# Patient Record
Sex: Male | Born: 1960 | Race: White | Hispanic: No | Marital: Married | Smoking: Never smoker
Health system: Southern US, Community
[De-identification: ages and names within clinical notes are randomized; demographics above are authoritative.]

## PROBLEM LIST (undated history)

## (undated) DIAGNOSIS — I10 Essential (primary) hypertension: Secondary | ICD-10-CM

## (undated) DIAGNOSIS — F0280 Dementia in other diseases classified elsewhere without behavioral disturbance: Secondary | ICD-10-CM

## (undated) DIAGNOSIS — G309 Alzheimer's disease, unspecified: Secondary | ICD-10-CM

## (undated) DIAGNOSIS — F419 Anxiety disorder, unspecified: Secondary | ICD-10-CM

## (undated) HISTORY — DX: Alzheimer's disease, unspecified: G30.9

## (undated) HISTORY — DX: Dementia in other diseases classified elsewhere, unspecified severity, without behavioral disturbance, psychotic disturbance, mood disturbance, and anxiety: F02.80

## (undated) HISTORY — DX: Anxiety disorder, unspecified: F41.9

## (undated) HISTORY — DX: Essential (primary) hypertension: I10

## (undated) HISTORY — PX: PROSTATE SURGERY: SHX751

---

## 1978-01-27 HISTORY — PX: AMPUTATION: SHX166

## 2004-11-10 ENCOUNTER — Ambulatory Visit: Payer: Self-pay | Admitting: General Practice

## 2009-10-30 ENCOUNTER — Ambulatory Visit: Payer: Self-pay | Admitting: Gastroenterology

## 2011-02-21 ENCOUNTER — Ambulatory Visit: Payer: Self-pay | Admitting: General Practice

## 2011-03-19 ENCOUNTER — Other Ambulatory Visit: Payer: Self-pay | Admitting: Neurosurgery

## 2011-03-19 ENCOUNTER — Ambulatory Visit
Admission: RE | Admit: 2011-03-19 | Discharge: 2011-03-19 | Disposition: A | Discharge status: Home / Self Care | Payer: BC Managed Care – PPO | Source: Ambulatory Visit | Attending: Neurosurgery | Admitting: Neurosurgery

## 2011-03-19 DIAGNOSIS — M5000 Cervical disc disorder with myelopathy, unspecified cervical region: Secondary | ICD-10-CM

## 2012-07-19 DIAGNOSIS — I10 Essential (primary) hypertension: Secondary | ICD-10-CM | POA: Insufficient documentation

## 2012-07-19 DIAGNOSIS — R413 Other amnesia: Secondary | ICD-10-CM | POA: Insufficient documentation

## 2013-02-03 DIAGNOSIS — G3184 Mild cognitive impairment, so stated: Secondary | ICD-10-CM | POA: Insufficient documentation

## 2017-11-17 ENCOUNTER — Ambulatory Visit: Payer: BLUE CROSS/BLUE SHIELD | Admitting: Urology

## 2017-11-17 ENCOUNTER — Encounter: Payer: Self-pay | Admitting: Urology

## 2017-11-17 VITALS — BP 142/81 | HR 59 | Ht 73.0 in | Wt 317.3 lb

## 2017-11-17 DIAGNOSIS — N138 Other obstructive and reflux uropathy: Secondary | ICD-10-CM

## 2017-11-17 DIAGNOSIS — N401 Enlarged prostate with lower urinary tract symptoms: Secondary | ICD-10-CM

## 2017-11-17 DIAGNOSIS — R350 Frequency of micturition: Secondary | ICD-10-CM

## 2017-11-17 DIAGNOSIS — N3941 Urge incontinence: Secondary | ICD-10-CM

## 2017-11-17 LAB — BLADDER SCAN AMB NON-IMAGING: Scan Result: 0

## 2017-11-17 MED ORDER — MIRABEGRON ER 25 MG PO TB24: 25.0000 mg | ORAL_TABLET | Freq: Every day | ORAL | 0 refills | Status: DC

## 2017-11-17 NOTE — Progress Notes (Signed)
11/17/2017 3:36 PM   Nathan Schroeder [birth year] [id number]  Referring provider: Gilles Chiquito, MD [address] [place], [place] [address]  Chief Complaint  Patient presents with  . Urinary Frequency    HPI: Patient is a 57 year old Caucasian male with early onset Alzheimer's dementia who is referred by Nona Dell, PA for frequency and voiding small amounts with his wife, Elease Hashimoto.    [name] wife states that as he has Alzheimer's sometimes it is difficult to figure out what the real complaints may be.  She states that she has noted frequency of urination, urgency of urination and a weak urinary stream.  She states that this is been occurring for years.  She has not noted anything that makes it better or worse.  Patient denies any gross hematuria, dysuria or suprapubic/flank pain.  Patient denies any fevers, chills, nausea or vomiting.   His PVR is 0 mL.         PMH: Past Medical History:  Diagnosis Date  . Alzheimer's disease (HCC)   . Anxiety   . 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 Always use your most recent med list.          buPROPion 150 MG 24 hr tablet Commonly known as:  WELLBUTRIN XL TK 1 T PO QAM   donepezil 10 MG tablet Commonly known as:  ARICEPT TAKE 1 TABLET PO ONCE DAILY   lisinopril 20 MG tablet Commonly known as:  PRINIVIL,ZESTRIL TAKE 1 TABLET BY MOUTH ONCE EVERY MORNING   memantine 28 MG Cp24 24 hr capsule Commonly known as:  NAMENDA XR TK 1 C PO QD   mirabegron ER 25 MG Tb24 tablet Commonly known as:  MYRBETRIQ Take 1 tablet (25 mg total) by mouth daily.   oxybutynin 5 MG 24 hr tablet Commonly known as:  DITROPAN-XL TK 1 T PO QD       Allergies: Not on File  Family History: No family history on file.  Social History:  reports that he has never  smoked. He has quit using smokeless tobacco.  His smokeless tobacco use included snuff. He reports that he drinks about 2.0 standard drinks of alcohol per week. His drug history is not on file.  ROS: UROLOGY Frequent Urination?: Yes Hard to postpone urination?: Yes Burning/pain with urination?: No Get up at night to urinate?: No Leakage of urine?: Yes(sometimes) Urine stream starts and stops?: Yes(sometimes) Trouble starting stream?: Yes(sometimes) Do you have to strain to urinate?: Yes(sometimes) Blood in urine?: No Urinary tract infection?: No Sexually transmitted disease?: No Injury to kidneys or bladder?: No Painful intercourse?: No Weak stream?: No Erection problems?: No Penile pain?: No  Gastrointestinal Nausea?: No Vomiting?: No Indigestion/heartburn?: No Diarrhea?: Yes Constipation?: Yes  Constitutional Fever: No Night sweats?: No Weight loss?: No Fatigue?: No  Skin Skin rash/lesions?: No Itching?: No  Eyes Blurred vision?: Yes Double vision?: No  Ears/Nose/Throat Sore throat?: No Sinus problems?: No  Hematologic/Lymphatic Swollen glands?: No Easy bruising?: No  Cardiovascular Leg swelling?: No Chest pain?: No  Respiratory Cough?: No Shortness of breath?: No  Endocrine Excessive thirst?: No  Musculoskeletal Back pain?: No Joint pain?: No  Neurological Headaches?: No Dizziness?: No  Psychologic Depression?: Yes Anxiety?: Yes  Physical Exam: BP (!) 142/81 (  BP Location: Left Arm, Patient Position: Sitting, Cuff Size: Normal)   Pulse (!) 59   Ht 6\' 1"  (1.854 m)   Wt (!) 317 lb 4.8 oz (143.9 kg)   BMI 41.86 kg/m   Constitutional:  Well nourished. Alert and oriented, No acute distress. HEENT: [place] AT, moist mucus membranes.  Trachea midline, no masses. Cardiovascular: No clubbing, cyanosis, or edema. Respiratory: Normal respiratory effort, no increased work of breathing. GI: Abdomen is soft, non tender, non distended, no abdominal  masses. Liver and spleen not palpable.  No hernias appreciated.  Stool sample for occult testing is not indicated.   GU: No CVA tenderness.  No bladder fullness or masses.  Patient with circumcised phallus.  Urethral meatus is patent.  No penile discharge. No penile lesions or rashes. Scrotum without lesions, cysts, rashes and/or edema.  Testicles are located scrotally bilaterally. No masses are appreciated in the testicles. Left and right epididymis are normal. Rectal: Patient with  normal sphincter tone. Anus and perineum without scarring or rashes. No rectal masses are appreciated. Prostate is approximately 55 grams, could only palpate the apex, no  nodules are appreciated.  Skin: No rashes, bruises or suspicious lesions. Lymph: No cervical or inguinal adenopathy. Neurologic: Grossly intact, no focal deficits, moving all 4 extremities. Psychiatric: Normal mood and affect.  Laboratory Data: No results found for: WBC, HGB, HCT, MCV, PLT  No results found for: CREATININE  No results found for: PSA  No results found for: TESTOSTERONE  No results found for: HGBA1C  No results found for: TSH  No results found for: CHOL, HDL, CHOLHDL, VLDL, LDLCALC  No results found for: AST No results found for: ALT No components found for: ALKALINEPHOPHATASE No components found for: BILIRUBINTOTAL  No results found for: ESTRADIOL  Urinalysis No results found for: COLORURINE, APPEARANCEUR, LABSPEC, PHURINE, GLUCOSEU, HGBUR, BILIRUBINUR, KETONESUR, PROTEINUR, UROBILINOGEN, NITRITE, LEUKOCYTESUR  I have reviewed the labs.   Pertinent Imaging: Results for Nathan Schroeder, Nathan Schroeder (MRN [id number]) as of 11/29/2017 21:05  Ref. Range 11/17/2017 15:23  Scan Result Unknown 0    Assessment & Plan:    1. BPH with LUTS Continue conservative management, avoiding bladder irritants and timed voiding's Most bothersome symptoms is/are frequency and urgency RTC in one month for I PSS and PVR  2. Urinary  frequency - Urinalysis, Complete -patient could not provide specimen will collect at return appointment - Bladder Scan (Post Void Residual) in office  3. Urgency Will discontinue the oxybutynin at this time as 1 of its main side effect is cognitive defects Offered medical therapy with beta-3 adrenergic receptor agonist - would like to try the beta-3 adrenergic receptor agonist (Myrbetriq).  Given Myrbetriq 25 mg samples, #28.  I have reviewed with the patient of the side effects of Myrbetriq, such as: elevation in BP, urinary retention and/or HA.   RTC in 3 weeks for PVR and symptom recheck  Return in about 3 weeks (around 12/08/2017) for IPSS and PVR.  These notes generated with voice recognition software. I apologize for typographical errors.  Michiel Cowboy, PA-C  [facility] [address]  [address] [place], [place] [address] [phone]

## 2017-12-08 ENCOUNTER — Encounter: Payer: Self-pay | Admitting: Urology

## 2017-12-08 ENCOUNTER — Ambulatory Visit: Payer: BLUE CROSS/BLUE SHIELD | Admitting: Urology

## 2017-12-08 VITALS — BP 145/84 | HR 53 | Ht 73.0 in | Wt 319.8 lb

## 2017-12-08 DIAGNOSIS — R350 Frequency of micturition: Secondary | ICD-10-CM

## 2017-12-08 DIAGNOSIS — N401 Enlarged prostate with lower urinary tract symptoms: Secondary | ICD-10-CM | POA: Diagnosis not present

## 2017-12-08 DIAGNOSIS — N3941 Urge incontinence: Secondary | ICD-10-CM | POA: Diagnosis not present

## 2017-12-08 DIAGNOSIS — N138 Other obstructive and reflux uropathy: Secondary | ICD-10-CM

## 2017-12-08 LAB — BLADDER SCAN AMB NON-IMAGING: Scan Result: 11

## 2017-12-08 NOTE — Progress Notes (Signed)
12/08/2017 3:08 PM   Nathan Schroeder 03/03/1960 [id number]  Referring provider: No referring provider defined for this encounter.  Chief Complaint  Patient presents with  . Follow-up    HPI: Patient is a 57 year old Caucasian male with BPH with LU TS, urgency and frequency who presents today for a three week follow up after a trial of Myrbetriq.    Background history Patient is a 57 year old Caucasian male with early onset Alzheimer's dementia who is referred by Nathan Dellonald Smith, PA for frequency and voiding small amounts with his wife, Nathan Hashimotoatricia.  [name] wife states that as he has Alzheimer's sometimes it is difficult to figure out what the real complaints may be.  She states that she has noted frequency of urination, urgency of urination and a weak urinary stream.  She states that this is been occurring for years.  She has not noted anything that makes it better or worse.  Patient denies any gross hematuria, dysuria or suprapubic/flank pain.  Patient denies any fevers, chills, nausea or vomiting.  His PVR is 0 mL.    Today, his UA is negative.    PVR: 11 mL   Previous PVR: 0 mL  Major complaint(s):  Frequency.  His wife states that they have not noticed any change in his urinary symptoms since the start of the Myrbetriq samples.  Denies any dysuria, hematuria or suprapubic pain.   Denies any recent fevers, chills, nausea or vomiting.  Wife feels that the frequency with urination is due to anxiety as he does not exhibit the symptoms as much as what he is not familiar surroundings.  She states that whenever they visit a new place, he has to visit the restroom constantly.     IPSS    Row Name 12/08/17 1400         International Prostate Symptom Score   How often have you had to urinate less than every two hours?  Almost always     How often have you found it difficult to postpone urination?  Not at All     How many times did you typically get up at night to urinate?  1 Time     Total IPSS Score  6        Score:  1-7 Mild 8-19 Moderate 20-35 Severe        PMH: Past Medical History:  Diagnosis Date  . Alzheimer's disease (HCC)   . Anxiety   . 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 Always use your most recent med list.          buPROPion 150 MG 24 hr tablet Commonly known as:  WELLBUTRIN XL TK 1 T PO QAM   donepezil 10 MG tablet Commonly known as:  ARICEPT TAKE 1 TABLET PO ONCE DAILY   lisinopril 20 MG tablet Commonly known as:  PRINIVIL,ZESTRIL TAKE 1 TABLET BY MOUTH ONCE EVERY MORNING   memantine 28 MG Cp24 24 hr capsule Commonly known as:  NAMENDA XR TK 1 C PO QD   mirabegron ER 25 MG Tb24 tablet Commonly known as:  MYRBETRIQ Take 1 tablet (25 mg total) by mouth daily.       Allergies: Not on File  Family History: No family history on  file.  Social History:  reports that he has never smoked. He has quit using smokeless tobacco.  His smokeless tobacco use included snuff. He reports that he drinks about 2.0 standard drinks of alcohol per week. His drug history is not on file.  ROS: UROLOGY Frequent Urination?: Yes Hard to postpone urination?: No Burning/pain with urination?: No Get up at night to urinate?: No Leakage of urine?: No Urine stream starts and stops?: No Trouble starting stream?: No Do you have to strain to urinate?: No Blood in urine?: No Urinary tract infection?: No Sexually transmitted disease?: No Injury to kidneys or bladder?: No Painful intercourse?: No Weak stream?: No Erection problems?: No Penile pain?: No  Gastrointestinal Nausea?: No Vomiting?: No Indigestion/heartburn?: No Diarrhea?: Yes Constipation?: No  Constitutional Fever: No Night sweats?: No Weight loss?: No Fatigue?: No  Skin Skin rash/lesions?:  No Itching?: No  Eyes Blurred vision?: No Double vision?: No  Ears/Nose/Throat Sore throat?: No Sinus problems?: No  Hematologic/Lymphatic Swollen glands?: No Easy bruising?: No  Cardiovascular Leg swelling?: No Chest pain?: No  Respiratory Cough?: No Shortness of breath?: No  Endocrine Excessive thirst?: No  Musculoskeletal Back pain?: No Joint pain?: No  Neurological Headaches?: No Dizziness?: No  Psychologic Depression?: No Anxiety?: No  Physical Exam: BP (!) 145/84 (BP Location: Left Arm, Patient Position: Sitting, Cuff Size: Large)   Pulse (!) 53   Ht 6\' 1"  (1.854 m)   Wt (!) 319 lb 12.8 oz (145.1 kg)   BMI 42.19 kg/m   Constitutional: Well nourished. Alert and oriented, No acute distress. HEENT: [place] AT, moist mucus membranes. Trachea midline, no masses. Cardiovascular: No clubbing, cyanosis, or edema. Respiratory: Normal respiratory effort, no increased work of breathing. Skin: No rashes, bruises or suspicious lesions. Lymph: No cervical or inguinal adenopathy. Neurologic: Grossly intact, no focal deficits, moving all 4 extremities. Psychiatric: Normal mood and affect.  Laboratory Data: No results found for: WBC, HGB, HCT, MCV, PLT  No results found for: CREATININE  No results found for: PSA  No results found for: TESTOSTERONE  No results found for: HGBA1C  No results found for: TSH  No results found for: CHOL, HDL, CHOLHDL, VLDL, LDLCALC  No results found for: AST No results found for: ALT No components found for: ALKALINEPHOPHATASE No components found for: BILIRUBINTOTAL  No results found for: ESTRADIOL  Urinalysis No results found for: COLORURINE, APPEARANCEUR, LABSPEC, PHURINE, GLUCOSEU, HGBUR, BILIRUBINUR, KETONESUR, PROTEINUR, UROBILINOGEN, NITRITE, LEUKOCYTESUR  Schroeder have reviewed the labs.   Pertinent Imaging: Results for Nathan Schroeder (MRN [id number]) as of 12/08/2017 15:13  Ref. Range 12/08/2017 14:16  Scan Result  Unknown 11    Assessment & Plan:    1. BPH with LU TS Continue conservative management, avoiding bladder irritants and timed voiding's Most bothersome symptoms is/are frequency and urgency - no improvement with Myrbetriq Will schedule cystoscopically to rule out any strictures or anatomical variant that may contribute to his urinary urgency/frequency Schroeder have explained to the patient that they will  be scheduled for a cystoscopy in our office to evaluate their bladder.  The cystoscopy consists of passing a tube with a lens up through their urethra and into their urinary bladder.   We will inject the urethra with a lidocaine gel prior to introducing the cystoscope to help with any discomfort during the procedure.   After the procedure, they might experience blood in the urine and discomfort with urination.  This will abate after the first few voids.  Schroeder have  encouraged  the patient to increase water intake  during this time.  Patient denies any allergies to lidocaine.    2. Urinary frequency - Urinalysis - negative we will send for culture to rule out indolent infection - Bladder Scan (Post Void Residual) in office  3. Urgency See above  Return for cystoscopy for refractory urgency .  These notes generated with voice recognition software. Schroeder apologize for typographical errors.  Michiel Cowboy, PA-C  [facility] [address]  [address] [place], [place] [address] [phone]

## 2017-12-09 LAB — URINALYSIS, COMPLETE
Bilirubin, UA: NEGATIVE
Glucose, UA: NEGATIVE
Ketones, UA: NEGATIVE
LEUKOCYTES UA: NEGATIVE
Nitrite, UA: NEGATIVE
Protein, UA: NEGATIVE
RBC, UA: NEGATIVE
Specific Gravity, UA: 1.025 (ref 1.005–1.030)
Urobilinogen, Ur: 0.2 mg/dL (ref 0.2–1.0)
pH, UA: 5.5 (ref 5.0–7.5)

## 2017-12-09 LAB — MICROSCOPIC EXAMINATION
BACTERIA UA: NONE SEEN
EPITHELIAL CELLS (NON RENAL): NONE SEEN /HPF (ref 0–10)
WBC UA: NONE SEEN /HPF (ref 0–5)

## 2017-12-22 ENCOUNTER — Ambulatory Visit (INDEPENDENT_AMBULATORY_CARE_PROVIDER_SITE_OTHER): Payer: BLUE CROSS/BLUE SHIELD | Admitting: Urology

## 2017-12-22 ENCOUNTER — Encounter: Payer: Self-pay | Admitting: Urology

## 2017-12-22 VITALS — BP 138/84 | HR 61 | Ht 73.0 in | Wt 320.4 lb

## 2017-12-22 DIAGNOSIS — R350 Frequency of micturition: Secondary | ICD-10-CM

## 2017-12-22 LAB — URINALYSIS, COMPLETE
BILIRUBIN UA: NEGATIVE
GLUCOSE, UA: NEGATIVE
Ketones, UA: NEGATIVE
Leukocytes, UA: NEGATIVE
Nitrite, UA: NEGATIVE
PROTEIN UA: NEGATIVE
RBC UA: NEGATIVE
UUROB: 0.2 mg/dL (ref 0.2–1.0)
pH, UA: 5.5 (ref 5.0–7.5)

## 2017-12-22 LAB — MICROSCOPIC EXAMINATION
EPITHELIAL CELLS (NON RENAL): NONE SEEN /HPF (ref 0–10)
WBC UA: NONE SEEN /HPF (ref 0–5)

## 2017-12-22 MED ORDER — LIDOCAINE HCL URETHRAL/MUCOSAL 2 % EX GEL: 1.0000 "application " | Freq: Once | CUTANEOUS | Status: DC

## 2017-12-22 MED ORDER — MIRABEGRON ER 50 MG PO TB24: 50.0000 mg | ORAL_TABLET | Freq: Every day | ORAL | 0 refills | Status: DC

## 2017-12-22 NOTE — Progress Notes (Signed)
   12/22/17  CC: No chief complaint on file.   HPI: 57 year old male with early onset Alzheimer's in urinary frequency and urgency.  Refer to [facility]annon McGowan's previous notes.  He remains on Myrbetriq 25 mg with slight improvement in his symptoms.  There were no vitals taken for this visit. NED. A&Ox3.   No respiratory distress   Abd soft, NT, ND Normal phallus with bilateral descended testicles  Cystoscopy Procedure Note  Patient identification was confirmed, informed consent was obtained, and patient was prepped using Betadine solution.  Lidocaine jelly was administered per urethral meatus.     Pre-Procedure: - Inspection reveals a normal caliber ureteral meatus.  Procedure: The flexible cystoscope was introduced without difficulty - No urethral strictures/lesions are present. - Nonocclusive prostate with a short prostatic urethra - Normal bladder neck - Bilateral ureteral orifices identified - Bladder mucosa  reveals no ulcers, tumors, or lesions - No bladder stones - No trabeculation  Retroflexion shows no abnormalities.   Post-Procedure: - Patient tolerated the procedure well  Assessment/ Plan: No significant abnormalities identified on cystoscopy.  His wife thinks majority of his symptoms are related to anxiety.  Would avoid anticholinergic medication in this patient with early onset Alzheimer's.  Will increase the Myrbetriq to 50 mg.  He was given samples and an Rx was sent to his pharmacy.   Riki AltesScott C Samarah Hogle, MD

## 2018-06-22 ENCOUNTER — Ambulatory Visit: Payer: BLUE CROSS/BLUE SHIELD | Admitting: Urology

## 2018-07-01 NOTE — Progress Notes (Signed)
07/02/2018 9:45 AM   Nathan Schroeder 10/27/1960 [id number]  Referring provider: No referring provider defined for this encounter.  Chief Complaint  Patient presents with  . Urinary Frequency    Follow up    HPI: Patient is a 58 year old Caucasian male with BPH with LU TS, urgency and frequency who presents today for a follow up.      Background history Patient is a 58 year old Caucasian male with early onset Alzheimer's dementia who is referred by Nathan Dellonald Smith, PA for frequency and voiding small amounts with his wife, Nathan Schroeder.  [name] wife states that as he has Alzheimer's sometimes it is difficult to figure out what the real complaints may be.  She states that she has noted frequency of urination, urgency of urination and a weak urinary stream.  She states that this is been occurring for years.  She has not noted anything that makes it better or worse.    PVR: 10 mL   Previous PVR: 11 mL  Major complaint(s):  Frequency.  His wife states that they have not noticed any change in his urinary symptoms since the start of the Myrbetriq samples.  Denies any dysuria, hematuria or suprapubic pain.   Denies any recent fevers, chills, nausea or vomiting.  Cystoscopy was NED.    Wife feels that the frequency with urination is due to anxiety as he does not exhibit the symptoms as much as what he is not familiar surroundings.  She states that whenever they visit a new place, he has to visit the restroom constantly.    He was given Myrbetriq 50 mg daily after his cystoscopy appointment in November 2019.  His wife states he is no longer on the Myrbetriq and his urinary symptoms have greatly improved on their own.   His PVR today is 10 mL  Patient denies any gross hematuria, dysuria or suprapubic/flank pain.  Patient denies any fevers, chills, nausea or vomiting.   PMH: Past Medical History:  Diagnosis Date  . Alzheimer's disease (HCC)   . Anxiety   . 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 If you have any questions, ask your nurse or doctor.        STOP taking these medications   mirabegron ER 50 MG Tb24 tablet Commonly known as:  MYRBETRIQ Stopped by:  Nathan Schroeder   Urea 20 Intensive Hydrating 20 % cream Generic drug:  urea Stopped by:  Nathan Schroeder     TAKE these medications   buPROPion 300 MG 24 hr tablet Commonly known as:  WELLBUTRIN XL What changed:  Another medication with the same name was removed. Continue taking this medication, and follow the directions you see here. Changed by:  Nathan Schroeder   donepezil 10 MG tablet Commonly known as:  ARICEPT TAKE 1 TABLET PO ONCE DAILY   lisinopril 20 MG tablet Commonly known as:  ZESTRIL TAKE 1 TABLET BY MOUTH ONCE EVERY MORNING   memantine 28 MG Cp24 24 hr capsule Commonly known as:  NAMENDA XR TK 1 C PO QD       Allergies: No Known Allergies  Family History: No family history on file.  Social History:  reports that he has never smoked. He has quit  using smokeless tobacco.  His smokeless tobacco use included snuff. He reports current alcohol use of about 2.0 standard drinks of alcohol per week. No history on file for drug.  ROS: UROLOGY Frequent Urination?: No Hard to postpone urination?: No Burning/pain with urination?: No Get up at night to urinate?: No Leakage of urine?: No Urine stream starts and stops?: No Trouble starting stream?: No Do you have to strain to urinate?: No Blood in urine?: No Urinary tract infection?: No Sexually transmitted disease?: No Injury to kidneys or bladder?: No Painful intercourse?: No Weak stream?: No Erection problems?: No Penile pain?: No  Gastrointestinal Nausea?: No Vomiting?: No Indigestion/heartburn?: No Diarrhea?: No Constipation?: No   Constitutional Fever: No Night sweats?: No Weight loss?: No Fatigue?: No  Skin Skin rash/lesions?: No Itching?: No  Eyes Blurred vision?: No Double vision?: No  Ears/Nose/Throat Sore throat?: No Sinus problems?: No  Hematologic/Lymphatic Swollen glands?: No Easy bruising?: No  Cardiovascular Leg swelling?: No Chest pain?: No  Respiratory Cough?: No Shortness of breath?: No  Endocrine Excessive thirst?: No  Musculoskeletal Back pain?: No Joint pain?: No  Neurological Headaches?: No Dizziness?: No  Psychologic Depression?: No Anxiety?: No  Physical Exam: BP 136/74   Ht 6\' 1"  (1.854 m)   Wt (!) 321 lb (145.6 kg)   BMI 42.35 kg/m   Constitutional:  Well nourished. Alert and oriented, No acute distress. HEENT: [place] AT, moist mucus membranes.  Trachea midline, no masses. Cardiovascular: No clubbing, cyanosis, or edema. Respiratory: Normal respiratory effort, no increased work of breathing. Neurologic: Grossly intact, no focal deficits, moving all 4 extremities. Psychiatric: Normal mood and affect.  Laboratory Data: No results found for: WBC, HGB, HCT, MCV, PLT  No results found for: CREATININE  No results found for: PSA  No results found for: TESTOSTERONE  No results found for: HGBA1C  No results found for: TSH  No results found for: CHOL, HDL, CHOLHDL, VLDL, LDLCALC  No results found for: AST No results found for: ALT No components found for: ALKALINEPHOPHATASE No components found for: BILIRUBINTOTAL  No results found for: ESTRADIOL  Urinalysis    Component Value Date/Time   APPEARANCEUR Clear 12/22/2017 1511   GLUCOSEU Negative 12/22/2017 1511   BILIRUBINUR Negative 12/22/2017 1511   PROTEINUR Negative 12/22/2017 1511   NITRITE Negative 12/22/2017 1511   LEUKOCYTESUR Negative 12/22/2017 1511    I have reviewed the labs.   Pertinent Imaging: Results for Nathan Schroeder, Nathan Schroeder (Nathan Schroeder) as of 07/05/2018 09:50  Ref. Range 07/02/2018  10:24  Scan Result Unknown 56ml   Assessment & Plan:    1. BPH with LU TS Nathan Schroeder does not have any urinary complaints at this visit PVR is low  2. Urinary frequency Bladder Scan (Post Void Residual) in office Resolved  3. Urgency See above  Return if symptoms worsen or fail to improve.  These notes generated with voice recognition software. I apologize for typographical errors.  Michiel Cowboy, Schroeder  [facility] [address]  [address] [place], [place] [address] [phone]

## 2018-07-02 ENCOUNTER — Other Ambulatory Visit: Payer: Self-pay

## 2018-07-02 ENCOUNTER — Ambulatory Visit: Payer: BC Managed Care – PPO | Admitting: Urology

## 2018-07-02 ENCOUNTER — Encounter: Payer: Self-pay | Admitting: Urology

## 2018-07-02 VITALS — BP 136/74 | Ht 73.0 in | Wt 321.0 lb

## 2018-07-02 DIAGNOSIS — R350 Frequency of micturition: Secondary | ICD-10-CM

## 2018-07-02 DIAGNOSIS — N401 Enlarged prostate with lower urinary tract symptoms: Secondary | ICD-10-CM | POA: Diagnosis not present

## 2018-07-02 DIAGNOSIS — N3941 Urge incontinence: Secondary | ICD-10-CM

## 2018-07-02 DIAGNOSIS — N138 Other obstructive and reflux uropathy: Secondary | ICD-10-CM

## 2018-07-02 LAB — BLADDER SCAN AMB NON-IMAGING

## 2018-09-22 DIAGNOSIS — Z79899 Other long term (current) drug therapy: Secondary | ICD-10-CM | POA: Diagnosis not present

## 2018-09-22 DIAGNOSIS — R69 Illness, unspecified: Secondary | ICD-10-CM | POA: Diagnosis not present

## 2018-09-22 DIAGNOSIS — F0390 Unspecified dementia without behavioral disturbance: Secondary | ICD-10-CM | POA: Diagnosis not present

## 2018-09-22 DIAGNOSIS — E569 Vitamin deficiency, unspecified: Secondary | ICD-10-CM | POA: Diagnosis not present

## 2018-09-22 DIAGNOSIS — R413 Other amnesia: Secondary | ICD-10-CM | POA: Diagnosis not present

## 2018-10-07 ENCOUNTER — Other Ambulatory Visit: Payer: Self-pay

## 2018-10-07 MED ORDER — UREA 20 % EX CREA: TOPICAL_CREAM | CUTANEOUS | 0 refills | Status: DC

## 2018-10-28 ENCOUNTER — Ambulatory Visit: Payer: Self-pay

## 2018-10-28 DIAGNOSIS — Z23 Encounter for immunization: Secondary | ICD-10-CM

## 2018-11-25 ENCOUNTER — Other Ambulatory Visit: Payer: Self-pay

## 2018-11-25 DIAGNOSIS — R234 Changes in skin texture: Secondary | ICD-10-CM

## 2018-11-26 MED ORDER — UREA 20 % EX CREA: TOPICAL_CREAM | CUTANEOUS | 1 refills | Status: DC

## 2019-01-07 ENCOUNTER — Other Ambulatory Visit: Payer: Self-pay

## 2019-01-07 ENCOUNTER — Other Ambulatory Visit: Payer: Self-pay | Admitting: Physician Assistant

## 2019-01-07 DIAGNOSIS — I10 Essential (primary) hypertension: Secondary | ICD-10-CM

## 2019-01-07 MED ORDER — LISINOPRIL 20 MG PO TABS: 20.0000 mg | ORAL_TABLET | Freq: Every day | ORAL | 0 refills | Status: DC

## 2019-01-25 ENCOUNTER — Ambulatory Visit: Payer: Managed Care, Other (non HMO) | Attending: Internal Medicine

## 2019-01-25 DIAGNOSIS — Z20828 Contact with and (suspected) exposure to other viral communicable diseases: Secondary | ICD-10-CM | POA: Diagnosis not present

## 2019-01-25 DIAGNOSIS — Z20822 Contact with and (suspected) exposure to covid-19: Secondary | ICD-10-CM

## 2019-01-26 LAB — NOVEL CORONAVIRUS, NAA: SARS-CoV-2, NAA: NOT DETECTED

## 2019-01-31 ENCOUNTER — Other Ambulatory Visit: Payer: Managed Care, Other (non HMO)

## 2019-02-28 ENCOUNTER — Other Ambulatory Visit: Payer: Self-pay

## 2019-02-28 ENCOUNTER — Other Ambulatory Visit: Payer: Self-pay | Admitting: Physician Assistant

## 2019-02-28 ENCOUNTER — Ambulatory Visit: Payer: Managed Care, Other (non HMO) | Attending: Internal Medicine

## 2019-02-28 DIAGNOSIS — U071 COVID-19: Secondary | ICD-10-CM

## 2019-02-28 DIAGNOSIS — R234 Changes in skin texture: Secondary | ICD-10-CM

## 2019-02-28 DIAGNOSIS — Z20822 Contact with and (suspected) exposure to covid-19: Secondary | ICD-10-CM

## 2019-02-28 MED ORDER — UREA 20 % EX CREA: TOPICAL_CREAM | CUTANEOUS | 1 refills | Status: DC

## 2019-03-01 LAB — NOVEL CORONAVIRUS, NAA: SARS-CoV-2, NAA: NOT DETECTED

## 2019-03-28 ENCOUNTER — Other Ambulatory Visit: Payer: Self-pay

## 2019-03-28 DIAGNOSIS — I10 Essential (primary) hypertension: Secondary | ICD-10-CM

## 2019-03-29 MED ORDER — LISINOPRIL 20 MG PO TABS: 20.0000 mg | ORAL_TABLET | Freq: Every day | ORAL | 0 refills | Status: DC

## 2019-04-05 NOTE — Progress Notes (Signed)
Patient comes in today for pre physical labs and EKG. Patient is scheduled with Albina Billet, PA-C on 04/13/19.

## 2019-04-06 ENCOUNTER — Other Ambulatory Visit: Payer: Self-pay

## 2019-04-06 ENCOUNTER — Ambulatory Visit: Payer: Self-pay

## 2019-04-06 DIAGNOSIS — Z0000 Encounter for general adult medical examination without abnormal findings: Secondary | ICD-10-CM

## 2019-04-06 NOTE — Progress Notes (Signed)
Presents for labs & EKG accompanied by wife Alexia Freestone Tillar).  EKG reviewed by Durward Parcel, PA-C (Interim Provider).  AMD

## 2019-04-07 LAB — CMP12+LP+TP+TSH+6AC+PSA+CBC…
ALT: 34 IU/L (ref 0–44)
AST: 32 IU/L (ref 0–40)
Albumin/Globulin Ratio: 1.4 (ref 1.2–2.2)
Albumin: 4.4 g/dL (ref 3.8–4.9)
Alkaline Phosphatase: 69 IU/L (ref 39–117)
BUN/Creatinine Ratio: 16 (ref 10–24)
BUN: 14 mg/dL (ref 8–27)
Basophils Absolute: 0 10*3/uL (ref 0.0–0.2)
Basos: 0 %
Bilirubin Total: 0.9 mg/dL (ref 0.0–1.2)
Calcium: 9.1 mg/dL (ref 8.6–10.2)
Chloride: 102 mmol/L (ref 96–106)
Chol/HDL Ratio: 4.7 ratio (ref 0.0–5.0)
Cholesterol, Total: 199 mg/dL (ref 100–199)
Creatinine, Ser: 0.89 mg/dL (ref 0.76–1.27)
EOS (ABSOLUTE): 0.1 10*3/uL (ref 0.0–0.4)
Eos: 1 %
Estimated CHD Risk: 1 times avg. (ref 0.0–1.0)
Free Thyroxine Index: 1.7 (ref 1.2–4.9)
GFR calc Af Amer: 107 mL/min/{1.73_m2} (ref >59)
GFR calc non Af Amer: 93 mL/min/{1.73_m2} (ref >59)
GGT: 22 IU/L (ref 0–65)
Globulin, Total: 3.1 g/dL (ref 1.5–4.5)
Glucose: 99 mg/dL (ref 65–99)
HDL: 42 mg/dL (ref >39)
Hematocrit: 45.4 % (ref 37.5–51.0)
Hemoglobin: 16.2 g/dL (ref 13.0–17.7)
Immature Grans (Abs): 0 10*3/uL (ref 0.0–0.1)
Immature Granulocytes: 0 %
Iron: 219 ug/dL — ABNORMAL HIGH (ref 38–169)
LDH: 185 IU/L (ref 121–224)
LDL Chol Calc (NIH): 125 mg/dL — ABNORMAL HIGH (ref 0–99)
Lymphocytes Absolute: 1.9 10*3/uL (ref 0.7–3.1)
Lymphs: 39 %
MCH: 35.4 pg — ABNORMAL HIGH (ref 26.6–33.0)
MCHC: 35.7 g/dL (ref 31.5–35.7)
MCV: 99 fL — ABNORMAL HIGH (ref 79–97)
Monocytes Absolute: 0.5 10*3/uL (ref 0.1–0.9)
Monocytes: 10 %
Neutrophils Absolute: 2.4 10*3/uL (ref 1.4–7.0)
Neutrophils: 50 %
Phosphorus: 3.5 mg/dL (ref 2.8–4.1)
Platelets: 173 10*3/uL (ref 150–450)
Potassium: 4.2 mmol/L (ref 3.5–5.2)
Prostate Specific Ag, Serum: 0.2 ng/mL (ref 0.0–4.0)
RBC: 4.57 x10E6/uL (ref 4.14–5.80)
RDW: 12.1 % (ref 11.6–15.4)
Sodium: 138 mmol/L (ref 134–144)
T3 Uptake Ratio: 25 % (ref 24–39)
T4, Total: 6.8 ug/dL (ref 4.5–12.0)
TSH: 3.06 u[IU]/mL (ref 0.450–4.500)
Total Protein: 7.5 g/dL (ref 6.0–8.5)
Triglycerides: 182 mg/dL — ABNORMAL HIGH (ref 0–149)
Uric Acid: 7.2 mg/dL (ref 3.8–8.4)
VLDL Cholesterol Cal: 32 mg/dL (ref 5–40)
WBC: 4.9 10*3/uL (ref 3.4–10.8)

## 2019-04-13 ENCOUNTER — Ambulatory Visit: Payer: Self-pay | Admitting: Registered Nurse

## 2019-04-13 ENCOUNTER — Encounter: Payer: Self-pay | Admitting: Registered Nurse

## 2019-04-13 ENCOUNTER — Other Ambulatory Visit: Payer: Self-pay

## 2019-04-13 VITALS — BP 151/91 | HR 72 | Temp 98.2°F | Resp 16 | Ht 74.0 in | Wt 321.0 lb

## 2019-04-13 DIAGNOSIS — E785 Hyperlipidemia, unspecified: Secondary | ICD-10-CM

## 2019-04-13 DIAGNOSIS — K439 Ventral hernia without obstruction or gangrene: Secondary | ICD-10-CM | POA: Insufficient documentation

## 2019-04-13 DIAGNOSIS — R350 Frequency of micturition: Secondary | ICD-10-CM | POA: Insufficient documentation

## 2019-04-13 DIAGNOSIS — Z0000 Encounter for general adult medical examination without abnormal findings: Secondary | ICD-10-CM

## 2019-04-13 DIAGNOSIS — Z6841 Body Mass Index (BMI) 40.0 and over, adult: Secondary | ICD-10-CM | POA: Insufficient documentation

## 2019-04-13 DIAGNOSIS — G3184 Mild cognitive impairment, so stated: Secondary | ICD-10-CM

## 2019-04-13 DIAGNOSIS — R2243 Localized swelling, mass and lump, lower limb, bilateral: Secondary | ICD-10-CM

## 2019-04-13 DIAGNOSIS — B353 Tinea pedis: Secondary | ICD-10-CM

## 2019-04-13 DIAGNOSIS — I10 Essential (primary) hypertension: Secondary | ICD-10-CM

## 2019-04-13 LAB — POCT URINALYSIS DIPSTICK
Bilirubin, UA: NEGATIVE
Blood, UA: NEGATIVE
Glucose, UA: NEGATIVE
Ketones, UA: NEGATIVE
Leukocytes, UA: NEGATIVE
Nitrite, UA: NEGATIVE
Protein, UA: NEGATIVE
Spec Grav, UA: >=1.03 — AB (ref 1.010–1.025)
Urobilinogen, UA: 0.2 E.U./dL
pH, UA: 5.5 (ref 5.0–8.0)

## 2019-04-13 NOTE — Progress Notes (Signed)
Present for physical accompanied by spouse, [employer].  Had 1st Covid vaccine last week. Second injection scheduled for 04/25/29/  Very anxious today.    Appt Monday at [facility].  Hasn't been since 08/2018.  See Dr. Lauretta Chester.  AMD

## 2019-04-13 NOTE — Progress Notes (Signed)
Subjective:    Patient ID: Nathan Schroeder, male    DOB: 1960-09-15, 59 y.o.   MRN: [id number]  58y/o married male established patient here with spouse patty for annual physical.  She had reviewed my chart notes for labs prior to appt.  She stated he drinks bottled aquafina and another bottled spring water and their home on city water.  Centrum silver mens vitamin did not show iron on label when she checked.  He typically eats out meals once to twice a day.  [place] egg/sausage/cheese biscuit, diet pepsi his preferred drink.  Varies for other meals chinese/salads.  Typically apple, banana and peanut butter sandwich every day also.  Spouse has noticed increased urination since stopping his bladder medication but all tests have been normal (scoped and labs).  Ditropan stopped as can worsen dementia per neurology.  Decreased activity the past year due to covid and weight gain noted as he has good appetite.  Trying to get him into dementia day program but currently closed down due to covid hoping will reopen soon.  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  Last physical with Dr Despina Arias weight 305lbs BP 140/90  Has had first covid vaccination and second one scheduled later this month.  Keeping him to consistently wear mask difficult per spouse along with social distancing and hand washing.  Patient with cognitive impairment/worsening over the past year.  She thinks his medications need adjustment.  Neurology appt pending follow up next week.  Foot rash better with cream PA Smith prescribed using every day.  Skin was very dry and flaky.  He wears same pair of shoes every day.    Per [facility] [place] paper chart obesity, hypertension, dementia dx 2015 and family history dementia, erectile dysfunction, plantar fasciitis, left shoulder bursitis, left knee pain, sleep apnea, last colonoscopy 2011 next due 10 years  Previous  Rx lisiniopril  2007-2020, ditropan xl  2016 to 2019 neurology discontinued, aricept  daily, namenda, aspirin , mobic  2018, urea cream 20% 2019 to present; myrbetriq 2019 urology, vitamin B12. osteobioflex daily, cialis  2015  Urology referral BPH with LUTS 2019 had bladder scan and PVR zero; brain MRI 2014 for memory loss age 78 diffuse sulcal prominence greater than expected advanced atrophy for age/global cerebral atrophy greater than expected for patient' age without lobar predilection 2013 had anterior cervical diskectomy     Review of Systems  Constitutional: Negative for activity change, appetite change, diaphoresis and fever.  HENT: Negative for mouth sores, nosebleeds, trouble swallowing and voice change.   Eyes: Negative for discharge, redness and itching.  Respiratory: Negative for cough, choking, shortness of breath, wheezing and stridor.   Cardiovascular: Negative for leg swelling.  Gastrointestinal: Negative for blood in stool, diarrhea and vomiting.  Endocrine: Positive for polyuria. Negative for polydipsia and polyphagia.  Genitourinary: Positive for frequency. Negative for difficulty urinating.  Musculoskeletal: Negative for gait problem, neck pain and neck stiffness.  Skin: Positive for rash.  Neurological: Negative for tremors, seizures, syncope, facial asymmetry, speech difficulty and weakness.  Hematological: Negative for adenopathy. Does not bruise/bleed easily.  Psychiatric/Behavioral: Positive for confusion. Negative for agitation, self-injury and sleep disturbance. The patient is nervous/anxious.        Objective:   Physical Exam Vitals and nursing note reviewed.  Constitutional:      General: He is awake. He is not in acute distress.  Appearance: Normal appearance. He is well-developed and well-groomed. He is obese. He is not ill-appearing, toxic-appearing or diaphoretic.  HENT:     Head: Normocephalic and atraumatic.     Jaw: There is  normal jaw occlusion.     Salivary Glands: Right salivary gland is not diffusely enlarged or tender. Left salivary gland is not diffusely enlarged or tender.     Right Ear: Hearing and external ear normal.     Left Ear: Hearing and external ear normal.     Nose: Nose normal. No nasal deformity, septal deviation, signs of injury, laceration, nasal tenderness, mucosal edema, congestion or rhinorrhea.     Right Nostril: No epistaxis.     Right Sinus: No maxillary sinus tenderness or frontal sinus tenderness.     Left Sinus: No maxillary sinus tenderness or frontal sinus tenderness.     Mouth/Throat:     Lips: Pink. No lesions.     Mouth: Mucous membranes are moist. No injury, lacerations, oral lesions or angioedema.     Dentition: No gingival swelling, dental abscesses or gum lesions.     Tongue: No lesions. Tongue does not deviate from midline.     Palate: No mass and lesions.     Pharynx: Oropharynx is clear. Uvula midline. No pharyngeal swelling, oropharyngeal exudate, posterior oropharyngeal erythema or uvula swelling.     Tonsils: No tonsillar exudate. 0 on the right. 0 on the left.  Eyes:     General: Lids are normal. Gaze aligned appropriately. No allergic shiner, visual field deficit or scleral icterus.       Right eye: No discharge.        Left eye: No discharge.     Extraocular Movements: Extraocular movements intact.     Conjunctiva/sclera: Conjunctivae normal.     Pupils: Pupils are equal, round, and reactive to light.  Neck:     Thyroid: No thyromegaly.     Vascular: No carotid bruit.     Trachea: Trachea and phonation normal.  Cardiovascular:     Rate and Rhythm: Normal rate and regular rhythm.     Pulses: Normal pulses.          Radial pulses are 2+ on the right side and 2+ on the left side.       Dorsalis pedis pulses are 2+ on the right side and 2+ on the left side.       Posterior tibial pulses are 2+ on the right side and 2+ on the left side.     Heart sounds: Normal  heart sounds, S1 normal and S2 normal. Heart sounds not distant. No murmur. No friction rub. No gallop.   Pulmonary:     Effort: Pulmonary effort is normal. No respiratory distress.     Breath sounds: Normal breath sounds and air entry. No stridor, decreased air movement or transmitted upper airway sounds. No decreased breath sounds, wheezing, rhonchi or rales.     Comments: Initially patient wearing cloth mask due to covid 19 pandemic but then removed in exam room; no cough observed in exam room; speech without difficulty Abdominal:     General: Abdomen is flat. Bowel sounds are decreased. There is no distension or abdominal bruit.     Palpations: Abdomen is soft. There is no shifting dullness, fluid wave, hepatomegaly, splenomegaly, mass or pulsatile mass.     Tenderness: There is no abdominal tenderness. There is no guarding.     Hernia: A hernia is present. Hernia is present in the ventral  area. There is no hernia in the umbilical area.     Comments: Midline proximal bulge epigastric with abdominal crunch resolves with sitting/standing; patient not guarding or saying it hurts when palpated 4x7cm; hypoactive bowel sounds x 4quads; dull to percussion x 4 quads  Musculoskeletal:        General: Swelling and signs of injury present. No tenderness or deformity. Normal range of motion.     Right shoulder: Normal.     Left shoulder: Normal.     Right elbow: Normal.     Left elbow: Normal.     Right hand: Normal.     Left hand: Normal.     Cervical back: Normal, normal range of motion and neck supple. No swelling, edema, deformity, erythema, signs of trauma, lacerations, rigidity, spasms, torticollis or crepitus. Normal range of motion.     Thoracic back: Normal.     Lumbar back: Normal.     Right hip: Normal.     Left hip: Normal.     Right knee: Normal.     Left knee: Normal.     Right lower leg: Swelling present. No deformity, lacerations or tenderness. 1+ Pitting Edema present.     Left  lower leg: Swelling present. No deformity, lacerations or tenderness. 1+ Pitting Edema present.     Right ankle: Swelling present. No ecchymosis or lacerations. No tenderness. Normal range of motion. Normal pulse.     Right Achilles Tendon: Normal.     Left ankle: Swelling present. No ecchymosis or lacerations. No tenderness. Normal range of motion. Normal pulse.     Left Achilles Tendon: Normal.     Right foot: Normal range of motion. No deformity, bunion, Charcot foot, foot drop or prominent metatarsal heads.     Left foot: Normal range of motion. No deformity, bunion, Charcot foot, foot drop or prominent metatarsal heads.     Comments: Hemosiderin deposits bilateral lower legs and pitting 1+/4 edema bilateral lower legs/sock line indent with no show ankle socks balegas cotton spandex  Feet:     Right foot:     Skin integrity: Erythema, callus, dry skin and fissure present. No ulcer or blister.     Toenail Condition: Right toenails are normal.     Left foot:     Skin integrity: Erythema, callus, dry skin and fissure present. No ulcer or blister.     Toenail Condition: Left toenails are normal.     Comments: Maceration between toes moist red and dry flaky skin; callous below MTP joints and heels Lymphadenopathy:     Cervical: No cervical adenopathy.     Right cervical: No superficial cervical adenopathy.    Left cervical: No superficial cervical adenopathy.  Skin:    General: Skin is warm and dry.     Capillary Refill: Capillary refill takes less than 2 seconds.     Coloration: Skin is not ashen, cyanotic, jaundiced, mottled, pale or sallow.     Findings: Abrasion, signs of injury and rash present. No abscess, acne, bruising, burn, ecchymosis, erythema, laceration, lesion, petechiae or wound. Rash is macular. Rash is not crusting, nodular, papular, purpuric, pustular, scaling, urticarial or vesicular.     Nails: There is no clubbing.          Comments: Macular tan hyperpigmented  bilateral lower legs mid shin to ankles; maceration/dry flaky skin between toes and callous bilateral feet MTPs joints and heels  Neurological:     General: No focal deficit present.     Mental  Status: He is alert. Mental status is at baseline. He is disoriented.     GCS: GCS eye subscore is 4. GCS motor subscore is 6.     Cranial Nerves: Cranial nerves are intact. No cranial nerve deficit, dysarthria or facial asymmetry.     Sensory: Sensation is intact. No sensory deficit.     Motor: Motor function is intact. No weakness, tremor, atrophy, abnormal muscle tone or seizure activity.     Coordination: Coordination is intact. Coordination normal.     Gait: Gait is intact. Gait normal.     Comments: Bilateral hand grasp equal 5/5; on/off exam table and in/out of chair without difficulty; gait sure and steady in hallway of clinic; patient answers questions but answers do not answer question asked  Psychiatric:        Attention and Perception: Attention normal.        Mood and Affect: Affect normal. Mood is anxious.        Speech: Speech is tangential.        Behavior: Behavior is cooperative.        Cognition and Memory: Cognition is impaired. Memory is impaired.        Judgment: Judgment is not impulsive.     Comments: Patient maintains good eye contact during exam/physical; speech a few words at a time not always understandable; anxious very fidgety; sometimes starts new line of conversation instead of answering question asked.      Spouse given copy of lab results and discussed in detail with comparison to results on file in paper chart with patient and spouse due to patient dementia.  EKG was going to get repeat today as new widening QRS otherwise normal but patient very fidgety today unable to sit still wanted to leave clinic as soon as arrived for appt.  Urine concentrated but otherwise normal today.  Encouraged increased water intake. Reviewed paper chart and iron level normal previous 3  years and one elevated level 166 in 2006. 107-166 MCV 98-99 MCH 33.8-34.9 lipids 2006-2019 TC 164-207 trigs 94-158 HDL 38-49 LDL 93-134   Delphina Schum, Jarold Songina A, NP  04/11/2019 6:11 PM EDT    Please print copy of lab results and pull paper chart and place on my shelf for review of previous results as elevated lipids/iron. Keep 04/13/2019 follow up appt for elevated iron, cholesterol (triglycerides/LDL), MCV and MCH I plan to give cholesterol and DASH or mediterranean diet and iron handouts at appt. Please verify with patient if drinking well or city water and if taking multivitamin or supplement with iron. I recommend exercise 150 minutes per week; dietary fiber 30 grams per day men; eat whole grains/fruits/vegetables; keep added sugars to less than 150 calories/9 teaspoons for men per American Heart Association; prostate, electrolytes, kidney/liver function, and thyroid normal No anemia or infection noted on complete blood count.  My chart message sent to patient  Jeannett SeniorStephen, I will be reviewing your paper chart on 04/13/2019 to compare these results to your previous on file and not in computer as I am working offsite today.  Keep 04/13/2019 follow up appt for elevated iron, cholesterol (triglycerides/LDL), MCV and MCH I plan to give cholesterol and DASH or mediterranean diet and iron handouts at appt. Are you drinking well or city water and if taking multivitamin or supplement with iron? I recommend exercise 150 minutes per week; dietary fiber 30 grams per day men; eat whole grains/fruits/vegetables; keep added sugars to less than 150 calories/9 teaspoons for men per American Heart  Association; prostate, electrolytes, kidney/liver function, and thyroid normal No anemia or infection noted on complete blood count.  Sincerely, Gerarda Fraction NP-C  Results for MYLEZ, VENABLE (MRN [id number]) as of 04/13/2019 13:51  Ref. Range 04/06/2019 08:24  Sodium Latest Ref Range: 134 - 144 mmol/L 138  Potassium  Latest Ref Range: 3.5 - 5.2 mmol/L 4.2  Chloride Latest Ref Range: 96 - 106 mmol/L 102  Glucose Latest Ref Range: 65 - 99 mg/dL 99  BUN Latest Ref Range: 8 - 27 mg/dL 14  Creatinine Latest Ref Range: 0.76 - 1.27 mg/dL 0.89  Calcium Latest Ref Range: 8.6 - 10.2 mg/dL 9.1  BUN/Creatinine Ratio Latest Ref Range: 10 - 24  16  Phosphorus Latest Ref Range: 2.8 - 4.1 mg/dL 3.5  Alkaline Phosphatase Latest Ref Range: 39 - 117 IU/L 69  Albumin Latest Ref Range: 3.8 - 4.9 g/dL 4.4  Albumin/Globulin Ratio Latest Ref Range: 1.2 - 2.2  1.4  Uric Acid Latest Ref Range: 3.8 - 8.4 mg/dL 7.2  AST Latest Ref Range: 0 - 40 IU/L 32  ALT Latest Ref Range: 0 - 44 IU/L 34  Total Protein Latest Ref Range: 6.0 - 8.5 g/dL 7.5  Total Bilirubin Latest Ref Range: 0.0 - 1.2 mg/dL 0.9  GGT Latest Ref Range: 0 - 65 IU/L 22  GFR, Est Non African American Latest Ref Range: >59 mL/min/1.73 93  GFR, Est African American Latest Ref Range: >59 mL/min/1.73 107  Estimated CHD Risk Latest Ref Range: 0.0 - 1.0 times avg. 1.0  LDH Latest Ref Range: 121 - 224 IU/L 185  Total CHOL/HDL Ratio Latest Ref Range: 0.0 - 5.0 ratio 4.7  Cholesterol, Total Latest Ref Range: 100 - 199 mg/dL 199  HDL Cholesterol Latest Ref Range: >39 mg/dL 42  Triglycerides Latest Ref Range: 0 - 149 mg/dL 182 (H)  VLDL Cholesterol Cal Latest Ref Range: 5 - 40 mg/dL 32  LDL Chol Calc (NIH) Latest Ref Range: 0 - 99 mg/dL 125 (H)  Iron Latest Ref Range: 38 - 169 ug/dL 219 (H)  Globulin, Total Latest Ref Range: 1.5 - 4.5 g/dL 3.1  WBC Latest Ref Range: 3.4 - 10.8 x10E3/uL 4.9  RBC Latest Ref Range: 4.14 - 5.80 x10E6/uL 4.57  Hemoglobin Latest Ref Range: 13.0 - 17.7 g/dL 16.2  HCT Latest Ref Range: 37.5 - 51.0 % 45.4  MCV Latest Ref Range: 79 - 97 fL 99 (H)  MCH Latest Ref Range: 26.6 - 33.0 pg 35.4 (H)  MCHC Latest Ref Range: 31.5 - 35.7 g/dL 35.7  RDW Latest Ref Range: 11.6 - 15.4 % 12.1  Platelets Latest Ref Range: 150 - 450 x10E3/uL 173  Neutrophils  Latest Ref Range: Not Estab. % 50  Immature Granulocytes Latest Ref Range: Not Estab. % 0  NEUT# Latest Ref Range: 1.4 - 7.0 x10E3/uL 2.4  Lymphocyte # Latest Ref Range: 0.7 - 3.1 x10E3/uL 1.9  Monocytes Absolute Latest Ref Range: 0.1 - 0.9 x10E3/uL 0.5  Basophils Absolute Latest Ref Range: 0.0 - 0.2 x10E3/uL 0.0  Immature Grans (Abs) Latest Ref Range: 0.0 - 0.1 x10E3/uL 0.0  Lymphs Latest Ref Range: Not Estab. % 39  Monocytes Latest Ref Range: Not Estab. % 10  Basos Latest Ref Range: Not Estab. % 0  Eos Latest Ref Range: Not Estab. % 1  EOS (ABSOLUTE) Latest Ref Range: 0.0 - 0.4 x10E3/uL 0.1  TSH Latest Ref Range: 0.450 - 4.500 uIU/mL 3.060  Thyroxine (T4) Latest Ref Range: 4.5 - 12.0 ug/dL 6.8  Free  Thyroxine Index Latest Ref Range: 1.2 - 4.9  1.7  T3 Uptake Ratio Latest Ref Range: 24 - 39 % 25  Prostate Specific Ag, Serum Latest Ref Range: 0.0 - 4.0 ng/mL 0.2  Results for NYMIR, RINGLER (MRN [id number]) as of 04/13/2019 13:51  Ref. Range 04/13/2019 10:27  Bilirubin, UA Unknown negative  Clarity, UA Unknown clear  Color, UA Unknown yellow  Glucose Latest Ref Range: Negative  Negative  Ketones, UA Unknown negative  Leukocytes,UA Latest Ref Range: Negative  Negative  Nitrite, UA Unknown negative  pH, UA Latest Ref Range: 5.0 - 8.0  5.5  Protein,UA Latest Ref Range: Negative  Negative  Specific Gravity, UA Latest Ref Range: 1.010 - 1.025  >=1.030 (A)  Urobilinogen, UA Latest Ref Range: 0.2 or 1.0 E.U./dL 0.2  RBC, UA Unknown negative      Assessment & Plan:  A-general physical without gynecological exam, tinea pedis bilateral, hypertension, obesity BMI 41, ventral hernia, frequent urination, hyperlipidemia, iron excess, cognitive impairment with memory loss, leg swelling  P-general physical in 1 year for annual labs.  Encouraged annual dental and eye exam.  Patient hasn't been using reading glasses/doing crossword puzzles since dementia worsening.  Spouse verbalized understanding  and had no further questions at this time.    Restart tinactin and alternating shoes (two pairs to allow them to dry out thoroughly between wearing)  Medication as directed restart tinactin spray bilateral feet daily. Call or return to clinic as needed if these symptoms worsen or fail to improve as anticipated. Exitcare handout on tinea pedis printed and given to spouse. Discussed hygiene with patient e.g. do not stay in sweat soaked clothes/socks.  May need to change socks midday during summer.  Reoccurrence of condition common and may require retreatment. Sometimes using blowdryer to dry skin thoroughly also helpful.    Patient verbalized agreement and understanding of treatment plan and had no further questions at this time. P2: Avoidance and hand washing.  Continue current medications as directed. Lisinopril 20mg  po daily.  I recommended dASH diet and exercise program 150 minutes exercise/activity per week.  Recommended weight loss/weight maintenance to BMI 20-25. Return to the clinic if any new symptoms/notify clinic staff if visual changes, frequent headache, chest pain or dyspnea on mild or  minimal exertion. Exitcare handout on managing hypertension and leg swelling.  Consider compression socks knee high and elevating feet when sitting.  Spouse reported patient prefers not to elevate feet or wear tall stockings.  Will consider increasing lisinopril if blood pressure remains elevated at follow up visits.  Patient verbalized agreement and understanding of treatment plan and had no further questions at this time. P2: Diet and Exercise specific for HTN  Keep follow up with neurology as spouse reports worsening of his confusion and mood swings.  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    Scheduled already. Keep patient active as tends to improve restlessness and improve sleep also.   Spouse verbalized understanding information/instructions and had no further questions at this time.  Repeat iron level  in 3 months as new elevation and consider stopping spring water.  Aquafina does extra filtration/reverse osmosis not likely to be source of elevated iron.  Check vitamin at home again to ensure no iron in his formula.  Patient eating varied diet.  Discussed elevated iron levels can build up in the body and cause other health problems.  Exitcare handout on iron test.  Spouse verbalized understanding information/instructions, agreed with plan of care and had no further questions at  this time.  Increase fiber fruits and vegetables. Spouse given printed handouts hyperlipidemia, dash, high fiber diet  Consider statin if no improvement with dietary and activity changes.  Spouse trying to get patient enrolled in dementia daycare after covid vaccinations completed.  Discussed lifestyle modification re diet to decrease white starches/sugars e.g. potatoes/bread increase omega 3 sources nuts, fish options add more fruits and vegetables and lean protein choices such as hummus or yogurt. Patient given exitcare handout on high cholesterol. Discussed weight loss and 150 minutes activity per week. Spouse  agreed with plan of care and verbalized understanding of information/instructions and had no further questions at this time.   Discussed soda and some foods can increase bladder irritation e.g. asparagus, cauliflower, broccolli.  Urology workup negative for incomplete emptying of bladder 2019.  Does have BPH with LUTS.  Ditropan stopped due to dementia.  Increase water intake; decrease soda intake.  Spouse verbalized understanding information/instructions, agreed with plan of care and had no further questions at this time.    Monitor abdomen for bulges or if patient rubbing/changes in stooling resolves at rest bulge with sit up.  Spouse to monitor if enlarging to notify clinic staff and will consider imaging/surgical referral.  If painful with or without vomiting/bloody stools seek re-evaluation same day discussed these are  symptoms of incarcerated bowel and may need same day surgical intervention to prevent bowel damage or tissue death.  Discussed avoid heavy lifting/breath holding with lifting.  Encouraged weight loss and avoid weight gain as this puts more pressure on abdominal muscles.  Exitcare handout on ventral hernia printed and given to spouse.  Spouse verbalized understanding information/instructions, agreed with plan of care and had no further questions at this time.  Patient unable to sit still today moving constantly will not repeat EKG today.  BMI 41 weight gain due to social distancing with covid 19 pandemic.  Decrease non-nutritional/added sugar intake and increase activity level for weight loss.

## 2019-04-13 NOTE — Progress Notes (Signed)
Patient with Hx of Dementia - arrived to clinic today accompanied by spouse, [name] Vialpando. He was pacing in the waiting room. Unable to repeat EKG at this visit.  AMD

## 2019-04-13 NOTE — Patient Instructions (Addendum)
DASH Eating Plan DASH stands for "Dietary Approaches to Stop Hypertension." The DASH eating plan is a healthy eating plan that has been shown to reduce high blood pressure (hypertension). It may also reduce your risk for type 2 diabetes, heart disease, and stroke. The DASH eating plan may also help with weight loss. What are tips for following this plan?  General guidelines  Avoid eating more than 2,300 mg (milligrams) of salt (sodium) a day. If you have hypertension, you may need to reduce your sodium intake to 1,500 mg a day.  Limit alcohol intake to no more than 1 drink a day for nonpregnant women and 2 drinks a day for men. One drink equals 12 oz of beer, 5 oz of wine, or 1 oz of hard liquor.  Work with your health care provider to maintain a healthy body weight or to lose weight. Ask what an ideal weight is for you.  Get at least 30 minutes of exercise that causes your heart to beat faster (aerobic exercise) most days of the week. Activities may include walking, swimming, or biking.  Work with your health care provider or diet and nutrition specialist (dietitian) to adjust your eating plan to your individual calorie needs. Reading food labels   Check food labels for the amount of sodium per serving. Choose foods with less than 5 percent of the Daily Value of sodium. Generally, foods with less than 300 mg of sodium per serving fit into this eating plan.  To find whole grains, look for the word "whole" as the first word in the ingredient list. Shopping  Buy products labeled as "low-sodium" or "no salt added."  Buy fresh foods. Avoid canned foods and premade or frozen meals. Cooking  Avoid adding salt when cooking. Use salt-free seasonings or herbs instead of table salt or sea salt. Check with your health care provider or pharmacist before using salt substitutes.  Do not fry foods. Cook foods using healthy methods such as baking, boiling, grilling, and broiling instead.  Cook with  heart-healthy oils, such as olive, canola, soybean, or sunflower oil. Meal planning  Eat a balanced diet that includes: ? 5 or more servings of fruits and vegetables each day. At each meal, try to fill half of your plate with fruits and vegetables. ? Up to 6-8 servings of whole grains each day. ? Less than 6 oz of lean meat, poultry, or fish each day. A 3-oz serving of meat is about the same size as a deck of cards. One egg equals 1 oz. ? 2 servings of low-fat dairy each day. ? A serving of nuts, seeds, or beans 5 times each week. ? Heart-healthy fats. Healthy fats called Omega-3 fatty acids are found in foods such as flaxseeds and coldwater fish, like sardines, salmon, and mackerel.  Limit how much you eat of the following: ? Canned or prepackaged foods. ? Food that is high in trans fat, such as fried foods. ? Food that is high in saturated fat, such as fatty meat. ? Sweets, desserts, sugary drinks, and other foods with added sugar. ? Full-fat dairy products.  Do not salt foods before eating.  Try to eat at least 2 vegetarian meals each week.  Eat more home-cooked food and less restaurant, buffet, and fast food.  When eating at a restaurant, ask that your food be prepared with less salt or no salt, if possible. What foods are recommended? The items listed may not be a complete list. Talk with your dietitian about   what dietary choices are best for you. Grains Whole-grain or whole-wheat bread. Whole-grain or whole-wheat pasta. Brown rice. Oatmeal. Quinoa. Bulgur. Whole-grain and low-sodium cereals. Pita bread. Low-fat, low-sodium crackers. Whole-wheat flour tortillas. Vegetables Fresh or frozen vegetables (raw, steamed, roasted, or grilled). Low-sodium or reduced-sodium tomato and vegetable juice. Low-sodium or reduced-sodium tomato sauce and tomato paste. Low-sodium or reduced-sodium canned vegetables. Fruits All fresh, dried, or frozen fruit. Canned fruit in natural juice (without  added sugar). Meat and other protein foods Skinless chicken or turkey. Ground chicken or turkey. Pork with fat trimmed off. Fish and seafood. Egg whites. Dried beans, peas, or lentils. Unsalted nuts, nut butters, and seeds. Unsalted canned beans. Lean cuts of beef with fat trimmed off. Low-sodium, lean deli meat. Dairy Low-fat (1%) or fat-free (skim) milk. Fat-free, low-fat, or reduced-fat cheeses. Nonfat, low-sodium ricotta or cottage cheese. Low-fat or nonfat yogurt. Low-fat, low-sodium cheese. Fats and oils Soft margarine without trans fats. Vegetable oil. Low-fat, reduced-fat, or light mayonnaise and salad dressings (reduced-sodium). Canola, safflower, olive, soybean, and sunflower oils. Avocado. Seasoning and other foods Herbs. Spices. Seasoning mixes without salt. Unsalted popcorn and pretzels. Fat-free sweets. What foods are not recommended? The items listed may not be a complete list. Talk with your dietitian about what dietary choices are best for you. Grains Baked goods made with fat, such as croissants, muffins, or some breads. Dry pasta or rice meal packs. Vegetables Creamed or fried vegetables. Vegetables in a cheese sauce. Regular canned vegetables (not low-sodium or reduced-sodium). Regular canned tomato sauce and paste (not low-sodium or reduced-sodium). Regular tomato and vegetable juice (not low-sodium or reduced-sodium). Pickles. Olives. Fruits Canned fruit in a light or heavy syrup. Fried fruit. Fruit in cream or butter sauce. Meat and other protein foods Fatty cuts of meat. Ribs. Fried meat. Bacon. Sausage. Bologna and other processed lunch meats. Salami. Fatback. Hotdogs. Bratwurst. Salted nuts and seeds. Canned beans with added salt. Canned or smoked fish. Whole eggs or egg yolks. Chicken or turkey with skin. Dairy Whole or 2% milk, cream, and half-and-half. Whole or full-fat cream cheese. Whole-fat or sweetened yogurt. Full-fat cheese. Nondairy creamers. Whipped toppings.  Processed cheese and cheese spreads. Fats and oils Butter. Stick margarine. Lard. Shortening. Ghee. Bacon fat. Tropical oils, such as coconut, palm kernel, or palm oil. Seasoning and other foods Salted popcorn and pretzels. Onion salt, garlic salt, seasoned salt, table salt, and sea salt. Worcestershire sauce. Tartar sauce. Barbecue sauce. Teriyaki sauce. Soy sauce, including reduced-sodium. Steak sauce. Canned and packaged gravies. Fish sauce. Oyster sauce. Cocktail sauce. Horseradish that you find on the shelf. Ketchup. Mustard. Meat flavorings and tenderizers. Bouillon cubes. Hot sauce and Tabasco sauce. Premade or packaged marinades. Premade or packaged taco seasonings. Relishes. Regular salad dressings. Where to find more information:  National Heart, Lung, and Blood Institute: www.nhlbi.nih.gov  American Heart Association: www.heart.org Summary  The DASH eating plan is a healthy eating plan that has been shown to reduce high blood pressure (hypertension). It may also reduce your risk for type 2 diabetes, heart disease, and stroke.  With the DASH eating plan, you should limit salt (sodium) intake to 2,300 mg a day. If you have hypertension, you may need to reduce your sodium intake to 1,500 mg a day.  When on the DASH eating plan, aim to eat more fresh fruits and vegetables, whole grains, lean proteins, low-fat dairy, and heart-healthy fats.  Work with your health care provider or diet and nutrition specialist (dietitian) to adjust your eating plan to your   individual calorie needs. This information is not intended to replace advice given to you by your health care provider. Make sure you discuss any questions you have with your health care provider. Document Revised: 12/26/2016 Document Reviewed: 01/07/2016 Elsevier Patient Education  2020 [employer] High Cholesterol  High cholesterol is a condition in which the blood has high levels of a white, waxy, fat-like substance  (cholesterol). The human body needs small amounts of cholesterol. The liver makes all the cholesterol that the body needs. Extra (excess) cholesterol comes from the food that we eat. Cholesterol is carried from the liver by the blood through the blood vessels. If you have high cholesterol, deposits (plaques) may build up on the walls of your blood vessels (arteries). Plaques make the arteries narrower and stiffer. Cholesterol plaques increase your risk for heart attack and stroke. Work with your health care provider to keep your cholesterol levels in a healthy range. What increases the risk? This condition is more likely to develop in people who:  Eat foods that are high in animal fat (saturated fat) or cholesterol.  Are overweight.  Are not getting enough exercise.  Have a family history of high cholesterol. What are the signs or symptoms? There are no symptoms of this condition. How is this diagnosed? This condition may be diagnosed from the results of a blood test.  If you are older than age 20, your health care provider may check your cholesterol every 4-6 years.  You may be checked more often if you already have high cholesterol or other risk factors for heart disease. The blood test for cholesterol measures:  "Bad" cholesterol (LDL cholesterol). This is the main type of cholesterol that causes heart disease. The desired level for LDL is less than 100.  "Good" cholesterol (HDL cholesterol). This type helps to protect against heart disease by cleaning the arteries and carrying the LDL away. The desired level for HDL is 60 or higher.  Triglycerides. These are fats that the body can store or burn for energy. The desired number for triglycerides is lower than 150.  Total cholesterol. This is a measure of the total amount of cholesterol in your blood, including LDL cholesterol, HDL cholesterol, and triglycerides. A healthy number is less than 200. How is this treated? This condition is  treated with diet changes, lifestyle changes, and medicines. Diet changes  This may include eating more whole grains, fruits, vegetables, nuts, and fish.  This may also include cutting back on red meat and foods that have a lot of added sugar. Lifestyle changes  Changes may include getting at least 40 minutes of aerobic exercise 3 times a week. Aerobic exercises include walking, biking, and swimming. Aerobic exercise along with a healthy diet can help you maintain a healthy weight.  Changes may also include quitting smoking. Medicines  Medicines are usually given if diet and lifestyle changes have failed to reduce your cholesterol to healthy levels.  Your health care provider may prescribe a statin medicine. Statin medicines have been shown to reduce cholesterol, which can reduce the risk of heart disease. Follow these instructions at home: Eating and drinking If told by your health care provider:  Eat chicken (without skin), fish, veal, shellfish, ground turkey breast, and round or loin cuts of red meat.  Do not eat fried foods or fatty meats, such as hot dogs and salami.  Eat plenty of fruits, such as apples.  Eat plenty of vegetables, such as broccoli, potatoes, and carrots.  Eat beans, peas,   and lentils.  Eat grains such as barley, rice, couscous, and bulgur wheat.  Eat pasta without cream sauces.  Use skim or nonfat milk, and eat low-fat or nonfat yogurt and cheeses.  Do not eat or drink whole milk, cream, ice cream, egg yolks, or hard cheeses.  Do not eat stick margarine or tub margarines that contain trans fats (also called partially hydrogenated oils).  Do not eat saturated tropical oils, such as coconut oil and palm oil.  Do not eat cakes, cookies, crackers, or other baked goods that contain trans fats.  General instructions  Exercise as directed by your health care provider. Increase your activity level with activities such as gardening, walking, and taking the  stairs.  Take over-the-counter and prescription medicines only as told by your health care provider.  Do not use any products that contain nicotine or tobacco, such as cigarettes and e-cigarettes. If you need help quitting, ask your health care provider.  Keep all follow-up visits as told by your health care provider. This is important. Contact a health care provider if:  You are struggling to maintain a healthy diet or weight.  You need help to start on an exercise program.  You need help to stop smoking. Get help right away if:  You have chest pain.  You have trouble breathing. This information is not intended to replace advice given to you by your health care provider. Make sure you discuss any questions you have with your health care provider. Document Revised: 01/16/2017 Document Reviewed: 07/14/2015 Elsevier Patient Education  2020 [employer] Inc. High-Fiber Diet Fiber, also called dietary fiber, is a type of carbohydrate that is found in fruits, vegetables, whole grains, and beans. A high-fiber diet can have many health benefits. Your health care provider may recommend a high-fiber diet to help:  Prevent constipation. Fiber can make your bowel movements more regular.  Lower your cholesterol.  Relieve the following conditions: ? Swelling of veins in the anus (hemorrhoids). ? Swelling and irritation (inflammation) of specific areas of the digestive tract (uncomplicated diverticulosis). ? A problem of the large intestine (colon) that sometimes causes pain and diarrhea (irritable bowel syndrome, IBS).  Prevent overeating as part of a weight-loss plan.  Prevent heart disease, type 2 diabetes, and certain cancers. What is my plan? The recommended daily fiber intake in grams (g) includes:  38 g for men age 87 or younger.  30 g for men over age 39.  25 g for women age 52 or younger.  21 g for women over age 52. You can get the recommended daily intake of dietary fiber  by:  Eating a variety of fruits, vegetables, grains, and beans.  Taking a fiber supplement, if it is not possible to get enough fiber through your diet. What do I need to know about a high-fiber diet?  It is better to get fiber through food sources rather than from fiber supplements. There is not a lot of research about how effective supplements are.  Always check the fiber content on the nutrition facts label of any prepackaged food. Look for foods that contain 5 g of fiber or more per serving.  Talk with a diet and nutrition specialist (dietitian) if you have questions about specific foods that are recommended or not recommended for your medical condition, especially if those foods are not listed below.  Gradually increase how much fiber you consume. If you increase your intake of dietary fiber too quickly, you may have bloating, cramping, or gas.  Drink plenty of water. Water helps you to digest fiber. What are tips for following this plan?  Eat a wide variety of high-fiber foods.  Make sure that half of the grains that you eat each day are whole grains.  Eat breads and cereals that are made with whole-grain flour instead of refined flour or white flour.  Eat brown rice, bulgur wheat, or millet instead of white rice.  Start the day with a breakfast that is high in fiber, such as a cereal that contains 5 g of fiber or more per serving.  Use beans in place of meat in soups, salads, and pasta dishes.  Eat high-fiber snacks, such as berries, raw vegetables, nuts, and popcorn.  Choose whole fruits and vegetables instead of processed forms like juice or sauce. What foods can I eat?  Fruits Berries. Pears. Apples. Oranges. Avocado. Prunes and raisins. Dried figs. Vegetables Sweet potatoes. Spinach. Kale. Artichokes. Cabbage. Broccoli. Cauliflower. Green peas. Carrots. Squash. Grains Whole-grain breads. Multigrain cereal. Oats and oatmeal. Brown rice. Barley. Bulgur wheat. Millet.  Quinoa. Bran muffins. Popcorn. Rye wafer crackers. Meats and other proteins Navy, kidney, and pinto beans. Soybeans. Split peas. Lentils. Nuts and seeds. Dairy Fiber-fortified yogurt. Beverages Fiber-fortified soy milk. Fiber-fortified orange juice. Other foods Fiber bars. The items listed above may not be a complete list of recommended foods and beverages. Contact a dietitian for more options. What foods are not recommended? Fruits Fruit juice. Cooked, strained fruit. Vegetables Fried potatoes. Canned vegetables. Well-cooked vegetables. Grains White bread. Pasta made with refined flour. White rice. Meats and other proteins Fatty cuts of meat. Fried chicken or fried fish. Dairy Milk. Yogurt. Cream cheese. Sour cream. Fats and oils Butters. Beverages Soft drinks. Other foods Cakes and pastries. The items listed above may not be a complete list of foods and beverages to avoid. Contact a dietitian for more information. Summary  Fiber is a type of carbohydrate. It is found in fruits, vegetables, whole grains, and beans.  There are many health benefits of eating a high-fiber diet, such as preventing constipation, lowering blood cholesterol, helping with weight loss, and reducing your risk of heart disease, diabetes, and certain cancers.  Gradually increase your intake of fiber. Increasing too fast can result in cramping, bloating, and gas. Drink plenty of water while you increase your fiber.  The best sources of fiber include whole fruits and vegetables, whole grains, nuts, seeds, and beans. This information is not intended to replace advice given to you by your health care provider. Make sure you discuss any questions you have with your health care provider. Document Revised: 11/17/2016 Document Reviewed: 11/17/2016 Elsevier Patient Education  2020 Else[employer] Inc. Edema  Edema is an abnormal buildup of fluids in the body tissues and under the skin. Swelling of the legs, feet, and  ankles is a common symptom that becomes more likely as you get older. Swelling is also common in looser tissues, like around the eyes. When the affected area is squeezed, the fluid may move out of that spot and leave a dent for a few moments. This dent is called pitting edema. There are many possible causes of edema. Eating too much salt (sodium) and being on your feet or sitting for a long time can cause edema in your legs, feet, and ankles. Hot weather may make edema worse. Common causes of edema include:  Heart failure.  Liver or kidney disease.  Weak leg blood vessels.  Cancer.  An injury.  Pregnancy.  Medicines.  Being  obese.  Low protein levels in the blood. Edema is usually painless. Your skin may look swollen or shiny. Follow these instructions at home:  Keep the affected body part raised (elevated) above the level of your heart when you are sitting or lying down.  Do not sit still or stand for long periods of time.  Do not wear tight clothing. Do not wear garters on your upper legs.  Exercise your legs to get your circulation going. This helps to move the fluid back into your blood vessels, and it may help the swelling go down.  Wear elastic bandages or support stockings to reduce swelling as told by your health care provider.  Eat a low-salt (low-sodium) diet to reduce fluid as told by your health care provider.  Depending on the cause of your swelling, you may need to limit how much fluid you drink (fluid restriction).  Take over-the-counter and prescription medicines only as told by your health care provider. Contact a health care provider if:  Your edema does not get better with treatment.  You have heart, liver, or kidney disease and have symptoms of edema.  You have sudden and unexplained weight gain. Get help right away if:  You develop shortness of breath or chest pain.  You cannot breathe when you lie down.  You develop pain, redness, or warmth in  the swollen areas.  You have heart, liver, or kidney disease and suddenly get edema.  You have a fever and your symptoms suddenly get worse. Summary  Edema is an abnormal buildup of fluids in the body tissues and under the skin.  Eating too much salt (sodium) and being on your feet or sitting for a long time can cause edema in your legs, feet, and ankles.  Keep the affected body part raised (elevated) above the level of your heart when you are sitting or lying down. This information is not intended to replace advice given to you by your health care provider. Make sure you discuss any questions you have with your health care provider. Document Revised: 06/02/2018 Document Reviewed: 02/16/2016 Elsevier Patient Education  [place]  Athlete's foot (tinea pedis) is a fungal infection of the skin on your feet. It often occurs on the skin that is between or underneath the toes. It can also occur on the soles of your feet. The infection can spread from person to person (is contagious). It can also spread when a person's bare feet come in contact with the fungus on shower floors or on items such as shoes. What are the causes? This condition is caused by a fungus that grows in warm, moist places. You can get athlete's foot by sharing shoes, shower stalls, towels, and wet floors with someone who is infected. Not washing your feet or changing your socks often enough can also lead to athlete's foot. What increases the risk? This condition is more likely to develop in:  Men.  People who have a weak body defense system (immune system).  People who have diabetes.  People who use public showers, such as at a gym.  People who wear heavy-duty shoes, such as Environmental manager.  Seasons with warm, humid weather. What are the signs or symptoms? Symptoms of this condition include:  Itchy areas between your toes or on the soles of your feet.  White, flaky, or scaly  areas between your toes or on the soles of your feet.  Very itchy small blisters between your toes or  on the soles of your feet.  Small cuts in your skin. These cuts can become infected.  Thick or discolored toenails. How is this diagnosed? This condition may be diagnosed with a physical exam and a review of your medical history. Your health care provider may also take a skin or toenail sample to examine under a microscope. How is this treated? This condition is treated with antifungal medicines. These may be applied as powders, ointments, or creams. In severe cases, an oral antifungal medicine may be given. Follow these instructions at home: Medicines  Apply or take over-the-counter and prescription medicines only as told by your health care provider.  Apply your antifungal medicine as told by your health care provider. Do not stop using the antifungal even if your condition improves. Foot care  Do not scratch your feet.  Keep your feet dry: ? Wear cotton or wool socks. Change your socks every day or if they become wet. ? Wear shoes that allow air to flow, such as sandals or canvas tennis shoes.  Wash and dry your feet, including the area between your toes. Also, wash and dry your feet: ? Every day or as told by your health care provider. ? After exercising. General instructions  Do not let others use towels, shoes, nail clippers, or other personal items that touch your feet.  Protect your feet by wearing sandals in wet areas, such as locker rooms and shared showers.  Keep all follow-up visits as told by your health care provider. This is important.  If you have diabetes, keep your blood sugar under control. Contact a health care provider if:  You have a fever.  You have swelling, soreness, warmth, or redness in your foot.  Your feet are not getting better with treatment.  Your symptoms get worse.  You have new symptoms. Summary  Athlete's foot (tinea pedis) is a  fungal infection of the skin on your feet. It often occurs on skin that is between or underneath the toes.  This condition is caused by a fungus that grows in warm, moist places.  Symptoms include white, flaky, or scaly areas between your toes or on the soles of your feet.  This condition is treated with antifungal medicines.  Keep your feet clean. Always dry them thoroughly. This information is not intended to replace advice given to you by your health care provider. Make sure you discuss any questions you have with your health care provider. Document Revised: 01/08/2017 Document Reviewed: 11/03/2016 Elsevier Patient Education  2020 [employer].  Iron Level and Total Iron-Binding Capacity Tests Why am I having this test? Iron level and total iron-binding capacity (TIBC) tests may be used to:  Diagnose iron-deficiency anemia, a condition in which the concentration of red blood cells or hemoglobin in the blood is below normal because of too little iron.  Monitor treatment for iron-deficiency anemia.  Monitor conditions that cause your body to break down red blood cells more quickly than normal. You may have these tests if:  You are pregnant.  You have had a blood test that showed abnormal red blood cell numbers, size, or color.  Your health care provider suspects iron overload, iron poisoning, or low levels of iron due to blood loss. What is being tested? These tests measure the amount of iron in your blood (serum iron level) and the amount of a type of protein called transferrin. Iron comes from your diet and becomes part of the hemoglobin in your red blood cells. For  iron to bind to hemoglobin, it must first be carried from your small intestine into your bone marrow by transferrin. The tests determine the amount of transferrin that is available to transport iron to the bone marrow (iron-binding capacity). What kind of sample is taken?  A blood sample is required for this test. It  is usually collected by inserting a needle into a blood vessel. How do I prepare for this test? Do not eat or drink anything except water after midnight on the night before the test or as told by your health care provider. Tell a health care provider about:  All medicines you are taking, including vitamins, herbs, eye drops, creams, and over-the-counter medicines.  Any recent blood transfusions you have had.  Any medical conditions you have.  Whether you have had any recent meals that contained high levels of iron. How are the results reported? Your test results will be reported as values. Your health care provider will compare your results to normal ranges that were established after testing a large group of people (reference ranges). Reference ranges may vary among labs and hospitals. For this test, common reference ranges are: Iron  Male: 80-180 mcg/dL or 16-1014-32 micromoles/L (SI units).  Male: 60-160 mcg/dL or 96-0411-29 micromoles/L (SI units).  Newborn: 100-250 mcg/dL.  Child: 50-120 mcg/dL. TIBC  250-460 mcg/dL or 54-0945-82 micromoles/L (SI units). Transferrin  Adult male: 215-365 mg/dL or 8.11-9.142.15-3.65 g/L (SI units).  Adult male: 250-380 mg/dL or 7.8-2.92.5-3.8 g/L (SI units).  Newborn: 130-275 mg/dL.  Child: 203-360 mg/dL. Transferrin saturation  Male: 20-50%.  Male: 15-50%. What do the results mean? Serum iron levels that are higher than normal may be related to many health conditions, including:  Genetic disorders that cause an increase in iron levels, such as hemosiderosis and hemochromatosis.  Iron poisoning.  Hemolytic anemia.  Liver diseases such as hepatitis and hepatic necrosis.  Lead poisoning. Serum iron levels that are lower than normal may be related to:  Poor dietary iron intake.  Long-term blood loss.  Insufficient absorption of iron in the small intestine.  Pregnancy.  Iron-deficiency anemia.  Tumors. Abnormally high TIBC or transferrin levels  may be related to:  Estrogen therapy.  Pregnancy.  Polycythemia vera.  Iron-deficiency anemia. Abnormally low TIBC or transferrin levels may indicate various conditions, including:  Malnutrition.  Hypoproteinemia.  Conditions that cause swelling (inflammation) throughout the body.  Cirrhosis of the liver.  Some anemias, including hemolytic, pernicious, and sickle cell anemias. Talk with your health care provider about what your results mean. Questions to ask your health care provider Ask your health care provider, or the department that is doing the test:  When will my results be ready?  How will I get my results?  What are my treatment options?  What other tests do I need?  What are my next steps? Summary  Iron level and total iron-binding capacity (TIBC) tests may be used to diagnose iron-deficiency anemia, monitor treatment for iron-deficiency anemia, or monitor conditions that cause your body to break down red blood cells more quickly than normal.  These tests measure the amount of iron in your blood (serum iron level) and the amount of a type of protein called transferrin. Transferrin transports iron in your body so that the iron can become part of the hemoglobin in your red blood cells.  For this test, a blood sample is usually collected by inserting a needle into a blood vessel.  Talk with your health care provider about what your results  mean. This information is not intended to replace advice given to you by your health care provider. Make sure you discuss any questions you have with your health care provider. Document Revised: 09/02/2017 Document Reviewed: 09/04/2016 Elsevier Patient Education  2020 [employer].

## 2019-04-18 DIAGNOSIS — R69 Illness, unspecified: Secondary | ICD-10-CM | POA: Diagnosis not present

## 2019-04-18 DIAGNOSIS — R413 Other amnesia: Secondary | ICD-10-CM | POA: Diagnosis not present

## 2019-04-18 DIAGNOSIS — Z79899 Other long term (current) drug therapy: Secondary | ICD-10-CM | POA: Diagnosis not present

## 2019-04-27 ENCOUNTER — Other Ambulatory Visit: Payer: Self-pay

## 2019-04-27 DIAGNOSIS — I10 Essential (primary) hypertension: Secondary | ICD-10-CM

## 2019-04-27 MED ORDER — LISINOPRIL 20 MG PO TABS: 20.0000 mg | ORAL_TABLET | Freq: Every day | ORAL | 2 refills | Status: DC

## 2019-04-27 NOTE — Telephone Encounter (Signed)
Patient recently seen by me for annual physical/labs completed.  BP 151/91 and anxious wanting to leave as soon as he arrived to clinic and confusion worse recently per spouse.  Neurology appt was pending.  Will give 90 day supply as wanted to have repeat BP from office visit to see if dose change needed.  Spouse also trying to get patient in memory daycare program so he would get more activity also--they had been closed the past year due to covid pandemic.  Electronic Rx to patient pharmacy of choice lisinopril 20mg  po daily #30 RF2

## 2019-06-10 DIAGNOSIS — E569 Vitamin deficiency, unspecified: Secondary | ICD-10-CM | POA: Diagnosis not present

## 2019-06-10 DIAGNOSIS — R69 Illness, unspecified: Secondary | ICD-10-CM | POA: Diagnosis not present

## 2019-06-10 DIAGNOSIS — F0280 Dementia in other diseases classified elsewhere without behavioral disturbance: Secondary | ICD-10-CM | POA: Diagnosis not present

## 2019-07-26 ENCOUNTER — Other Ambulatory Visit: Payer: Self-pay | Admitting: Registered Nurse

## 2019-07-26 DIAGNOSIS — I10 Essential (primary) hypertension: Secondary | ICD-10-CM

## 2019-07-26 NOTE — Telephone Encounter (Signed)
[facility]B pt. Thanks!

## 2019-09-22 ENCOUNTER — Ambulatory Visit
Admission: RE | Admit: 2019-09-22 | Discharge: 2019-09-22 | Disposition: A | Discharge status: Home / Self Care | Payer: 59 | Attending: *Deleted | Admitting: *Deleted

## 2019-09-22 ENCOUNTER — Ambulatory Visit
Admission: RE | Admit: 2019-09-22 | Discharge: 2019-09-22 | Disposition: A | Discharge status: Home / Self Care | Payer: 59 | Source: Ambulatory Visit | Attending: Emergency Medicine | Admitting: Emergency Medicine

## 2019-09-22 ENCOUNTER — Ambulatory Visit: Payer: Self-pay | Admitting: Emergency Medicine

## 2019-09-22 ENCOUNTER — Encounter: Payer: Self-pay | Admitting: Emergency Medicine

## 2019-09-22 ENCOUNTER — Other Ambulatory Visit: Payer: Self-pay

## 2019-09-22 VITALS — BP 133/71 | HR 65 | Temp 99.4°F | Resp 16 | Ht 72.0 in | Wt 318.0 lb

## 2019-09-22 DIAGNOSIS — R234 Changes in skin texture: Secondary | ICD-10-CM

## 2019-09-22 DIAGNOSIS — M25571 Pain in right ankle and joints of right foot: Secondary | ICD-10-CM

## 2019-09-22 MED ORDER — UREA 20 % EX CREA: TOPICAL_CREAM | CUTANEOUS | 1 refills | Status: DC

## 2019-09-22 NOTE — Progress Notes (Signed)
  Occupational Health Provider Note       Time seen: 8:19 AM    I have reviewed the vital signs and the nursing notes.  HISTORY   Chief Complaint No chief complaint on file.   HPI Nathan Schroeder is a 59 y.o. male with a history of Alzheimer's disease, anxiety, hypertension who presents today for intermittent right foot pain.  [name] wife is concerned because of some right lateral foot pain.  Has some soreness to the lateral aspect of the right foot distally.  Past Medical History:  Diagnosis Date  . Alzheimer's disease (HCC)   . Anxiety   . Hypertension     Past Surgical History:  Procedure Laterality Date  . AMPUTATION Right 1980  . PROSTATE SURGERY     [facility]    Allergies Patient has no known allergies.   Review of Systems Constitutional: Negative for fever. Musculoskeletal: Positive for right foot pain Skin: Positive for skin irritation Neurological: Positive for worsening dementia  All systems negative/normal/unremarkable except as stated in the HPI  ____________________________________________   PHYSICAL EXAM:  VITAL SIGNS: Vitals:   09/22/19 0820  BP: 133/71  Pulse: 65  Resp: 16  Temp: 99.4 F (37.4 C)  SpO2: 97%    Constitutional: Alert but disoriented, well appearing and in no distress. Eyes: Conjunctivae are normal. Normal extraocular movements. Musculoskeletal: Tenderness over the lateral aspect of the right foot distally.  Previous right fifth digit amputation.  Some skin erythema and inflammation over the same location. Neurologic:  Normal speech and language. No gross focal neurologic deficits are appreciated.  Skin: Some skin irritation, dry and cracked skin Psychiatric: Confusion  ____________________________________________   LABS (pertinent positives/negatives)  No results found for this or any previous visit (from the past 2160 hour(s)).   ASSESSMENT AND PLAN  Right foot pain   Plan: The patient  had presented for right foot pain.  We will obtain outpatient imaging of the right foot.  He does have some irritation over the lateral aspect, will provide cushioning over this site to prevent ulceration.  I will renew a foot cream for him as well.  Daryel November MD    Note: This note was generated in part or whole with voice recognition software. Voice recognition is usually quite accurate but there are transcription errors that can and very often do occur. I apologize for any typographical errors that were not detected and corrected.

## 2020-01-25 DIAGNOSIS — G309 Alzheimer's disease, unspecified: Secondary | ICD-10-CM | POA: Diagnosis not present

## 2020-01-25 DIAGNOSIS — R918 Other nonspecific abnormal finding of lung field: Secondary | ICD-10-CM | POA: Diagnosis not present

## 2020-01-25 DIAGNOSIS — R69 Illness, unspecified: Secondary | ICD-10-CM | POA: Diagnosis not present

## 2020-01-25 DIAGNOSIS — Z20822 Contact with and (suspected) exposure to covid-19: Secondary | ICD-10-CM | POA: Diagnosis not present

## 2020-01-26 DIAGNOSIS — R69 Illness, unspecified: Secondary | ICD-10-CM | POA: Diagnosis not present

## 2020-01-27 DIAGNOSIS — R69 Illness, unspecified: Secondary | ICD-10-CM | POA: Diagnosis not present

## 2020-01-27 DIAGNOSIS — R456 Violent behavior: Secondary | ICD-10-CM | POA: Diagnosis not present

## 2020-01-27 DIAGNOSIS — F0281 Dementia in other diseases classified elsewhere with behavioral disturbance: Secondary | ICD-10-CM | POA: Diagnosis not present

## 2020-01-27 DIAGNOSIS — G300 Alzheimer's disease with early onset: Secondary | ICD-10-CM | POA: Diagnosis not present

## 2020-01-28 DIAGNOSIS — R69 Illness, unspecified: Secondary | ICD-10-CM | POA: Diagnosis not present

## 2020-01-28 DIAGNOSIS — R456 Violent behavior: Secondary | ICD-10-CM | POA: Diagnosis not present

## 2020-01-29 DIAGNOSIS — F0281 Dementia in other diseases classified elsewhere with behavioral disturbance: Secondary | ICD-10-CM | POA: Diagnosis not present

## 2020-01-29 DIAGNOSIS — G300 Alzheimer's disease with early onset: Secondary | ICD-10-CM | POA: Diagnosis not present

## 2020-01-30 DIAGNOSIS — R69 Illness, unspecified: Secondary | ICD-10-CM | POA: Diagnosis not present

## 2020-01-30 DIAGNOSIS — G300 Alzheimer's disease with early onset: Secondary | ICD-10-CM | POA: Diagnosis not present

## 2020-02-01 DIAGNOSIS — R059 Cough, unspecified: Secondary | ICD-10-CM | POA: Diagnosis not present

## 2020-02-15 DIAGNOSIS — R69 Illness, unspecified: Secondary | ICD-10-CM | POA: Diagnosis not present

## 2020-02-15 DIAGNOSIS — I10 Essential (primary) hypertension: Secondary | ICD-10-CM | POA: Diagnosis not present

## 2020-02-29 DIAGNOSIS — M62830 Muscle spasm of back: Secondary | ICD-10-CM | POA: Diagnosis not present

## 2020-02-29 DIAGNOSIS — R2243 Localized swelling, mass and lump, lower limb, bilateral: Secondary | ICD-10-CM | POA: Diagnosis not present

## 2020-02-29 DIAGNOSIS — G300 Alzheimer's disease with early onset: Secondary | ICD-10-CM | POA: Diagnosis not present

## 2020-02-29 DIAGNOSIS — R69 Illness, unspecified: Secondary | ICD-10-CM | POA: Diagnosis not present

## 2020-03-07 DIAGNOSIS — R2243 Localized swelling, mass and lump, lower limb, bilateral: Secondary | ICD-10-CM | POA: Diagnosis not present

## 2020-03-07 DIAGNOSIS — R69 Illness, unspecified: Secondary | ICD-10-CM | POA: Diagnosis not present

## 2020-03-10 ENCOUNTER — Inpatient Hospital Stay (HOSPITAL_COMMUNITY)
Admission: EM | Admit: 2020-03-10 | Discharge: 2020-05-28 | DRG: 951 | Disposition: A | Discharge status: Skilled Nursing Facility | Payer: 59 | Source: Skilled Nursing Facility | Attending: Internal Medicine | Admitting: Internal Medicine

## 2020-03-10 ENCOUNTER — Emergency Department (HOSPITAL_COMMUNITY): Payer: 59

## 2020-03-10 ENCOUNTER — Other Ambulatory Visit: Payer: Self-pay

## 2020-03-10 DIAGNOSIS — R451 Restlessness and agitation: Secondary | ICD-10-CM | POA: Diagnosis not present

## 2020-03-10 DIAGNOSIS — R8271 Bacteriuria: Secondary | ICD-10-CM | POA: Diagnosis present

## 2020-03-10 DIAGNOSIS — F0391 Unspecified dementia with behavioral disturbance: Secondary | ICD-10-CM

## 2020-03-10 DIAGNOSIS — E440 Moderate protein-calorie malnutrition: Secondary | ICD-10-CM | POA: Diagnosis not present

## 2020-03-10 DIAGNOSIS — F419 Anxiety disorder, unspecified: Secondary | ICD-10-CM | POA: Diagnosis present

## 2020-03-10 DIAGNOSIS — R159 Full incontinence of feces: Secondary | ICD-10-CM | POA: Diagnosis not present

## 2020-03-10 DIAGNOSIS — I10 Essential (primary) hypertension: Secondary | ICD-10-CM | POA: Diagnosis present

## 2020-03-10 DIAGNOSIS — R69 Illness, unspecified: Secondary | ICD-10-CM | POA: Diagnosis not present

## 2020-03-10 DIAGNOSIS — Z515 Encounter for palliative care: Principal | ICD-10-CM

## 2020-03-10 DIAGNOSIS — E785 Hyperlipidemia, unspecified: Secondary | ICD-10-CM | POA: Diagnosis present

## 2020-03-10 DIAGNOSIS — R404 Transient alteration of awareness: Secondary | ICD-10-CM | POA: Diagnosis not present

## 2020-03-10 DIAGNOSIS — S3729XA Other injury of bladder, initial encounter: Secondary | ICD-10-CM | POA: Diagnosis not present

## 2020-03-10 DIAGNOSIS — E669 Obesity, unspecified: Secondary | ICD-10-CM | POA: Diagnosis present

## 2020-03-10 DIAGNOSIS — Z20822 Contact with and (suspected) exposure to covid-19: Secondary | ICD-10-CM | POA: Diagnosis present

## 2020-03-10 DIAGNOSIS — Z79899 Other long term (current) drug therapy: Secondary | ICD-10-CM

## 2020-03-10 DIAGNOSIS — R609 Edema, unspecified: Secondary | ICD-10-CM | POA: Diagnosis not present

## 2020-03-10 DIAGNOSIS — I959 Hypotension, unspecified: Secondary | ICD-10-CM | POA: Diagnosis present

## 2020-03-10 DIAGNOSIS — R339 Retention of urine, unspecified: Secondary | ICD-10-CM | POA: Diagnosis not present

## 2020-03-10 DIAGNOSIS — R001 Bradycardia, unspecified: Secondary | ICD-10-CM | POA: Diagnosis not present

## 2020-03-10 DIAGNOSIS — Z751 Person awaiting admission to adequate facility elsewhere: Secondary | ICD-10-CM

## 2020-03-10 DIAGNOSIS — R4182 Altered mental status, unspecified: Secondary | ICD-10-CM | POA: Diagnosis not present

## 2020-03-10 DIAGNOSIS — B952 Enterococcus as the cause of diseases classified elsewhere: Secondary | ICD-10-CM | POA: Diagnosis present

## 2020-03-10 DIAGNOSIS — R456 Violent behavior: Secondary | ICD-10-CM | POA: Diagnosis present

## 2020-03-10 DIAGNOSIS — B957 Other staphylococcus as the cause of diseases classified elsewhere: Secondary | ICD-10-CM | POA: Diagnosis present

## 2020-03-10 DIAGNOSIS — Z6837 Body mass index (BMI) 37.0-37.9, adult: Secondary | ICD-10-CM

## 2020-03-10 DIAGNOSIS — N281 Cyst of kidney, acquired: Secondary | ICD-10-CM | POA: Diagnosis not present

## 2020-03-10 DIAGNOSIS — Z7982 Long term (current) use of aspirin: Secondary | ICD-10-CM

## 2020-03-10 DIAGNOSIS — I700 Atherosclerosis of aorta: Secondary | ICD-10-CM | POA: Diagnosis not present

## 2020-03-10 DIAGNOSIS — R338 Other retention of urine: Secondary | ICD-10-CM | POA: Diagnosis present

## 2020-03-10 DIAGNOSIS — F0281 Dementia in other diseases classified elsewhere with behavioral disturbance: Secondary | ICD-10-CM | POA: Diagnosis present

## 2020-03-10 DIAGNOSIS — G300 Alzheimer's disease with early onset: Secondary | ICD-10-CM | POA: Diagnosis present

## 2020-03-10 DIAGNOSIS — Z66 Do not resuscitate: Secondary | ICD-10-CM | POA: Diagnosis present

## 2020-03-10 DIAGNOSIS — Z7401 Bed confinement status: Secondary | ICD-10-CM

## 2020-03-10 DIAGNOSIS — R4689 Other symptoms and signs involving appearance and behavior: Secondary | ICD-10-CM | POA: Diagnosis present

## 2020-03-10 DIAGNOSIS — R32 Unspecified urinary incontinence: Secondary | ICD-10-CM | POA: Diagnosis not present

## 2020-03-10 DIAGNOSIS — Z781 Physical restraint status: Secondary | ICD-10-CM

## 2020-03-10 DIAGNOSIS — F0280 Dementia in other diseases classified elsewhere without behavioral disturbance: Secondary | ICD-10-CM | POA: Diagnosis present

## 2020-03-10 DIAGNOSIS — F03918 Unspecified dementia, unspecified severity, with other behavioral disturbance: Secondary | ICD-10-CM

## 2020-03-10 DIAGNOSIS — N3289 Other specified disorders of bladder: Secondary | ICD-10-CM | POA: Diagnosis present

## 2020-03-10 LAB — COMPREHENSIVE METABOLIC PANEL
ALT: 26 U/L (ref 0–44)
AST: 31 U/L (ref 15–41)
Albumin: 3.7 g/dL (ref 3.5–5.0)
Alkaline Phosphatase: 61 U/L (ref 38–126)
Anion gap: 10 (ref 5–15)
BUN: 12 mg/dL (ref 8–23)
CO2: 26 mmol/L (ref 22–32)
Calcium: 9.3 mg/dL (ref 8.9–10.3)
Chloride: 103 mmol/L (ref 98–111)
Creatinine, Ser: 0.92 mg/dL (ref 0.61–1.24)
GFR, Estimated: >60 mL/min (ref >60)
Glucose, Bld: 95 mg/dL (ref 70–99)
Potassium: 4.1 mmol/L (ref 3.5–5.1)
Sodium: 139 mmol/L (ref 135–145)
Total Bilirubin: 1.1 mg/dL (ref 0.3–1.2)
Total Protein: 7.9 g/dL (ref 6.5–8.1)

## 2020-03-10 LAB — CBC WITH DIFFERENTIAL/PLATELET
Abs Immature Granulocytes: 0.01 10*3/uL (ref 0.00–0.07)
Basophils Absolute: 0 10*3/uL (ref 0.0–0.1)
Basophils Relative: 1 %
Eosinophils Absolute: 0.1 10*3/uL (ref 0.0–0.5)
Eosinophils Relative: 1 %
HCT: 43.5 % (ref 39.0–52.0)
Hemoglobin: 14.6 g/dL (ref 13.0–17.0)
Immature Granulocytes: 0 %
Lymphocytes Relative: 36 %
Lymphs Abs: 1.5 10*3/uL (ref 0.7–4.0)
MCH: 33.6 pg (ref 26.0–34.0)
MCHC: 33.6 g/dL (ref 30.0–36.0)
MCV: 100.2 fL — ABNORMAL HIGH (ref 80.0–100.0)
Monocytes Absolute: 0.4 10*3/uL (ref 0.1–1.0)
Monocytes Relative: 9 %
Neutro Abs: 2.2 10*3/uL (ref 1.7–7.7)
Neutrophils Relative %: 53 %
Platelets: 148 10*3/uL — ABNORMAL LOW (ref 150–400)
RBC: 4.34 MIL/uL (ref 4.22–5.81)
RDW: 12.8 % (ref 11.5–15.5)
WBC: 4.1 10*3/uL (ref 4.0–10.5)
nRBC: 0 % (ref 0.0–0.2)

## 2020-03-10 LAB — URINALYSIS, ROUTINE W REFLEX MICROSCOPIC
Bilirubin Urine: NEGATIVE
Glucose, UA: NEGATIVE mg/dL
Hgb urine dipstick: NEGATIVE
Ketones, ur: NEGATIVE mg/dL
Nitrite: NEGATIVE
Protein, ur: NEGATIVE mg/dL
Specific Gravity, Urine: 1.015 (ref 1.005–1.030)
pH: 6 (ref 5.0–8.0)

## 2020-03-10 LAB — URINALYSIS, MICROSCOPIC (REFLEX)
Bacteria, UA: NONE SEEN
Squamous Epithelial / HPF: NONE SEEN (ref 0–5)

## 2020-03-10 LAB — RAPID URINE DRUG SCREEN, HOSP PERFORMED
Amphetamines: NOT DETECTED
Barbiturates: NOT DETECTED
Benzodiazepines: POSITIVE — AB
Cocaine: NOT DETECTED
Opiates: NOT DETECTED
Tetrahydrocannabinol: NOT DETECTED

## 2020-03-10 LAB — ETHANOL: Alcohol, Ethyl (B): <10 mg/dL (ref <10)

## 2020-03-10 LAB — VALPROIC ACID LEVEL: Valproic Acid Lvl: 18 ug/mL — ABNORMAL LOW (ref 50.0–100.0)

## 2020-03-10 LAB — RESP PANEL BY RT-PCR (FLU A&B, COVID) ARPGX2
Influenza A by PCR: NEGATIVE
Influenza B by PCR: NEGATIVE
SARS Coronavirus 2 by RT PCR: NEGATIVE

## 2020-03-10 LAB — CBG MONITORING, ED: Glucose-Capillary: 75 mg/dL (ref 70–99)

## 2020-03-10 MED ORDER — DONEPEZIL HCL 10 MG PO TABS
10.0000 mg | ORAL_TABLET | Freq: Every day | ORAL | Status: DC
  Administered 2020-03-11 – 2020-04-24 (×44): 10 mg via ORAL
  Filled 2020-03-10: qty 2
  Filled 2020-03-10: qty 1
  Filled 2020-03-10: qty 2
  Filled 2020-03-10: qty 1
  Filled 2020-03-10: qty 2
  Filled 2020-03-10 (×12): qty 1
  Filled 2020-03-10: qty 2
  Filled 2020-03-10 (×4): qty 1
  Filled 2020-03-10: qty 2
  Filled 2020-03-10 (×5): qty 1
  Filled 2020-03-10: qty 2
  Filled 2020-03-10 (×3): qty 1
  Filled 2020-03-10: qty 2
  Filled 2020-03-10 (×5): qty 1
  Filled 2020-03-10 (×2): qty 2
  Filled 2020-03-10: qty 1
  Filled 2020-03-10 (×2): qty 2
  Filled 2020-03-10 (×2): qty 1
  Filled 2020-03-10: qty 2
  Filled 2020-03-10: qty 1

## 2020-03-10 MED ORDER — LISINOPRIL 20 MG PO TABS
20.0000 mg | ORAL_TABLET | Freq: Every day | ORAL | Status: DC
  Administered 2020-03-12 – 2020-03-22 (×11): 20 mg via ORAL
  Filled 2020-03-10 (×10): qty 1

## 2020-03-10 MED ORDER — ALPRAZOLAM ER 1 MG PO TB24: 1.0000 mg | ORAL_TABLET | Freq: Every day | ORAL | Status: DC

## 2020-03-10 MED ORDER — ALPRAZOLAM 0.5 MG PO TABS
1.0000 mg | ORAL_TABLET | Freq: Every day | ORAL | Status: DC | PRN
  Administered 2020-03-10 – 2020-03-22 (×4): 1 mg via ORAL
  Filled 2020-03-10 (×3): qty 2
  Filled 2020-03-10: qty 4

## 2020-03-10 MED ORDER — ZIPRASIDONE MESYLATE 20 MG IM SOLR: 20.0000 mg | Freq: Once | INTRAMUSCULAR | Status: AC

## 2020-03-10 MED ORDER — ASPIRIN EC 81 MG PO TBEC
81.0000 mg | DELAYED_RELEASE_TABLET | Freq: Every day | ORAL | Status: DC
  Administered 2020-03-12 – 2020-03-23 (×12): 81 mg via ORAL
  Filled 2020-03-10 (×12): qty 1

## 2020-03-10 MED ORDER — LORAZEPAM 2 MG/ML IJ SOLN
2.0000 mg | Freq: Once | INTRAMUSCULAR | Status: AC
  Administered 2020-03-10: 2 mg via INTRAMUSCULAR
  Filled 2020-03-10: qty 1

## 2020-03-10 MED ORDER — ZIPRASIDONE MESYLATE 20 MG IM SOLR
INTRAMUSCULAR | Status: AC
  Administered 2020-03-11: 20 mg via INTRAMUSCULAR
  Filled 2020-03-10: qty 20

## 2020-03-10 MED ORDER — SERTRALINE HCL 100 MG PO TABS
100.0000 mg | ORAL_TABLET | Freq: Every day | ORAL | Status: DC
  Administered 2020-03-12 – 2020-04-24 (×39): 100 mg via ORAL
  Filled 2020-03-10 (×43): qty 1

## 2020-03-10 MED ORDER — MEMANTINE HCL 10 MG PO TABS
10.0000 mg | ORAL_TABLET | Freq: Two times a day (BID) | ORAL | Status: DC
  Administered 2020-03-10 – 2020-04-24 (×84): 10 mg via ORAL
  Filled 2020-03-10 (×93): qty 1

## 2020-03-10 MED ORDER — BUPROPION HCL ER (XL) 150 MG PO TB24
300.0000 mg | ORAL_TABLET | Freq: Every day | ORAL | Status: DC
  Administered 2020-03-12 – 2020-03-13 (×2): 300 mg via ORAL
  Administered 2020-03-14: 150 mg via ORAL
  Administered 2020-03-15 – 2020-04-24 (×37): 300 mg via ORAL
  Filled 2020-03-10 (×43): qty 2

## 2020-03-10 MED ORDER — ALPRAZOLAM 0.25 MG PO TABS
0.5000 mg | ORAL_TABLET | Freq: Two times a day (BID) | ORAL | Status: DC
  Administered 2020-03-11 – 2020-05-01 (×96): 0.5 mg via ORAL
  Filled 2020-03-10 (×102): qty 2

## 2020-03-10 MED ORDER — DIVALPROEX SODIUM 250 MG PO DR TAB
250.0000 mg | DELAYED_RELEASE_TABLET | Freq: Three times a day (TID) | ORAL | Status: DC
  Administered 2020-03-10 – 2020-04-24 (×117): 250 mg via ORAL
  Filled 2020-03-10 (×133): qty 1

## 2020-03-10 MED ORDER — STERILE WATER FOR INJECTION IJ SOLN
INTRAMUSCULAR | Status: AC
  Filled 2020-03-10: qty 10

## 2020-03-10 MED ORDER — FUROSEMIDE 20 MG PO TABS
20.0000 mg | ORAL_TABLET | Freq: Every day | ORAL | Status: DC
  Administered 2020-03-12 – 2020-03-22 (×11): 20 mg via ORAL
  Filled 2020-03-10 (×10): qty 1

## 2020-03-10 MED ORDER — ZIPRASIDONE MESYLATE 20 MG IM SOLR
INTRAMUSCULAR | Status: AC
  Administered 2020-03-10: 20 mg via INTRAMUSCULAR
  Filled 2020-03-10: qty 20

## 2020-03-10 MED ORDER — BUSPIRONE HCL 15 MG PO TABS
15.0000 mg | ORAL_TABLET | Freq: Three times a day (TID) | ORAL | Status: DC | PRN
  Administered 2020-03-11 – 2020-03-30 (×9): 15 mg via ORAL
  Filled 2020-03-10: qty 1
  Filled 2020-03-10 (×6): qty 2
  Filled 2020-03-10: qty 1
  Filled 2020-03-10 (×2): qty 2

## 2020-03-10 MED ORDER — QUETIAPINE FUMARATE 50 MG PO TABS
100.0000 mg | ORAL_TABLET | Freq: Two times a day (BID) | ORAL | Status: DC
  Administered 2020-03-10 – 2020-05-28 (×149): 100 mg via ORAL
  Filled 2020-03-10 (×2): qty 2
  Filled 2020-03-10: qty 1
  Filled 2020-03-10 (×81): qty 2
  Filled 2020-03-10: qty 1
  Filled 2020-03-10 (×39): qty 2
  Filled 2020-03-10: qty 1
  Filled 2020-03-10 (×24): qty 2
  Filled 2020-03-10: qty 1
  Filled 2020-03-10 (×8): qty 2
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
## 2020-03-10 NOTE — ED Notes (Signed)
Patient transported to CT 
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
## 2020-03-10 NOTE — ED Notes (Addendum)
Please update Nathan Schroeder, son, at [phone] when pt has disposition.

## 2020-03-10 NOTE — ED Provider Notes (Signed)
Pt has had CT - negative Awaiting TTS consultation   Eber Hong, MD 03/10/20 1930

## 2020-03-10 NOTE — BH Assessment (Signed)
This writer attempted to assess at 1530 unsuccessfully due to patient being medicated earlier for agitation. Patient was observed to be sleeping. Patient will be seen later this date.

## 2020-03-10 NOTE — ED Notes (Signed)
Belongings placed in locker 11 

## 2020-03-10 NOTE — BH Assessment (Signed)
TTS Counselor spoke to [employer], RN to put pt in private room to complete assessment.  Nurse will call back when she is ready.

## 2020-03-11 DIAGNOSIS — R451 Restlessness and agitation: Secondary | ICD-10-CM | POA: Diagnosis not present

## 2020-03-11 DIAGNOSIS — F0391 Unspecified dementia with behavioral disturbance: Secondary | ICD-10-CM | POA: Diagnosis not present

## 2020-03-11 DIAGNOSIS — R69 Illness, unspecified: Secondary | ICD-10-CM | POA: Diagnosis not present

## 2020-03-11 DIAGNOSIS — R456 Violent behavior: Secondary | ICD-10-CM | POA: Diagnosis not present

## 2020-03-11 MED ORDER — STERILE WATER FOR INJECTION IJ SOLN
INTRAMUSCULAR | Status: AC
  Filled 2020-03-11: qty 10

## 2020-03-11 MED ORDER — ZIPRASIDONE MESYLATE 20 MG IM SOLR
INTRAMUSCULAR | Status: AC
  Administered 2020-03-11: 20 mg via INTRAMUSCULAR
  Filled 2020-03-11: qty 20

## 2020-03-11 MED ORDER — ZIPRASIDONE MESYLATE 20 MG IM SOLR: 20.0000 mg | Freq: Once | INTRAMUSCULAR | Status: AC
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
## 2020-03-11 NOTE — ED Notes (Signed)
Pt attempting to get out of bed, chewing on sheet, pt placed back into restraints.  Restraints were placed on side rails due to no other option on pt's stretcher. Pt unable to follow commands to swallow pills, chewing medication.  Pt's other medication crushed and placed in applesauce. Pt ate entire applesauce.

## 2020-03-11 NOTE — ED Notes (Signed)
Pt now out of all restraints. Pt eating lunch at this time.

## 2020-03-11 NOTE — ED Provider Notes (Signed)
Pt aggressive and combative towards staff. Will not stay in bed and yelling. Not able to be redirected verbally. Restraints ordered for pt and staff safety. Will give geodon, as pt did well with this last night. Qtc normal.    On reassessment, pt resting comfortably.     .Critical Care Performed by: Alveria Apley, PA-C Authorized by: Alveria Apley, PA-C   Critical care provider statement:    Critical care time (minutes):  35   Critical care time was exclusive of:  Separately billable procedures and treating other patients and teaching time   Critical care was necessary to treat or prevent imminent or life-threatening deterioration of the following conditions:  CNS failure or compromise   Critical care was time spent personally by me on the following activities:  Blood draw for specimens, evaluation of patient's response to treatment, examination of patient, re-evaluation of patient's condition, review of old charts, ordering and performing treatments and interventions, ordering and review of laboratory studies, ordering and review of radiographic studies and pulse oximetry   I assumed direction of critical care for this patient from another provider in my specialty: no   Comments:     Pt aggressive, requiring restrains and geodon for pt and staff safety.       Alveria Apley, PA-C 03/11/20 2324    Zadie Rhine, MD 03/12/20 [phone]

## 2020-03-11 NOTE — ED Notes (Signed)
Restraints were placed on side rail. Well aware that restraints are not to be strapped to bed rails but with placement on the new [employer] beds there were no other options.

## 2020-03-11 NOTE — ED Provider Notes (Signed)
BP 127/83   Pulse 81   Temp 98 F (36.7 C) (Axillary)   Resp 16   SpO2 98%  Patient resting comfortably at this time    Zadie Rhine, MD 03/11/20 0202
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
## 2020-03-11 NOTE — ED Notes (Signed)
Pt fidgeting around in bed, attempting to take off wrist restraint, taking clothes and blankets off,

## 2020-03-11 NOTE — ED Provider Notes (Signed)
Emergency Medicine Observation Re-evaluation Note  Nathan Schroeder is a 60 y.o. male, seen on rounds today.  Pt initially presented to the ED for complaints of No chief complaint on file. Currently, the patient is resting but earlier was quite combative.  Physical Exam  BP 134/88   Pulse 82   Temp 98.2 F (36.8 C)   Resp 19   SpO2 100%  Physical Exam General: no distress Cardiac:  Warm well perfused Lungs: even and unlabored Psych: intermittently agitated  ED Course / MDM  EKG:EKG Interpretation  Date/Time:  Saturday March 10 2020 10:46:44 EST Ventricular Rate:  59 PR Interval:    QRS Duration: 117 QT Interval:  423 QTC Calculation: 419 R Axis:   18 Text Interpretation: Sinus rhythm Nonspecific intraventricular conduction delay Probable anterolateral infarct, old No old tracing to compare Confirmed by Jacalyn Lefevre [phone]) on 03/10/2020 12:18:17 PM    I have reviewed the labs performed to date as well as medications administered while in observation.  Recent changes in the last 24 hours include agitation, psych recommends geropsychiatric hospitalization.  Plan  Current plan is for geropsychiatric hospitalization. Patient is not under full IVC at this time.   Milagros Loll, MD 03/11/20 [phone]

## 2020-03-11 NOTE — BH Assessment (Addendum)
Comprehensive Clinical Assessment (CCA) Note  03/11/2020 Nathan Schroeder [id number]   Visit Diagnosis: Dementia with behavioral disturbance Disposition:  Tonna Corner, NP recommends geropsychiatric hospitalization  Pt presents to the ED today with a behavior disturbance.  Pt has a hx of dementia, onset noted when he was 60 yo per son. Pt moved to [facility] about 2 months ago when wife could no longer care for pt.   By phone, son Nathan Schroeder [phone]) states pt appears at recent baseline, cognitively. And while aggressive behavior could be triggered when receiving personal care, pt's increased level of aggression prior to ED admission was sudden.   Son reports it is typical for pt to repeat "yeah, okay" without seeming to register people. Son states there had been recent medicine changes about 2 weeks ago after consultation with geriatric specialist, Dr. Erick Blinks.   Per Nathan Schroeder, he and his mother would like pt to return to [facility], but if not the family would like to talk to SW dept about the options.  Nathan Schroeder requests updates to him as possible. He reports pt's wife has hearing loss that make phone communication difficult.      CCA Screening, Triage and Referral (STR)  Patient Reported Information How did you hear about [place]? Other (Comment)  Referral name: [facility]  What Is the Reason for Your Visit/Call Today? BIB EMS with behavior disturbance & aggression  How Long Has This Been Causing You Problems? > than 6 months  What Do You Feel Would Help You the Most Today? Other (Comment) (no response)   Have You Recently Been in Any Inpatient Treatment (Hospital/Detox/Crisis Center/28-Day Program)? No  Have You Ever Received Services From [employer] Before? Yes  Who Do You See at [facility]? No data recorded  Have You Recently Had Any Thoughts About Hurting Yourself? -- (UTA, grunts and says "yah, okay" to everything)  Are You  Planning to Commit Suicide/Harm Yourself At This time? -- (UTA, grunts and says "yah, okay" to everything)   Have you Recently Had Thoughts About Hurting Someone Else? -- (UTA, grunts and says "yah, okay" to everything)   Have You Used Any Alcohol or Drugs in the Past 24 Hours? -- (none known)   Do You Currently Have a Therapist/Psychiatrist? No data recorded Name of Therapist/Psychiatrist: No data recorded  Have You Been Recently Discharged From Any Office Practice or Programs? No  Explanation of Discharge From Practice/Program: No data recorded    CCA Screening Triage Referral Assessment Type of Contact: Tele-Assessment  Is this Initial or Reassessment? Initial Assessment  Date Telepsych consult ordered in [facility]:  03/10/2020  Time Telepsych consult ordered in [facility]:  0926   Patient Reported Information Reviewed? Yes   Collateral Involvement: son, Nathan Schroeder [phone]   Does Patient Have a Court Appointed Legal Guardian? No data recorded Name and Contact of Legal Guardian: No data recorded If Minor and Not Living with Parent(s), Who has Custody? No data recorded Is CPS involved or ever been involved? -- (UTA, grunts and says "yah, okay" to everything)  Is APS involved or ever been involved? -- (UTA, grunts and says "yah, okay" to everything)   Patient Determined To Be At Risk for Harm To Self or Others Based on Review of Patient Reported Information or Presenting Complaint? Yes, for Harm to Others  Method: No Plan  Availability of Means: No access or NA  Intent: Vague intent or NA  Notification Required: No need or identified person  Additional Information  for Danger to Others Potential: Active psychosis / dementia Additional Comments for Danger to Others Potential: demential, hx of aggression to staff  Are There Guns or Other Weapons in Your Home? -- (N/A lives in a faciltiy)  Types of Guns/Weapons: No data recorded Are These Weapons Safely Secured?                             No data recorded Who Could Verify You Are Able To Have These Secured: No data recorded Do You Have any Outstanding Charges, Pending Court Dates, Parole/Probation? N/A  Contacted To Inform of Risk of Harm To Self or Others: No data recorded  Location of Assessment: [facility] ED   Does Patient Present under Involuntary Commitment? No  IVC Papers Initial File Date: No data recorded  [place] of Residence: No data recorded  Patient Currently Receiving the Following Services: Medication Management ([facility], Assisted Living/Memory Care)   Determination of Need: Emergent (2 hours)   Options For Referral: Inpatient Hospitalization; Medication Management; Geropsychiatric Facility   CCA Biopsychosocial Intake/Chief Complaint:  hx of dementia with behavioral problems; recent increase in aggression  Current Symptoms/Problems: physically aggressive; deterioration in functioning   Patient Reported Schizophrenia/Schizoaffective Diagnosis in Past: No   Strengths: son supportive   Type of Services Patient Feels are Needed: UTA, grunts and says "yah, okay" to everything   Mental Health Symptoms Depression:  -- (UTA, grunts and says "yah, okay" to everything)   Duration of Depressive symptoms: No data recorded  Mania:  N/A   Anxiety:   -- (UTA, grunts and says "yah, okay" to everything)   Psychosis:  -- (UTA, grunts and says "yah, okay" to everything)   Duration of Psychotic symptoms: No data recorded  Trauma:  -- (UTA, grunts and says "yah, okay" to everything)   Obsessions:  N/A   Compulsions:  N/A   Inattention:  -- (UTA, grunts and says "yah, okay" to everything)   Hyperactivity/Impulsivity:  N/A   Oppositional/Defiant Behaviors:  N/A   Emotional Irregularity:  N/A   Other Mood/Personality Symptoms:  No data recorded   Mental Status Exam Appearance and self-care  Stature:  Average   Weight:  Average weight   Clothing:  -- (hospital attire)    Grooming:  Normal   Cosmetic use:  None   Posture/gait:  Tense; Rigid   Motor activity:  Restless   Sensorium  Attention:  Confused; Unaware   Concentration:  Variable (n/a , grunts and says "yah, okay" to everything)   Orientation:  No data recorded  Recall/memory:  Defective in Immediate; Defective in Short-term; Defective in Recent; Defective in Remote   Affect and Mood  Affect:  Constricted   Mood:  No data recorded  Relating  Eye contact:  None (no eye contact- only looked toward screen once, briefly)   Facial expression:  Tense   Attitude toward examiner:  Uninterested (unaware)   Thought and Language  Speech flow: Garbled   Thought content:  -- (UTA)   Preoccupation:  No data recorded  Hallucinations:  Other (Comment) (appears to be responding to internal stimuli)   Organization:  No data recorded  [employer] of Knowledge:  -- (dementia dx -UTA)   Intelligence:  No data recorded  Abstraction:  No data recorded  Judgement:  Impaired   Reality Testing:  Unaware   Insight:  None/zero insight   Decision Making:  Confused   Social Functioning  Social  Maturity:  Isolates; Impulsive   Social Judgement:  Heedless   Stress  Stressors:  Other (Comment) (cognitive impairment)   Coping Ability:  Deficient supports   Skill Deficits:  Activities of daily living; Communication; Decision making; Interpersonal; Self-control; Self-care; Responsibility   Supports:  Family      CCA Family/Childhood History Family and Relationship History: Family history Marital status: Married Does patient have children?: Yes How is patient's relationship with their children?: son, Nathan Schroeder, helping coordinate care  Childhood History:  Childhood History By whom was/is the patient raised?:  (UTA) Does patient have siblings?:  (UTA) Did patient suffer any verbal/emotional/physical/sexual abuse as a child?:  (UTA) Did patient suffer from severe childhood  neglect?:  (UTA) Has patient ever been sexually abused/assaulted/raped as an adolescent or adult?:  (UTA) Was the patient ever a victim of a crime or a disaster?:  (UTA) Witnessed domestic violence?:  (UTA) Has patient been affected by domestic violence as an adult?:  (UTA)   CCA Substance Use Alcohol/Drug Use: Alcohol / Drug Use Pain Medications: SEE MAR Prescriptions: SEE MAR Over the Counter: SEE MAR History of alcohol / drug use?:  (UTA)    DSM5 Diagnoses: Patient Active Problem List   Diagnosis Date Noted  . Ventral hernia without obstruction or gangrene 04/13/2019  . Tinea pedis of both feet 04/13/2019  . Iron excess 04/13/2019  . Hyperlipidemia LDL goal <100 04/13/2019  . Localized swelling of both lower legs 04/13/2019  . BMI 40.0-44.9, adult (HCC) 04/13/2019  . Frequent urination 04/13/2019  . Mild cognitive impairment with memory loss 02/03/2013  . Hypertension 07/19/2012  . Memory loss 07/19/2012   Disposition:  Tonna Corner, NP recommends geropsychiatric hospitalization  Nathan Schroeder Suzan Nailer, LCSW

## 2020-03-11 NOTE — ED Notes (Signed)
Breakfast Ordered 

## 2020-03-11 NOTE — ED Notes (Signed)
Pt bil lower extremities released from restraints at this time.

## 2020-03-11 NOTE — ED Notes (Signed)
Pt answering all questions with "yes, okay, or I guess"

## 2020-03-11 NOTE — BH Assessment (Signed)
TTS Counselor attempted to complete an assessment, unable due to belligerent and combative presented by the patient. Pt threw different items at the Tele-assessment machine. Pt was not dressed, he laid naked and threw his sheet at the Tele-assessment machine.  Pt motor activity was restless, reckless and agitated. Pt was not responses, his speech was not clear unable to answer questions.   Thought process was not coherent and scattered.  Pt insight was poor.

## 2020-03-11 NOTE — ED Notes (Addendum)
Pt having TTS consult.pt remains in four point restraints

## 2020-03-11 NOTE — ED Notes (Signed)
Went to adjust pt in bed and replace brief and linen with RNx3.  Pt attempting to kick and grab and swing at staff when restraint removed. Restraints replaced.

## 2020-03-11 NOTE — ED Notes (Signed)
Spoke with pt's son per request.

## 2020-03-12 DIAGNOSIS — R451 Restlessness and agitation: Secondary | ICD-10-CM | POA: Diagnosis not present

## 2020-03-12 DIAGNOSIS — R69 Illness, unspecified: Secondary | ICD-10-CM | POA: Diagnosis not present

## 2020-03-12 DIAGNOSIS — R456 Violent behavior: Secondary | ICD-10-CM | POA: Diagnosis not present

## 2020-03-12 DIAGNOSIS — F0391 Unspecified dementia with behavioral disturbance: Secondary | ICD-10-CM | POA: Diagnosis not present
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
## 2020-03-12 NOTE — ED Notes (Signed)
Dinner Tray Ordered @ 1749. 

## 2020-03-12 NOTE — Progress Notes (Signed)
Pt meets inpatient criteria per Lanier Prude, NP. Referral information has been sent to the following hospitals for review:  [facility]  [facility]  [facility]  [facility]  [facility]  [facility]  [facility]  [facility]  [facility]     Disposition will continue to follow.   Wells Guiles, MSW, LCSW, LCAS Clinical Social Worker II Disposition CSW [phone]

## 2020-03-12 NOTE — ED Notes (Signed)
Pt continues to sleep comfortably and remain in bed. Restraints removed from all 4 limbs. Respirations even, unlabored. Will continue to monitor for need for reapplication of restraints.

## 2020-03-12 NOTE — ED Notes (Signed)
Bilateral ankle restraints removed from pt.

## 2020-03-12 NOTE — ED Notes (Signed)
Pt has remained out of restraints all shift ,

## 2020-03-12 NOTE — ED Provider Notes (Signed)
Emergency Medicine Observation Re-evaluation Note  JASAI SORG is a 60 y.o. male, seen on rounds today.  Pt initially presented to the ED for complaints of No chief complaint on file. Currently, the patient is continuing to be confused, restless and has urinated on his bed.  Physical Exam  BP (!) 130/99   Pulse 64   Temp 99.4 F (37.4 C) (Axillary)   Resp 18   SpO2 99%  Physical Exam General: Confused Cardiac: Normal heart rate Lungs: Normal respiratory rate Psych: Responds to calming measures  ED Course / MDM  EKG:EKG Interpretation  Date/Time:  Saturday March 10 2020 10:46:44 EST Ventricular Rate:  59 PR Interval:    QRS Duration: 117 QT Interval:  423 QTC Calculation: 419 R Axis:   18 Text Interpretation: Sinus rhythm Nonspecific intraventricular conduction delay Probable anterolateral infarct, old No old tracing to compare Confirmed by Jacalyn Lefevre [phone]) on 03/10/2020 12:18:17 PM    I have reviewed the labs performed to date as well as medications administered while in observation.  Recent changes in the last 24 hours include none.  Currently taking oral medications successfully.  Plan  Current plan is for placement in a geriatric psychiatric facility. Patient is not under full IVC at this time.   Mancel Bale, MD 03/12/20 1113
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
## 2020-03-13 NOTE — ED Notes (Signed)
Pt pulled all blankets and gown off again. Pt took diaper off. Gown, diaper and blankets replaced.

## 2020-03-13 NOTE — ED Notes (Signed)
Pt resting quietly with eyes closed. Resp even and non-labored. Will continue to monitor.  

## 2020-03-13 NOTE — ED Notes (Signed)
Patient was given sips of Ginger Ale.

## 2020-03-13 NOTE — ED Notes (Signed)
In to see pt. Pt cooperative at this time. Pt answers "um, yeah" to all questions. Flat affect. Pt not in restraints, and per report, has not been in restraints all day. VS have been stable. Will continue to monitor.

## 2020-03-13 NOTE — ED Notes (Signed)
Pt awake and pulling all blankets and clothes off. Pt trying to get out of bed. Pt not violent at this time. Gown put back on pt, blankets put back on.

## 2020-03-13 NOTE — ED Notes (Signed)
Pt wife left
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
## 2020-03-13 NOTE — ED Notes (Signed)
Patient transferred to hospital bed for comfort-Monique,RN

## 2020-03-13 NOTE — ED Notes (Signed)
Pt pulled up in bed by security. Pt continued to try and get out of bed and threw a sheet. After some coaxing, this  RN gave the pt medication in applesauce.

## 2020-03-13 NOTE — ED Notes (Signed)
Pt wife at bedside.

## 2020-03-13 NOTE — ED Notes (Signed)
Pt removed diaper again. This time urinated on blankets. Wet blankets removed and replaced with dry ones and new diaper. Pt cleaned up.

## 2020-03-14 NOTE — BH Assessment (Signed)
Pt remains at the ED due to behavioral disturbance and confusion.  Pt was reassessed today.  He sat upright, was bare-chested, stared ahead.  Pt responded to questions only by saying, ''Yes.''  When asked what brought him to the hospital, Pt mumbled and stopped speaking.  Recommend continued inpatient.

## 2020-03-14 NOTE — Progress Notes (Signed)
Referral information has been re-faxed to the following hospitals for review:  [facility]    [facility]  [facility]  [facility]  [facility]  [facility]  [facility]  [facility]  [facility]  [facility]  [facility]  [facility]  [facility]       Disposition will continue to follow.    Wells Guiles, MSW, LCSW, LCAS Clinical Social Worker II Disposition CSW [phone]

## 2020-03-14 NOTE — ED Notes (Signed)
Help get patient straighten up in bedside patient is now sleeping

## 2020-03-14 NOTE — ED Notes (Signed)
Geri psych/violent Breakfast order placed

## 2020-03-14 NOTE — ED Notes (Signed)
Wife at bedside.  Pt responds positively to wife.  Wife feeding pt.

## 2020-03-15 DIAGNOSIS — R456 Violent behavior: Secondary | ICD-10-CM | POA: Diagnosis not present

## 2020-03-15 DIAGNOSIS — R69 Illness, unspecified: Secondary | ICD-10-CM | POA: Diagnosis not present

## 2020-03-15 DIAGNOSIS — F0391 Unspecified dementia with behavioral disturbance: Secondary | ICD-10-CM | POA: Diagnosis not present

## 2020-03-15 DIAGNOSIS — R451 Restlessness and agitation: Secondary | ICD-10-CM | POA: Diagnosis not present

## 2020-03-15 NOTE — ED Provider Notes (Signed)
Emergency Medicine Observation Re-evaluation Note  Nathan Schroeder is a 60 y.o. male, seen on rounds today.  Pt initially presented to the ED for complaints of No chief complaint on file. Currently, the patient is resting in bed.  Patient denies any complaints this morning.  He is watching TV.  Physical Exam  BP (!) 107/46 (BP Location: Right Arm)   Pulse 79   Temp 98.3 F (36.8 C) (Axillary)   Resp 20   SpO2 97%  Physical Exam General: Calm, no distress Cardiac: Regular rate and rhythm Lungs: Lungs clear to auscultation Psych: Flat affect  ED Course / MDM  EKG:EKG Interpretation  Date/Time:  Saturday March 10 2020 10:46:44 EST Ventricular Rate:  59 PR Interval:    QRS Duration: 117 QT Interval:  423 QTC Calculation: 419 R Axis:   18 Text Interpretation: Sinus rhythm Nonspecific intraventricular conduction delay Probable anterolateral infarct, old No old tracing to compare Confirmed by Jacalyn Lefevre [phone]) on 03/10/2020 12:18:17 PM    I have reviewed the labs performed to date as well as medications administered while in observation.  Recent changes in the last 24 hours includeReassessment by psychiatry team.  Continued recommendation for inpatient treatment.  Plan  Current plan is for inpatient psychiatric treatment.    Linwood Dibbles, MD 03/15/20 1017

## 2020-03-15 NOTE — ED Notes (Signed)
Pt incontinent of bladder. Linen and gown changed. Peri care provided and brief changed. Pt cooperative at this time.

## 2020-03-15 NOTE — BH Assessment (Signed)
03/15/20:   Patient re-assessed on this day. Pt remains at the ED due to behavioral disturbance and confusion upon chart review.  Clinician observed patient laying in the hospital bed. He was bare chested and wearing a brief. He did not respond to any of this clinicians questions. Clinician did hear patient mumble something that was not recognizable. He is calm.  Marciano Sequin, NP, continues to recommend [facility].

## 2020-03-15 NOTE — BH Assessment (Signed)
TTS Clinician attempted to see patient at 1755. However, nursing states that cart is being utilized by another patient. Nursing to notify TTS staff when cart is set up and patient is available to be seen.

## 2020-03-16 DIAGNOSIS — F0391 Unspecified dementia with behavioral disturbance: Secondary | ICD-10-CM

## 2020-03-16 DIAGNOSIS — F03918 Unspecified dementia, unspecified severity, with other behavioral disturbance: Secondary | ICD-10-CM

## 2020-03-16 DIAGNOSIS — R4689 Other symptoms and signs involving appearance and behavior: Secondary | ICD-10-CM | POA: Diagnosis present

## 2020-03-16 DIAGNOSIS — F0280 Dementia in other diseases classified elsewhere without behavioral disturbance: Secondary | ICD-10-CM | POA: Diagnosis present

## 2020-03-16 LAB — URINALYSIS, ROUTINE W REFLEX MICROSCOPIC
Bacteria, UA: NONE SEEN
Bilirubin Urine: NEGATIVE
Glucose, UA: NEGATIVE mg/dL
Hgb urine dipstick: NEGATIVE
Ketones, ur: NEGATIVE mg/dL
Nitrite: NEGATIVE
Protein, ur: 30 mg/dL — AB
Specific Gravity, Urine: 1.027 (ref 1.005–1.030)
pH: 5 (ref 5.0–8.0)

## 2020-03-16 NOTE — ED Notes (Signed)
Pt resting. Nursing sitter to assist pt with eating lunch, pt jerking away and not engaging in eating lunch at this time. Will continue to monitor.

## 2020-03-16 NOTE — ED Notes (Signed)
Noted that pt has not had urine output this shift, bladder scan obtained, 440 ml. EDP Long notified, in and out cath order received.

## 2020-03-16 NOTE — ED Provider Notes (Signed)
Emergency Medicine Observation Re-evaluation Note  Nathan Schroeder is a 60 y.o. male, seen on rounds today.  Pt initially presented to the ED for complaints of No chief complaint on file. Currently, the patient is calm and watching TV.  Physical Exam  BP 122/72 (BP Location: Right Arm)   Pulse (!) 45   Temp 98.5 F (36.9 C) (Oral)   Resp 16   SpO2 91%  Physical Exam General: Calm. No distress.  Cardiac: Well perfused.  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  Recent changes in the last 24 hours include patient continues to meet inpatient Geri Psych criteria.  03:46 PM Made aware by nursing that the patient has not urinated today. Bladder scan ~ 440 ml of urine. Plan for in and out.   UA negative. Continue to follow urine output.   Plan  Current plan is for placement w/ Geri Psych.    Maia Plan, MD 03/17/20 [phone]

## 2020-03-16 NOTE — ED Notes (Signed)
Tele psych machine to bedside  

## 2020-03-16 NOTE — ED Notes (Signed)
Verified with charge, [place] RN, OK for pt wife to visit pt, pt wife at bedside.

## 2020-03-16 NOTE — ED Notes (Signed)
Nursing sitter assist pt with dinner. Informed by nursing sitter that pt ate 50% of dinner.

## 2020-03-16 NOTE — Progress Notes (Signed)
Referral information has been re-faxed and remains under review at the following hospitals for review:  [facility]    [facility]  [facility]  [facility] [facility]  [facility]  [facility]  [facility]  [facility]  [facility]  [facility]  [facility]  [facility]  [facility]       Disposition will continue to follow.   Signed:  Corky Crafts, MSW, [place], LCASA 03/16/2020 11:35 AM
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
## 2020-03-17 ENCOUNTER — Emergency Department (HOSPITAL_COMMUNITY): Payer: 59

## 2020-03-17 DIAGNOSIS — R339 Retention of urine, unspecified: Secondary | ICD-10-CM | POA: Diagnosis not present

## 2020-03-17 DIAGNOSIS — N281 Cyst of kidney, acquired: Secondary | ICD-10-CM | POA: Diagnosis not present

## 2020-03-17 DIAGNOSIS — I700 Atherosclerosis of aorta: Secondary | ICD-10-CM | POA: Diagnosis not present

## 2020-03-17 LAB — BASIC METABOLIC PANEL
Anion gap: 11 (ref 5–15)
BUN: 25 mg/dL — ABNORMAL HIGH (ref 8–23)
CO2: 23 mmol/L (ref 22–32)
Calcium: 9.1 mg/dL (ref 8.9–10.3)
Chloride: 100 mmol/L (ref 98–111)
Creatinine, Ser: 0.99 mg/dL (ref 0.61–1.24)
GFR, Estimated: >60 mL/min (ref >60)
Glucose, Bld: 100 mg/dL — ABNORMAL HIGH (ref 70–99)
Potassium: 4.2 mmol/L (ref 3.5–5.1)
Sodium: 134 mmol/L — ABNORMAL LOW (ref 135–145)

## 2020-03-17 NOTE — Care Management (Signed)
Per Nyra Jabs, NP - patient meets inpatient [facility] psych criteria.  Per [facility], no appropriate beds at [facility] and the patient should be faxed to other facilities.   Patient has been sent out and the writer will follow up on the referrals .

## 2020-03-17 NOTE — ED Notes (Signed)
No urinary output noted this shift.

## 2020-03-17 NOTE — ED Notes (Signed)
Pt's medications were crushed and placed in applesauce.

## 2020-03-17 NOTE — ED Notes (Signed)
Patient transported to CT 

## 2020-03-17 NOTE — ED Provider Notes (Signed)
Emergency Medicine Observation Re-evaluation Note  Nathan Schroeder is a 60 y.o. male, seen on rounds today.  Pt initially presented to the ED for complaints of No chief complaint on file. Currently, the patient is awaiting [facility] psych placement.  Physical Exam  BP 106/61   Pulse 62   Temp 97.7 F (36.5 C) (Oral)   Resp 16   SpO2 95%  Physical Exam General: Alert Cardiac: Normal rate and rhythm Lungs: No distress Psych: Calm and cooperative  ED Course / MDM  EKG:EKG Interpretation  Date/Time:  Saturday March 10 2020 10:46:44 EST Ventricular Rate:  59 PR Interval:    QRS Duration: 117 QT Interval:  423 QTC Calculation: 419 R Axis:   18 Text Interpretation: Sinus rhythm Nonspecific intraventricular conduction delay Probable anterolateral infarct, old No old tracing to compare Confirmed by Jacalyn Lefevre [phone]) on 03/10/2020 12:18:17 PM  Clinical Course as of 03/17/20 1209  Sat Mar 17, 2020  1155 Bladder scan shows over 400 cc urine. [SJ]    Clinical Course User Index [SJ] Anselm Pancoast, PA-C     CT Renal Stone Study  Result Date: 03/17/2020 CLINICAL DATA:  60 year old male with acute pain and urinary retention. EXAM: CT ABDOMEN AND PELVIS WITHOUT CONTRAST TECHNIQUE: Multidetector CT imaging of the abdomen and pelvis was performed following the standard protocol without IV contrast. COMPARISON:  None. FINDINGS: Please note that parenchymal abnormalities may be missed without intravenous contrast. Lower chest: Mild bibasilar atelectasis/scarring is identified. Coronary artery atherosclerotic calcifications are identified. Hepatobiliary: The liver and gallbladder are unremarkable. No biliary dilatation. Pancreas: Unremarkable Spleen: Unremarkable Adrenals/Urinary Tract: Mild bilateral perinephric stranding is of uncertain chronicity. There is no evidence of hydronephrosis or urinary calculi. LEFT parapelvic cysts are present. The adrenal glands and bladder are unremarkable.  Stomach/Bowel: Stomach is within normal limits. Appendix appears normal. No evidence of bowel wall thickening, distention, or inflammatory changes. Vascular/Lymphatic: Aortic atherosclerosis. No enlarged abdominal or pelvic lymph nodes. Reproductive: Prostate is unremarkable. Other: No ascites, pneumoperitoneum or focal collection. Musculoskeletal: No acute or suspicious bony abnormalities. IMPRESSION: 1. Mild bilateral perinephric stranding of uncertain chronicity. No evidence of hydronephrosis or urinary calculi. 2. Coronary artery disease. 3. Aortic Atherosclerosis (ICD10-I70.0). Electronically Signed   By: Harmon Pier M.D.   On: 03/17/2020 10:44   BUN  Date Value Ref Range Status  03/17/2020 25 (H) 8 - 23 mg/dL Final  84/16/6063 12 8 - 23 mg/dL Final  01/60/1093 14 8 - 27 mg/dL Final   Creatinine, Ser  Date Value Ref Range Status  03/17/2020 0.99 0.61 - 1.24 mg/dL Final  23/55/7322 0.25 0.61 - 1.24 mg/dL Final  42/70/6237 6.28 0.76 - 1.27 mg/dL Final     I have reviewed the labs performed to date as well as medications administered while in observation.      Plan  Current plan is for placement with [facility] psych. Patient is not under full IVC at this time.   Patient was medically cleared on February 12.  Urinary retention: Over the last few days, patient has been noted to have urinary retention with bladder scan results between 200 and over 700 cc of urine.  He has been tolerating in and out caths. Due to the patient's dementia, we suspect he may not tolerate persistent Foley catheter placement.  Therefore, we will continue to perform scheduled bladder scans and in and out caths, as necessary. UA yesterday following in and out cath showed 30 protein and trace leukocytes. Ordered CT renal stone study  today.  Mild bilateral perinephric stranding without discrete obstruction noted.  Suspect this may be from outflow obstruction. Creatinine still within normal limits.  BUN slightly elevated  above previous.  Since patient has been here for several days and has not yet had PT ordered, this order was placed.  Discussed patient and plan with Dr. Rubin Payor, EDP.        Concepcion Living 03/17/20 1211    Benjiman Core, MD 03/17/20 2049

## 2020-03-17 NOTE — ED Notes (Signed)
PT at bedside.

## 2020-03-17 NOTE — Care Management (Signed)
Writer referred patient to [facility].

## 2020-03-17 NOTE — Evaluation (Signed)
Physical Therapy Evaluation Patient Details Name: Nathan Schroeder MRN: [id number] DOB: Feb 10, 1960 Today's Date: 03/17/2020   History of Present Illness  60 y.o. male admitted on 03/10/20 fordementia with behavioral disturbance.  Pt from [facility] ALF.  Pt with significant PMH of HTN, anxiety, Alzheimer's disease.  Clinical Impression  Two person assist to sit EOB and unable to get Nathan Schroeder to stand despite many attempts, multimodal cues and two people.  It seems like he was ambulatory PTA based on notes and chart review.  I asked for OT assessment as well as there are some folks on their staff who work well with dementia patients and can try as well to encourage mobility.  PT to follow acutely for deficits listed below.      Follow Up Recommendations SNF    Equipment Recommendations  Hospital bed    Recommendations for Other Services OT consult     Precautions / Restrictions Precautions Precautions: Fall      Mobility  Bed Mobility Overal bed mobility: Needs Assistance Bed Mobility: Supine to Sit;Sit to Supine     Supine to sit: Max assist;+2 for physical assistance;HOB elevated Sit to supine: Max assist;+2 for physical assistance   General bed mobility comments: Two person max assist to come up to sitting EOB with multimodal cues and HOB maximally elevated, max two person assist to return to bed and total A +2 to scoot HOB.    Transfers                 General transfer comment: attempted sevral different ways to get pt to stand, but easily distracted and unable to process what I wanted him to do, multimodal gestural cues used to try, but ultimately unable even with two people assisting.  Ambulation/Gait                Stairs            Wheelchair Mobility    Modified Rankin (Stroke Patients Only)       Balance Overall balance assessment: Needs assistance Sitting-balance support: Feet supported;Bilateral upper extremity  supported Sitting balance-Leahy Scale: Poor Sitting balance - Comments: leaning to the R two person assist to get to sitting, but could be supervision momentarily. Postural control: Right lateral lean                                   Pertinent Vitals/Pain Pain Assessment: Faces Faces Pain Scale: No hurt    Home Living Family/patient expects to be discharged to:: Assisted living               Home Equipment: None      Prior Function Level of Independence: Needs assistance   Gait / Transfers Assistance Needed: likely ambulatory per chart review (he was going to OP appointments last march)           Hand Dominance        Extremity/Trunk Assessment   Upper Extremity Assessment Upper Extremity Assessment: Overall WFL for tasks assessed    Lower Extremity Assessment Lower Extremity Assessment: Generalized weakness (difficult to formally assess due to decreased ability to follow commands)    Cervical / Trunk Assessment Cervical / Trunk Assessment: Normal  Communication   Communication: Other (comment) (one word responses)  Cognition Arousal/Alertness: Awake/alert Behavior During Therapy: Flat affect Overall Cognitive Status: Impaired/Different from baseline Area of Impairment: Orientation;Attention;Memory;Following commands;Safety/judgement;Awareness;Problem solving  Orientation Level: Disoriented to;Person;Place;Time;Situation Current Attention Level: Focused Memory: Decreased recall of precautions;Decreased short-term memory Following Commands: Follows one step commands inconsistently Safety/Judgement: Decreased awareness of safety;Decreased awareness of deficits Awareness: Intellectual Problem Solving: Slow processing;Decreased initiation;Difficulty sequencing;Requires verbal cues;Requires tactile cues General Comments: Attempted multimodal cues to get him to come EOB, pt stating a few one word responses, but otherwise  having difficulty following what I wanted him to do.      General Comments General comments (skin integrity, edema, etc.): Pt cooperative albeit confused.  No signs of aggression during our attempts.    Exercises     Assessment/Plan    PT Assessment Patient needs continued PT services  PT Problem List Decreased strength;Decreased activity tolerance;Decreased balance;Decreased mobility;Decreased cognition;Decreased safety awareness;Decreased knowledge of precautions       PT Treatment Interventions DME instruction;Gait training;Stair training;Functional mobility training;Therapeutic exercise;Therapeutic activities;Balance training;Neuromuscular re-education;Patient/family education;Cognitive remediation    PT Goals (Current goals can be found in the Care Plan section)  Acute Rehab PT Goals Patient Stated Goal: unable to state PT Goal Formulation: Patient unable to participate in goal setting Time For Goal Achievement: 03/31/20 Potential to Achieve Goals: Fair    Frequency Min 2X/week   Barriers to discharge        Co-evaluation               AM-PAC PT "6 Clicks" Mobility  Outcome Measure Help needed turning from your back to your side while in a flat bed without using bedrails?: A Lot Help needed moving from lying on your back to sitting on the side of a flat bed without using bedrails?: A Lot Help needed moving to and from a bed to a chair (including a wheelchair)?: A Lot Help needed standing up from a chair using your arms (e.g., wheelchair or bedside chair)?: Total Help needed to walk in hospital room?: Total Help needed climbing 3-5 steps with a railing? : Total 6 Click Score: 9    End of Session   Activity Tolerance: Patient tolerated treatment well Patient left: in bed;with nursing/sitter in room Psychiatrist) Nurse Communication: Mobility status PT Visit Diagnosis: Muscle weakness (generalized) (M62.81);Difficulty in walking, not elsewhere classified (R26.2)     Time: 7371-0626 PT Time Calculation (min) (ACUTE ONLY): 18 min   Charges:   PT Evaluation $PT Eval Moderate Complexity: 1 Mod          Corinna Capra, PT, DPT  Acute Rehabilitation [phone] pager [phone]) [phone] office

## 2020-03-17 NOTE — ED Notes (Signed)
Breakfast Ordered 

## 2020-03-17 NOTE — ED Notes (Signed)
lunch provided.  
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
## 2020-03-17 NOTE — ED Notes (Signed)
Sitter assisting pt with breakfast. Will continue to monitor.

## 2020-03-17 NOTE — ED Notes (Signed)
Pt offered PO fluids. Unable to get patient to drink any fluids.

## 2020-03-17 NOTE — ED Notes (Signed)
Pt returned to floor

## 2020-03-17 NOTE — ED Notes (Signed)
RN and NT scooted pt up in bed. Pt was compliant

## 2020-03-18 DIAGNOSIS — R451 Restlessness and agitation: Secondary | ICD-10-CM | POA: Diagnosis not present

## 2020-03-18 DIAGNOSIS — R456 Violent behavior: Secondary | ICD-10-CM | POA: Diagnosis not present

## 2020-03-18 DIAGNOSIS — R69 Illness, unspecified: Secondary | ICD-10-CM | POA: Diagnosis not present

## 2020-03-18 DIAGNOSIS — F0391 Unspecified dementia with behavioral disturbance: Secondary | ICD-10-CM | POA: Diagnosis not present

## 2020-03-18 NOTE — ED Notes (Signed)
Breakfast ordered 

## 2020-03-18 NOTE — ED Notes (Signed)
Lunch Tray Ordered @ 1016. 

## 2020-03-18 NOTE — BH Assessment (Signed)
TTS attempted to re-assess patient. Magnus Ivan, RN, stated the patient was resting. She will notify TTS when patient is awake an able to be seen.

## 2020-03-18 NOTE — ED Notes (Signed)
Service Resource called @ 1803-Dinner Tray Ordered. 

## 2020-03-18 NOTE — ED Provider Notes (Signed)
  Physical Exam  BP 105/62 (BP Location: Right Arm)   Pulse (!) 57   Temp 97.9 F (36.6 C)   Resp 18   Wt 124.9 kg   SpO2 94%   BMI 37.34 kg/m   Physical Exam  ED Course/Procedures   Clinical Course as of 03/18/20 1335  Sat Mar 17, 2020  1155 Bladder scan shows over 400 cc urine. [SJ]    Clinical Course User Index [SJ] Joy, Shawn C, PA-C    Procedures  MDM  Patient has been resting.  Reportedly somewhat difficult to arouse initially.  However went to see the patient and he was awake and eating.  Still pending placement      Benjiman Core, MD 03/18/20 1335

## 2020-03-18 NOTE — BHH Counselor (Signed)
Pt remains at the ED due to bizarre behavior.  Pt was reassessed today.  He stared ahead and was twisting a portion of his bed sheet.  When asked how he was, Pt responded, ''Yes.''  Author asked if he could remember what brought him to the hospital, and Pt stated, ''Yes.'' Pt responded, ''OK'' when asked how he felt.  When asked what brought him into the hospital (what specific events), Pt responded, ''Yes.''  Pt appears to have no insight into the reason for his stay.  Recommend continued inpatient.

## 2020-03-18 NOTE — ED Notes (Signed)
Bladder scanned pt. 538 ml

## 2020-03-19 LAB — URINE CULTURE: Culture: >=100000 — AB

## 2020-03-19 NOTE — ED Notes (Signed)
Ice Cream and [place] Yogurt was added to patient Lunch.

## 2020-03-19 NOTE — ED Notes (Signed)
Pt resting. Even Resp. No distress noted at this time. Meal tray given. Sitter at bedside. Will continue to monitor.

## 2020-03-19 NOTE — ED Notes (Signed)
Patient refused to eat Lunch.

## 2020-03-19 NOTE — ED Notes (Signed)
RN tried to feed patient.

## 2020-03-19 NOTE — ED Notes (Signed)
Pt took off gown.

## 2020-03-19 NOTE — ED Notes (Signed)
Pt took medication crushed in applesauce.  Jane NT called dietary to get ice cream for medication administration.

## 2020-03-19 NOTE — ED Notes (Signed)
Pt corporative and calm at this time. Pt receptive to staff when tasks are performed when each step is explained, and time is given pt to verbalize "yes". Staff able to check pt brief, adjusted bedding and reposition pt in bed without incident.

## 2020-03-19 NOTE — ED Notes (Signed)
Patient drink a half cup of Ginger Ale.

## 2020-03-19 NOTE — ED Provider Notes (Signed)
Emergency Medicine Observation Re-evaluation Note  Nathan Schroeder is a 60 y.o. male, seen on rounds today.  Pt initially presented to the ED for complaints of No chief complaint on file. Currently, the patient is awaiting [facility] psych placement.  Physical Exam  BP 112/69   Pulse 65   Temp 98.7 F (37.1 C) (Axillary)   Resp 18   Wt 124.9 kg   SpO2 93%   BMI 37.34 kg/m  Physical Exam General: Alert Cardiac: Normal rate and rhythm Lungs: No distress Psych: Calm and cooperative  ED Course / MDM  EKG:EKG Interpretation  Date/Time:  Saturday March 10 2020 10:46:44 EST Ventricular Rate:  59 PR Interval:    QRS Duration: 117 QT Interval:  423 QTC Calculation: 419 R Axis:   18 Text Interpretation: Sinus rhythm Nonspecific intraventricular conduction delay Probable anterolateral infarct, old No old tracing to compare Confirmed by Jacalyn Lefevre [phone]) on 03/10/2020 12:18:17 PM  Clinical Course as of 03/19/20 0937  Sat Mar 17, 2020  1155 Bladder scan shows over 400 cc urine. [SJ]    Clinical Course User Index [SJ] Joy, Shawn C, PA-C   I have reviewed the labs performed to date as well as medications administered while in observation.  Recent changes in the last 24 hours include N/A  Plan  Current plan is for pending geri psych placement. Patient is not under full IVC at this time.   Tanda Rockers, PA-C 03/20/20 1205    Mancel Bale, MD 03/21/20 [phone]

## 2020-03-19 NOTE — ED Notes (Signed)
Pt awake and sitting up in bed. Pt assisted with eating. Pt ate full cup of applesauce.. Pt calm at this time.

## 2020-03-19 NOTE — ED Notes (Signed)
Per Carollee Herter CSW , [place] facility will come 03/20/20 around 1300 to reassess pt. Will do assessment through face time.

## 2020-03-19 NOTE — Progress Notes (Signed)
CSW contacted Diane Daigle with [facility]. Ms. Nathan Schroeder stated that she needed to setup a time to re-assess patient in order for the family to stop making payments. Ms. Nathan Schroeder stated she will conduct the assessment over facetime tomorrow at 1:00 PM.

## 2020-03-20 DIAGNOSIS — R451 Restlessness and agitation: Secondary | ICD-10-CM | POA: Diagnosis not present

## 2020-03-20 DIAGNOSIS — R69 Illness, unspecified: Secondary | ICD-10-CM | POA: Diagnosis not present

## 2020-03-20 DIAGNOSIS — F0391 Unspecified dementia with behavioral disturbance: Secondary | ICD-10-CM | POA: Diagnosis not present

## 2020-03-20 DIAGNOSIS — R456 Violent behavior: Secondary | ICD-10-CM | POA: Diagnosis not present

## 2020-03-20 LAB — URINALYSIS, ROUTINE W REFLEX MICROSCOPIC
Bilirubin Urine: NEGATIVE
Glucose, UA: NEGATIVE mg/dL
Hgb urine dipstick: NEGATIVE
Ketones, ur: NEGATIVE mg/dL
Nitrite: NEGATIVE
Protein, ur: 30 mg/dL — AB
Specific Gravity, Urine: 1.029 (ref 1.005–1.030)
pH: 5 (ref 5.0–8.0)

## 2020-03-20 NOTE — ED Notes (Signed)
Urine culture sent w/ urine sample.

## 2020-03-20 NOTE — ED Notes (Signed)
This RN was called by Thayer Headings from [facility] in regards to obtaining patient's nursing notes from time of arrival to [facility] ED to today. This RN clarified w/ charge RN that the facility would need to obtain notes through Medical Records.

## 2020-03-20 NOTE — ED Notes (Signed)
Notified Dr. Adela Lank of bladder scan amount of 850. Due to recurrent issue of patient retaining urine an order was received to place a foley catheter.

## 2020-03-20 NOTE — Progress Notes (Signed)
CSW conducted face time with patient, Ms. Nathan Schroeder, and Nathan Schroeder from [facility]. Facility needed to reassess patient to determine if he would be appropriate to come back to [facility]. Mr. Manson Passey asked CSW for all notes for patient. CSW stated that they would need to go through medical records. Mr. Manson Passey got upset and told CSW that she was hurting them whether then helping them. CSW stated again that they would need to go through medical records and that she was following protocol. Mr. Manson Passey also asked the nurse for all her records. Mr. Manson Passey was told again that he would need to go through medical records. Call ended and Mr. Manson Passey called the nurse again requesting records.

## 2020-03-20 NOTE — ED Notes (Signed)
Pt seen tugging at his foley cath and slid down on his bed. Pulled pt up and repositioned for comfort. Mittens placed on both hands. Covered pt with warm blankets.

## 2020-03-20 NOTE — BHH Counselor (Signed)
Disposition Counselor received a call from [employer], RN with [facility]. States that their physician will review patient's referral in the am and a decision will be made in regards to acceptance to the facility.

## 2020-03-20 NOTE — Progress Notes (Signed)
CSW received a phone call from [facility] from [facility] about requesting all patient notes. CSW told Ms. Daigle that she would have to speak with patients son and get that requested from medical records.

## 2020-03-20 NOTE — Progress Notes (Signed)
CSW spoke with patients son Loyd Marhefka who asked if CSW could print off and fax over patients notes. CSW informed Mr. Claiborne that he would need to contact medical records and that CSW was unable to print all the notes.

## 2020-03-20 NOTE — BH Assessment (Addendum)
Upon review of chart [facility] reassessed patient to determine if he would be appropriate to come back to [facility]. Meanwhile, patient re-faxed referrals to the following facility based on last psych recommendations ([facility]). Referrals faxed to the following facilities for consideration of bed placement: [facility]    [facility]    [facility]    [facility]    [facility]    [facility]    [facility]   [facility]    [facility]    [facility] (Under review and pending a decision regarding acceptance)    [facility]    [facility]    [facility]    [facility]    [facility]

## 2020-03-21 MED ORDER — CEPHALEXIN 250 MG PO CAPS
500.0000 mg | ORAL_CAPSULE | Freq: Four times a day (QID) | ORAL | Status: DC
  Administered 2020-03-21 – 2020-03-23 (×7): 500 mg via ORAL
  Filled 2020-03-21 (×7): qty 2

## 2020-03-21 NOTE — ED Notes (Signed)
pts med due.  Will hold meds until the pt wakes up

## 2020-03-21 NOTE — ED Notes (Signed)
The pt  Was attempting to spit out his med  I think he finally swallowed the med  With some apple sauce

## 2020-03-21 NOTE — Progress Notes (Signed)
Pt declined at [facility] due to, "not being appropriate for milieu".   Disposition will continue to follow.    Wells Guiles, MSW, LCSW, LCAS Clinical Social Worker II Disposition CSW [phone]

## 2020-03-21 NOTE — ED Notes (Signed)
Tele psych machine to bedside  

## 2020-03-21 NOTE — ED Notes (Signed)
Breakfast Order Placed ?

## 2020-03-21 NOTE — ED Provider Notes (Signed)
Patient with urine culture positive for staph epidermidis and enterococcus faecalis.  Keflex should cover both.  Keflex ordered   Margarita Grizzle, MD 03/21/20 [phone]

## 2020-03-21 NOTE — ED Notes (Signed)
The pt has on mittens to keep him from pulling out his foley cath hes agitasted pulling at his mittens

## 2020-03-21 NOTE — Progress Notes (Signed)
Physical Therapy Treatment Patient Details Name: Nathan Schroeder MRN: [id number] DOB: Apr 22, 1960 Today's Date: 03/21/2020    History of Present Illness 60 y.o. male admitted on 03/10/20 for dementia with behavioral disturbance.  Pt from [facility].  Pt with significant PMH of HTN, anxiety, Alzheimer's disease.    PT Comments    Pt with slow progression towards goals. Limited by agitation and aggression this session.  Pt requiring total A +2 to come to sitting. Once sitting, RN gave meds and pt spitting them back out. Pt also swatting at staff, so further mobility deferred. Once back to supine, pt much calmer and cooperative. Current recommendations appropriate. Will continue to follow acutely.    Follow Up Recommendations  SNF     Equipment Recommendations  Hospital bed    Recommendations for Other Services OT consult     Precautions / Restrictions Precautions Precautions: Fall Restrictions Weight Bearing Restrictions: No    Mobility  Bed Mobility Overal bed mobility: Needs Assistance Bed Mobility: Supine to Sit;Sit to Supine     Supine to sit: +2 for physical assistance;HOB elevated;Total assist Sit to supine: +2 for physical assistance;Total assist   General bed mobility comments: total A +2 to perform bed mobility. max multimodal cues to perform. Pt getting agitated in sitting after taking medications. Spitting medication and applesauce out and started swatting at staff. Further mobility deferred and assisted pt to return to supine. Pt much calmer in supine.    Transfers                    Ambulation/Gait                 Stairs             Wheelchair Mobility    Modified Rankin (Stroke Patients Only)       Balance Overall balance assessment: Needs assistance Sitting-balance support: Feet supported;Bilateral upper extremity supported Sitting balance-Leahy Scale: Poor Sitting balance - Comments: posterior lean and  pushing back                                    Cognition Arousal/Alertness: Awake/alert Behavior During Therapy: Flat affect;Agitated Overall Cognitive Status: Impaired/Different from baseline Area of Impairment: Orientation;Attention;Memory;Following commands;Safety/judgement;Awareness;Problem solving                 Orientation Level: Disoriented to;Person;Place;Time;Situation Current Attention Level: Focused Memory: Decreased recall of precautions;Decreased short-term memory Following Commands: Follows one step commands inconsistently Safety/Judgement: Decreased awareness of safety;Decreased awareness of deficits Awareness: Intellectual Problem Solving: Slow processing;Decreased initiation;Difficulty sequencing;Requires verbal cues;Requires tactile cues General Comments: Pt becoming agitated after sitting at EOB. Pt also resistive to mobility tasks. RN present, giving medication, and pt spitting applesauce and medication. Also swatting at staff.      Exercises      General Comments        Pertinent Vitals/Pain Pain Assessment: Faces Faces Pain Scale: No hurt    Home Living                      Prior Function            PT Goals (current goals can now be found in the care plan section) Acute Rehab PT Goals Patient Stated Goal: unable to state PT Goal Formulation: Patient unable to participate in goal setting Time For Goal Achievement: 03/31/20 Potential to Achieve  Goals: Fair Progress towards PT goals: Progressing toward goals    Frequency    Min 2X/week      PT Plan Current plan remains appropriate    Co-evaluation              AM-PAC PT "6 Clicks" Mobility   Outcome Measure  Help needed turning from your back to your side while in a flat bed without using bedrails?: Total Help needed moving from lying on your back to sitting on the side of a flat bed without using bedrails?: Total Help needed moving to and from a  bed to a chair (including a wheelchair)?: A Lot Help needed standing up from a chair using your arms (e.g., wheelchair or bedside chair)?: Total Help needed to walk in hospital room?: Total Help needed climbing 3-5 steps with a railing? : Total 6 Click Score: 7    End of Session   Activity Tolerance: Patient tolerated treatment well Patient left: in bed;with nursing/sitter in room Psychiatrist) Nurse Communication: Mobility status PT Visit Diagnosis: Muscle weakness (generalized) (M62.81);Difficulty in walking, not elsewhere classified (R26.2)     Time: 3567-0141 PT Time Calculation (min) (ACUTE ONLY): 12 min  Charges:  $Therapeutic Activity: 8-22 mins                     Cindee Salt, DPT  Acute Rehabilitation Services  Pager: [phone] Office: [phone]    Lehman Prom 03/21/2020, 10:21 AM

## 2020-03-21 NOTE — BH Assessment (Addendum)
Re-assessment 03/21/20:   Patient is a 60 y.o. male that presented to [facility] on 03/10/2020.  Patient re-assessed by this Clinician and provider Marciano Sequin, NP), collaboratively via telassessment.    We reviewed chart prior to completing patient's teleassessment today. Per chart review of his initial TTS assessment on 03/11/20: "Pt presents to the ED today with a behavior disturbance.  Pt has a hx of dementia, onset noted when he was 69 yo per son. Pt moved to [facility] about 2 months ago when wife could no longer care for pt. By phone, son [place] [phone]) states pt appears at recent baseline, cognitively. And while aggressive behavior could be triggered when receiving personal care, pt's increased level of aggression prior to ED admission was sudden".   As this Clinician and provider assessed patient today he was observed calm, laying in his hospital bed, dressed in a gown, mittens on hands. He  made periodic movements with hands that didn't appear to be of concern. His nurse for today has also indicated that patient is "having a good day" and seems to have made improvements in behavior from initial presentation. When asked his name he stated, "Yes". He did not respond when asked where he is at this time.Clinician asked if he could remember what brought him to the hospital, and patient provided no response.  Pt appears to have no insight into the reason for his stay. Upon chart review, his responses have remained extremely limited with TTS re-assessments and remains the same today.    Based on Marciano Sequin, NP, observation in today's reassessment patient no longer meets criteria for inpatient psychiatric treatment. He is "Psych Cleared". Marylu Lund recommends that [facility]  Coordinate an appropriate discharge plan and/or placement options that will meet patient's needs.   Patients nurse Morrie Sheldon, RN), Jory Sims Carollee Herter, LCSW), Disposition Social Worker (Sarah, LCSW) and EDP (Dr. Denton Lank)  have all been updated of patient's disposition by Marciano Sequin, NP via secure chat. [facility]/Psych will no longer follow patient at this time.

## 2020-03-21 NOTE — ED Notes (Signed)
PT at bedside.

## 2020-03-21 NOTE — ED Provider Notes (Signed)
Emergency Medicine Observation Re-evaluation Note  Nathan Schroeder is a 60 y.o. male, seen on rounds today.  Pt initially presented to the ED for complaints of No chief complaint on file. Currently, the patient is resting in bed, foley in place and draining.  Physical Exam  BP 138/86 (BP Location: Right Arm)    Pulse 64    Temp 98 F (36.7 C) (Oral)    Resp 16    Wt 124.9 kg    SpO2 99%    BMI 37.34 kg/m  Physical Exam General: awake, does not answer questions Lungs: respirations even and unlabored  Psych: awake, not alert to person/pace   ED Course / MDM  EKG:EKG Interpretation  Date/Time:  Saturday March 10 2020 10:46:44 EST Ventricular Rate:  59 PR Interval:    QRS Duration: 117 QT Interval:  423 QTC Calculation: 419 R Axis:   18 Text Interpretation: Sinus rhythm Nonspecific intraventricular conduction delay Probable anterolateral infarct, old No old tracing to compare Confirmed by Jacalyn Lefevre [phone]) on 03/10/2020 12:18:17 PM  Clinical Course as of 03/21/20 1125  Sat Mar 17, 2020  1155 Bladder scan shows over 400 cc urine. [SJ]    Clinical Course User Index [SJ] Joy, Shawn C, PA-C   I have reviewed the labs performed to date as well as medications administered while in observation.  Recent changes in the last 24 hours include retaining urine, foley placed. Culture pending.  Plan  Current plan is for placement. Patient is not under full IVC at this time.   Jeannie Fend, PA-C 03/21/20 1128    Lorre Nick, MD 03/26/20 [phone]

## 2020-03-21 NOTE — ED Notes (Signed)
Pt still sleeping  He has been asleep for awhile

## 2020-03-22 DIAGNOSIS — R69 Illness, unspecified: Secondary | ICD-10-CM | POA: Diagnosis not present

## 2020-03-22 DIAGNOSIS — Z789 Other specified health status: Secondary | ICD-10-CM | POA: Diagnosis not present

## 2020-03-22 DIAGNOSIS — F0391 Unspecified dementia with behavioral disturbance: Secondary | ICD-10-CM

## 2020-03-22 DIAGNOSIS — Z66 Do not resuscitate: Secondary | ICD-10-CM | POA: Diagnosis not present

## 2020-03-22 DIAGNOSIS — Z515 Encounter for palliative care: Principal | ICD-10-CM

## 2020-03-22 DIAGNOSIS — R4689 Other symptoms and signs involving appearance and behavior: Secondary | ICD-10-CM | POA: Diagnosis not present

## 2020-03-22 DIAGNOSIS — R451 Restlessness and agitation: Secondary | ICD-10-CM | POA: Diagnosis not present

## 2020-03-22 DIAGNOSIS — F0280 Dementia in other diseases classified elsewhere without behavioral disturbance: Secondary | ICD-10-CM

## 2020-03-22 DIAGNOSIS — G309 Alzheimer's disease, unspecified: Secondary | ICD-10-CM

## 2020-03-22 DIAGNOSIS — Z7189 Other specified counseling: Secondary | ICD-10-CM | POA: Diagnosis not present

## 2020-03-22 LAB — COMPREHENSIVE METABOLIC PANEL
ALT: 54 U/L — ABNORMAL HIGH (ref 0–44)
AST: 40 U/L (ref 15–41)
Albumin: 3.4 g/dL — ABNORMAL LOW (ref 3.5–5.0)
Alkaline Phosphatase: 62 U/L (ref 38–126)
Anion gap: 11 (ref 5–15)
BUN: 24 mg/dL — ABNORMAL HIGH (ref 8–23)
CO2: 22 mmol/L (ref 22–32)
Calcium: 9.1 mg/dL (ref 8.9–10.3)
Chloride: 100 mmol/L (ref 98–111)
Creatinine, Ser: 1.06 mg/dL (ref 0.61–1.24)
GFR, Estimated: >60 mL/min (ref >60)
Glucose, Bld: 109 mg/dL — ABNORMAL HIGH (ref 70–99)
Potassium: 4.1 mmol/L (ref 3.5–5.1)
Sodium: 133 mmol/L — ABNORMAL LOW (ref 135–145)
Total Bilirubin: 0.8 mg/dL (ref 0.3–1.2)
Total Protein: 7.8 g/dL (ref 6.5–8.1)

## 2020-03-22 LAB — CBC
HCT: 44 % (ref 39.0–52.0)
Hemoglobin: 15.3 g/dL (ref 13.0–17.0)
MCH: 33.8 pg (ref 26.0–34.0)
MCHC: 34.8 g/dL (ref 30.0–36.0)
MCV: 97.1 fL (ref 80.0–100.0)
Platelets: 196 10*3/uL (ref 150–400)
RBC: 4.53 MIL/uL (ref 4.22–5.81)
RDW: 12.1 % (ref 11.5–15.5)
WBC: 6.1 10*3/uL (ref 4.0–10.5)
nRBC: 0 % (ref 0.0–0.2)

## 2020-03-22 LAB — CBG MONITORING, ED: Glucose-Capillary: 117 mg/dL — ABNORMAL HIGH (ref 70–99)

## 2020-03-22 LAB — VALPROIC ACID LEVEL: Valproic Acid Lvl: 42 ug/mL — ABNORMAL LOW (ref 50.0–100.0)

## 2020-03-22 NOTE — ED Notes (Signed)
This RN, and three sitters helped change pt's linens and do peri care. Pt had large BM

## 2020-03-22 NOTE — NC FL2 (Signed)
[place] MEDICAID FL2 LEVEL OF CARE SCREENING TOOL     IDENTIFICATION  Patient Name: Nathan Schroeder Birthdate: 1960-07-14 Sex: male Admission Date (Current Location): 03/10/2020  [facility] and [place] Number:  Producer, television/film/video and Address:  The . [facility], [address] [place], [place] [address]      Provider Number: [id number]  Attending Physician Name and Address:  Default, Provider, MD  Relative Name and Phone Number:  Yeudiel, Mateo   [phone]    Current Level of Care: Hospital Recommended Level of Care: Skilled Nursing Facility Prior Approval Number:    Date Approved/Denied:   PASRR Number: [id number]  Discharge Plan: SNF    Current Diagnoses: Patient Active Problem List   Diagnosis Date Noted  . Alzheimer's disease (HCC)   . Aggressive behavior   . Behavioral and psychological symptoms of dementia (HCC)   . Ventral hernia without obstruction or gangrene 04/13/2019  . Tinea pedis of both feet 04/13/2019  . Iron excess 04/13/2019  . Hyperlipidemia LDL goal <100 04/13/2019  . Localized swelling of both lower legs 04/13/2019  . BMI 40.0-44.9, adult (HCC) 04/13/2019  . Frequent urination 04/13/2019  . Mild cognitive impairment with memory loss 02/03/2013  . Hypertension 07/19/2012  . Memory loss 07/19/2012    Orientation RESPIRATION BLADDER Height & Weight     Self  Normal Incontinent Weight: 275 lb 5.7 oz (124.9 kg) Height:     BEHAVIORAL SYMPTOMS/MOOD NEUROLOGICAL BOWEL NUTRITION STATUS      Incontinent Diet (Regular)  AMBULATORY STATUS COMMUNICATION OF NEEDS Skin   Limited Assist Verbally Normal                       Personal Care Assistance Level of Assistance  Bathing,Feeding,Dressing Bathing Assistance: Limited assistance Feeding assistance: Limited assistance Dressing Assistance: Maximum assistance     Functional Limitations Info  Speech,Hearing,Sight Sight Info: Adequate Hearing Info:  Adequate Speech Info: Adequate    SPECIAL CARE FACTORS FREQUENCY  PT (By licensed PT),OT (By licensed OT)     PT Frequency: 5 x weekly OT Frequency: 5x weekly            Contractures Contractures Info: Not present    Additional Factors Info  Code Status Code Status Info: Full             Current Medications (03/22/2020):  This is the current hospital active medication list Current Facility-Administered Medications  Medication Dose Route Frequency Provider Last Rate Last Admin  . ALPRAZolam Prudy Feeler) tablet 0.5 mg  0.5 mg Oral BID Jacalyn Lefevre, MD   0.5 mg at 03/22/20 0914  . aspirin EC tablet 81 mg  81 mg Oral Daily Jacalyn Lefevre, MD   81 mg at 03/22/20 2527  . buPROPion (WELLBUTRIN XL) 24 hr tablet 300 mg  300 mg Oral Daily Jacalyn Lefevre, MD   300 mg at 03/22/20 0917  . busPIRone (BUSPAR) tablet 15 mg  15 mg Oral TID PRN Jacalyn Lefevre, MD   15 mg at 03/20/20 2337  . cephALEXin (KEFLEX) capsule 500 mg  500 mg Oral Q6H Margarita Grizzle, MD   500 mg at 03/22/20 1754  . divalproex (DEPAKOTE) DR tablet 250 mg  250 mg Oral TID Jacalyn Lefevre, MD   250 mg at 03/22/20 1548  . donepezil (ARICEPT) tablet 10 mg  10 mg Oral QHS Jacalyn Lefevre, MD   10 mg at 03/21/20 2203  . lidocaine (XYLOCAINE) 2 % jelly 1 application  1 application Urethral Once Stoioff, Scott C, MD      . memantine (NAMENDA) tablet 10 mg  10 mg Oral BID Jacalyn Lefevre, MD   10 mg at 03/22/20 1308  . QUEtiapine (SEROQUEL) tablet 100 mg  100 mg Oral BID Jacalyn Lefevre, MD   100 mg at 03/22/20 0915  . sertraline (ZOLOFT) tablet 100 mg  100 mg Oral Daily Jacalyn Lefevre, MD   100 mg at 03/22/20 6578   Current Outpatient Medications  Medication Sig Dispense Refill  . ALPRAZolam (XANAX) 1 MG tablet Take 1 mg by mouth daily as needed for sleep.    [name] ALPRAZOLAM XR 1 MG 24 hr tablet Take 1 mg by mouth daily.    [name] aspirin EC 81 MG tablet Take 81 mg by mouth daily.    [name] buPROPion (WELLBUTRIN XL) 300 MG 24 hr tablet  Take 300 mg by mouth daily.    . busPIRone (BUSPAR) 15 MG tablet Take 15 mg by mouth 3 (three) times daily as needed (anxiety).    [name] divalproex (DEPAKOTE) 250 MG DR tablet Take 250 mg by mouth 3 (three) times daily.    [name] donepezil (ARICEPT) 10 MG tablet Take 10 mg by mouth at bedtime.  5  . furosemide (LASIX) 20 MG tablet Take 20 mg by mouth daily.    [name] lisinopril (ZESTRIL) 20 MG tablet TAKE 1 TABLET(20 MG) BY MOUTH DAILY (Patient taking differently: Take 20 mg by mouth daily.) 90 tablet 2  . memantine (NAMENDA) 10 MG tablet Take 10 mg by mouth 2 (two) times daily.    . Multiple Vitamins-Minerals (SENIOR MULTIVITAMIN PLUS PO) Take 1 tablet by mouth daily.    . naproxen sodium (ALEVE) 220 MG tablet Take 220 mg by mouth daily as needed (pain).    . QUEtiapine (SEROQUEL) 100 MG tablet Take 100 mg by mouth 2 (two) times daily.    . sertraline (ZOLOFT) 100 MG tablet Take 1 tablet by mouth daily.    . Vitamin D, Ergocalciferol, (DRISDOL) 1.25 MG (50000 UNIT) CAPS capsule Take 50,000 Units by mouth once a week.    . urea (CARMOL) 20 % cream Apply twice a day for 2 weeks only (Patient not taking: Reported on 03/10/2020) 85 g 1     Discharge Medications: Please see discharge summary for a list of discharge medications.  Relevant Imaging Results:  Relevant Lab Results:   Additional Information SSN# [id number]  Shella Spearing, LCSW

## 2020-03-22 NOTE — ED Provider Notes (Signed)
Discussed with wife Nathan Schroeder [phone].  She states that patient has been ambulatory and in fact has been up and agitated much of the time since his placement at the memory care unit.  Here in the emergency department the patient has been on Seroquel and Xanax.  Is also on Depakote.  He was on very similar doses of this ordered prior to admission.  However is unclear exactly what he was taking.  Additionally whenever these medicines began to lighten, he becomes aggressive and agitated.  It is likely that the sedating effects of these medications are contributing to his decreased mobility and alertness.  Have ordered recheck of the Depakote level.  I have attempted to get in touch with his son Javien received no answer at the contact number of [phone]. Discussed palliative care with wife. Discussed with Dr.Golding and Erin on for palliative care Consult placed   Margarita Grizzle, MD 03/22/20 1255

## 2020-03-22 NOTE — ED Notes (Signed)
Attempted to turn pt throughout my shift and have him shift weight to prevent pressure ulcers. Pt continues to move him self back on his back.

## 2020-03-22 NOTE — ED Provider Notes (Signed)
Emergency Medicine Observation Re-evaluation Note  Nathan Schroeder is a 60 y.o. male, seen on rounds today.  Pt initially presented to the ED for complaints of No chief complaint on file. Currently, the patient is restless in bed, pulling at his mits.  Physical Exam  BP (!) 98/56   Pulse 72   Temp 97.7 F (36.5 C) (Axillary)   Resp 16   Wt 124.9 kg   SpO2 96%   BMI 37.34 kg/m  Physical Exam General: Restless Cardiac: Well-perfused Lungs: Nonlabored Psych: Restless  ED Course / MDM  EKG:EKG Interpretation  Date/Time:  Saturday March 10 2020 10:46:44 EST Ventricular Rate:  59 PR Interval:    QRS Duration: 117 QT Interval:  423 QTC Calculation: 419 R Axis:   18 Text Interpretation: Sinus rhythm Nonspecific intraventricular conduction delay Probable anterolateral infarct, old No old tracing to compare Confirmed by Jacalyn Lefevre [phone]) on 03/10/2020 12:18:17 PM  Clinical Course as of 03/22/20 0848  Sat Mar 17, 2020  1155 Bladder scan shows over 400 cc urine. [SJ]    Clinical Course User Index [SJ] Joy, Shawn C, PA-C   I have reviewed the labs performed to date as well as medications administered while in observation.  Recent changes in the last 24 hours include Foley placement, antibiotics for urine, psychiatric clearance.  Plan  Current plan is for social work assistance for placement. Patient is not under full IVC at this time.   Terrilee Files, MD 03/23/20 [phone]

## 2020-03-22 NOTE — ED Provider Notes (Signed)
  Physical Exam  BP (!) 95/57 (BP Location: Left Arm)   Pulse (!) 59   Temp 97.9 F (36.6 C) (Axillary)   Resp 16   Wt 124.9 kg   SpO2 92%   BMI 37.34 kg/m   Physical Exam  ED Course/Procedures   Clinical Course as of 03/22/20 1656  Sat Mar 17, 2020  1155 Bladder scan shows over 400 cc urine. [SJ]    Clinical Course User Index [SJ] Joy, Shawn C, PA-C    Procedures  MDM  Patient was hypotensive earlier today.  Patient does have a UTI and is on Keflex.  Patient had a repeat CBC and CMP today that was unchanged from previous.  [name] blood pressure went up to 95/57.  Patient was on diuretics and that is discontinued in the is likely causing the hypotension.  No change in plan for now and patient will still need placement.  Patient does not need medical admission currently       Charlynne Pander, MD 03/22/20 1659

## 2020-03-22 NOTE — Progress Notes (Signed)
Occupational Therapy Evaluation Patient Details Name: Nathan Schroeder MRN: [id number] DOB: 06-19-1960 Today's Date: 03/22/2020    History of Present Illness 60 y.o. male admitted on 03/10/20 for dementia with behavioral disturbance.  Pt from [facility].  Pt with significant PMH of HTN, anxiety, Alzheimer's disease.   Clinical Impression   Per chart, pt was at [facility] (recently moved there 2 months ago); unsure of PLOF and assistance requirement. Pt drowsy upon arrival and only waking with repositioning in bed. With HOB elevated, pt initially swinging BUEs and then attempting to pull off mitts. Pt calming with soft voice and simple encouragement. Pt participating in drinking task at bed level with Min A for support at elbow to bring cup with straw to mouth. Pt requiring Max-Total A for ADLs due to cognition. Pt would benefit from further acute OT to facilitate safe dc. Recommend dc to SNF for further OT to optimize safety, independence with ADLs, and return to PLOF.     Follow Up Recommendations  SNF    Equipment Recommendations  Other (comment) (Defer to next venue)    Recommendations for Other Services PT consult     Precautions / Restrictions Precautions Precautions: Fall Restrictions Weight Bearing Restrictions: No      Mobility Bed Mobility Overal bed mobility: Needs Assistance             General bed mobility comments: Repositioned in bed. Quickly swinging arms and becoming agitated with change in posture at bed level. Defered EOB at this time as pt slightly lethargic and becoming agitated with movement.    Transfers                      Balance                                           ADL either performed or assessed with clinical judgement   ADL Overall ADL's : Needs assistance/impaired Eating/Feeding: Minimal assistance;Bed level Eating/Feeding Details (indicate cue type and  reason): Min A for support at elbow to bring cup with straw to mouth.   Grooming Details (indicate cue type and reason): cueing for washing face but pt not following commands                               General ADL Comments: Total A for bathing, dressing, and toileting. Opening eyes to name and agreeable to drink water. Falling asleep during conversation.     Vision         Perception     Praxis      Pertinent Vitals/Pain Pain Assessment: Faces Faces Pain Scale: No hurt Pain Intervention(s): Monitored during session     Hand Dominance Right   Extremity/Trunk Assessment Upper Extremity Assessment Upper Extremity Assessment: Overall WFL for tasks assessed   Lower Extremity Assessment Lower Extremity Assessment: Defer to PT evaluation       Communication Communication Communication: Other (comment) (Soft spoken and one word responces)   Cognition Arousal/Alertness: Lethargic Behavior During Therapy: Flat affect (Quickly fluctuating to agitation) Overall Cognitive Status: Impaired/Different from baseline Area of Impairment: Orientation;Attention;Memory;Following commands;Safety/judgement;Awareness;Problem solving                 Orientation Level: Disoriented to;Place;Time;Situation ("im steve") Current Attention Level: Focused Memory: Decreased  recall of precautions;Decreased short-term memory Following Commands: Follows one step commands inconsistently Safety/Judgement: Decreased awareness of safety;Decreased awareness of deficits Awareness: Intellectual Problem Solving: Slow processing;Decreased initiation;Difficulty sequencing;Requires verbal cues;Requires tactile cues General Comments: Pt drowsy and falling in and out of sleep during session. Waking when repositioning with bed and initially swinging out. Pt benefiting from calm, simple cues.   General Comments       Exercises     Shoulder Instructions      Home Living Family/patient  expects to be discharged to:: Assisted living                             Home Equipment: None   Additional Comments: [facility]  per chart review      Prior Functioning/Environment Level of Independence: Needs assistance  Gait / Transfers Assistance Needed: Performing functional mobility per chart review. ADL's / Homemaking Assistance Needed: Unsure as pt not able to provide information            OT Problem List: Decreased strength;Decreased range of motion;Decreased activity tolerance;Decreased safety awareness;Decreased knowledge of use of DME or AE;Decreased knowledge of precautions;Decreased cognition      OT Treatment/Interventions: Self-care/ADL training;Therapeutic exercise;DME and/or AE instruction;Energy conservation;Patient/family education;Therapeutic activities    OT Goals(Current goals can be found in the care plan section) Acute Rehab OT Goals Patient Stated Goal: unable to state OT Goal Formulation: Patient unable to participate in goal setting Time For Goal Achievement: 04/05/20 Potential to Achieve Goals: Good  OT Frequency: Min 2X/week   Barriers to D/C:            Co-evaluation              AM-PAC OT "6 Clicks" Daily Activity     Outcome Measure Help from another person eating meals?: A Little Help from another person taking care of personal grooming?: A Lot Help from another person toileting, which includes using toliet, bedpan, or urinal?: Total Help from another person bathing (including washing, rinsing, drying)?: Total Help from another person to put on and taking off regular upper body clothing?: Total Help from another person to put on and taking off regular lower body clothing?: Total 6 Click Score: 9   End of Session Nurse Communication: Mobility status  Activity Tolerance: Patient tolerated treatment well;Patient limited by lethargy Patient left: in bed;with call bell/phone within  reach;with nursing/sitter in room;with restraints reapplied  OT Visit Diagnosis: Unsteadiness on feet (R26.81);Other abnormalities of gait and mobility (R26.89);Muscle weakness (generalized) (M62.81)                Time: 6433-2951 OT Time Calculation (min): 12 min Charges:  OT General Charges $OT Visit: 1 Visit OT Evaluation $OT Eval Moderate Complexity: 1 Mod  Charis Capehart MSOT, OTR/L Acute Rehab Pager: [phone] Office: [phone]  Theodoro Grist Capehart 03/22/2020, 11:13 AM

## 2020-03-22 NOTE — ED Notes (Addendum)
This RN spoke with pt's son, Nathan Schroeder (cell number [phone]), who is trying to get a better idea of what the plan is for his father. Nathan Schroeder is requesting to meet with EDP and any other team members who collaborate on pt's care to make a plan and have everyone on the same page. Nathan Schroeder states he will come to ED tomorrow around 11am and would like to have a meeting. Will pass this information on to next shift as well.

## 2020-03-22 NOTE — ED Notes (Signed)
Pt ate most of his dinner with coaxing from RN and NT. Pt tolerated turning to his side and having feet propped up.

## 2020-03-22 NOTE — ED Notes (Signed)
Breakfast order placed ?

## 2020-03-22 NOTE — ED Notes (Signed)
The pt is not very sleepy

## 2020-03-22 NOTE — Progress Notes (Signed)
CSW met with Pt at bedside to begin SNF process. Pt oriented to self only. Pt was calm and quiet. Pt offered little information but did state that he would be willing to go to SNF for physical rehabilitation.

## 2020-03-22 NOTE — ED Notes (Addendum)
Notified MD that pt's BP 89/60. Pt resting comfortably in bed.

## 2020-03-22 NOTE — Progress Notes (Signed)
Brief Palliative Medicine Progress Note:  Spoke with son/Cray Dugar - family meeting scheduled for tomorrow 2/25 at 12pm.   Family is requesting presence of EDP if able to attend - they would like medical updates.   Thank you for allowing PMT to assist in the care of this patient.  Nathan Schroeder M. Katrinka Blazing [facility] Palliative Medicine Team Team Phone: [phone] NO CHARGE
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
## 2020-03-22 NOTE — Progress Notes (Signed)
CSW sent referrals to area SNFs including [facility]

## 2020-03-22 NOTE — ED Notes (Signed)
Caseworker at bedside to do assessment for placement.

## 2020-03-23 DIAGNOSIS — G309 Alzheimer's disease, unspecified: Secondary | ICD-10-CM | POA: Diagnosis not present

## 2020-03-23 DIAGNOSIS — Z789 Other specified health status: Secondary | ICD-10-CM | POA: Diagnosis not present

## 2020-03-23 DIAGNOSIS — R338 Other retention of urine: Secondary | ICD-10-CM | POA: Diagnosis present

## 2020-03-23 DIAGNOSIS — E785 Hyperlipidemia, unspecified: Secondary | ICD-10-CM | POA: Diagnosis present

## 2020-03-23 DIAGNOSIS — Z6837 Body mass index (BMI) 37.0-37.9, adult: Secondary | ICD-10-CM | POA: Diagnosis not present

## 2020-03-23 DIAGNOSIS — R8271 Bacteriuria: Secondary | ICD-10-CM | POA: Diagnosis not present

## 2020-03-23 DIAGNOSIS — F419 Anxiety disorder, unspecified: Secondary | ICD-10-CM | POA: Diagnosis present

## 2020-03-23 DIAGNOSIS — B957 Other staphylococcus as the cause of diseases classified elsewhere: Secondary | ICD-10-CM | POA: Diagnosis present

## 2020-03-23 DIAGNOSIS — Z20822 Contact with and (suspected) exposure to covid-19: Secondary | ICD-10-CM | POA: Diagnosis present

## 2020-03-23 DIAGNOSIS — Z515 Encounter for palliative care: Secondary | ICD-10-CM

## 2020-03-23 DIAGNOSIS — E669 Obesity, unspecified: Secondary | ICD-10-CM | POA: Diagnosis present

## 2020-03-23 DIAGNOSIS — Z7189 Other specified counseling: Secondary | ICD-10-CM | POA: Diagnosis not present

## 2020-03-23 DIAGNOSIS — B952 Enterococcus as the cause of diseases classified elsewhere: Secondary | ICD-10-CM | POA: Diagnosis present

## 2020-03-23 DIAGNOSIS — F0391 Unspecified dementia with behavioral disturbance: Secondary | ICD-10-CM | POA: Diagnosis not present

## 2020-03-23 DIAGNOSIS — G300 Alzheimer's disease with early onset: Secondary | ICD-10-CM | POA: Diagnosis not present

## 2020-03-23 DIAGNOSIS — Z7982 Long term (current) use of aspirin: Secondary | ICD-10-CM | POA: Diagnosis not present

## 2020-03-23 DIAGNOSIS — R69 Illness, unspecified: Secondary | ICD-10-CM | POA: Diagnosis not present

## 2020-03-23 DIAGNOSIS — F0281 Dementia in other diseases classified elsewhere with behavioral disturbance: Secondary | ICD-10-CM | POA: Diagnosis present

## 2020-03-23 DIAGNOSIS — N3289 Other specified disorders of bladder: Secondary | ICD-10-CM | POA: Diagnosis present

## 2020-03-23 DIAGNOSIS — F0280 Dementia in other diseases classified elsewhere without behavioral disturbance: Secondary | ICD-10-CM | POA: Diagnosis not present

## 2020-03-23 DIAGNOSIS — R32 Unspecified urinary incontinence: Secondary | ICD-10-CM | POA: Diagnosis not present

## 2020-03-23 DIAGNOSIS — Z743 Need for continuous supervision: Secondary | ICD-10-CM | POA: Diagnosis not present

## 2020-03-23 DIAGNOSIS — R456 Violent behavior: Secondary | ICD-10-CM | POA: Diagnosis present

## 2020-03-23 DIAGNOSIS — R4689 Other symptoms and signs involving appearance and behavior: Secondary | ICD-10-CM | POA: Diagnosis not present

## 2020-03-23 DIAGNOSIS — F918 Other conduct disorders: Secondary | ICD-10-CM | POA: Diagnosis not present

## 2020-03-23 DIAGNOSIS — Z66 Do not resuscitate: Secondary | ICD-10-CM

## 2020-03-23 DIAGNOSIS — R451 Restlessness and agitation: Secondary | ICD-10-CM | POA: Diagnosis present

## 2020-03-23 DIAGNOSIS — R4182 Altered mental status, unspecified: Secondary | ICD-10-CM | POA: Diagnosis not present

## 2020-03-23 DIAGNOSIS — Z79899 Other long term (current) drug therapy: Secondary | ICD-10-CM | POA: Diagnosis not present

## 2020-03-23 DIAGNOSIS — E440 Moderate protein-calorie malnutrition: Secondary | ICD-10-CM | POA: Diagnosis not present

## 2020-03-23 DIAGNOSIS — R159 Full incontinence of feces: Secondary | ICD-10-CM | POA: Diagnosis not present

## 2020-03-23 DIAGNOSIS — R279 Unspecified lack of coordination: Secondary | ICD-10-CM | POA: Diagnosis not present

## 2020-03-23 DIAGNOSIS — Z781 Physical restraint status: Secondary | ICD-10-CM | POA: Diagnosis not present

## 2020-03-23 DIAGNOSIS — I10 Essential (primary) hypertension: Secondary | ICD-10-CM | POA: Diagnosis present

## 2020-03-23 DIAGNOSIS — R339 Retention of urine, unspecified: Secondary | ICD-10-CM | POA: Diagnosis not present

## 2020-03-23 DIAGNOSIS — I959 Hypotension, unspecified: Secondary | ICD-10-CM | POA: Diagnosis present

## 2020-03-23 DIAGNOSIS — S3729XA Other injury of bladder, initial encounter: Secondary | ICD-10-CM | POA: Diagnosis not present

## 2020-03-23 DIAGNOSIS — R531 Weakness: Secondary | ICD-10-CM | POA: Diagnosis not present

## 2020-03-23 DIAGNOSIS — R278 Other lack of coordination: Secondary | ICD-10-CM | POA: Diagnosis not present

## 2020-03-23 MED ORDER — ONDANSETRON HCL 4 MG/2ML IJ SOLN: 4.0000 mg | Freq: Four times a day (QID) | INTRAMUSCULAR | Status: DC | PRN

## 2020-03-23 MED ORDER — MORPHINE SULFATE (PF) 2 MG/ML IV SOLN: 2.0000 mg | INTRAVENOUS | Status: DC | PRN

## 2020-03-23 MED ORDER — BIOTENE DRY MOUTH MT LIQD: 15.0000 mL | OROMUCOSAL | Status: DC | PRN

## 2020-03-23 MED ORDER — HALOPERIDOL LACTATE 2 MG/ML PO CONC
0.5000 mg | ORAL | Status: DC | PRN
  Filled 2020-03-23: qty 0.3

## 2020-03-23 MED ORDER — GLYCOPYRROLATE 0.2 MG/ML IJ SOLN: 0.2000 mg | INTRAMUSCULAR | Status: DC | PRN

## 2020-03-23 MED ORDER — ACETAMINOPHEN 325 MG PO TABS
650.0000 mg | ORAL_TABLET | Freq: Four times a day (QID) | ORAL | Status: DC | PRN
  Filled 2020-03-23 (×2): qty 2

## 2020-03-23 MED ORDER — GLYCOPYRROLATE 1 MG PO TABS
1.0000 mg | ORAL_TABLET | ORAL | Status: DC | PRN
  Filled 2020-03-23: qty 1

## 2020-03-23 MED ORDER — NITROFURANTOIN MONOHYD MACRO 100 MG PO CAPS: 100.0000 mg | ORAL_CAPSULE | Freq: Every day | ORAL | Status: DC

## 2020-03-23 MED ORDER — NITROFURANTOIN MONOHYD MACRO 100 MG PO CAPS: 100.0000 mg | ORAL_CAPSULE | Freq: Two times a day (BID) | ORAL | Status: DC

## 2020-03-23 MED ORDER — NITROFURANTOIN MONOHYD MACRO 100 MG PO CAPS
100.0000 mg | ORAL_CAPSULE | Freq: Two times a day (BID) | ORAL | Status: DC
  Administered 2020-03-23: 100 mg via ORAL
  Filled 2020-03-23 (×2): qty 1

## 2020-03-23 MED ORDER — HALOPERIDOL 0.5 MG PO TABS
0.5000 mg | ORAL_TABLET | ORAL | Status: DC | PRN
  Administered 2020-03-23 – 2020-03-24 (×3): 0.5 mg via ORAL
  Filled 2020-03-23 (×6): qty 1

## 2020-03-23 MED ORDER — LORAZEPAM 2 MG/ML PO CONC
1.0000 mg | ORAL | Status: DC | PRN
  Administered 2020-03-26: 1 mg via SUBLINGUAL
  Filled 2020-03-23 (×2): qty 1

## 2020-03-23 MED ORDER — POLYVINYL ALCOHOL 1.4 % OP SOLN
1.0000 [drp] | Freq: Four times a day (QID) | OPHTHALMIC | Status: DC | PRN
  Filled 2020-03-23: qty 15

## 2020-03-23 MED ORDER — ACETAMINOPHEN 650 MG RE SUPP: 650.0000 mg | Freq: Four times a day (QID) | RECTAL | Status: DC | PRN

## 2020-03-23 MED ORDER — ONDANSETRON 4 MG PO TBDP: 4.0000 mg | ORAL_TABLET | Freq: Four times a day (QID) | ORAL | Status: DC | PRN

## 2020-03-23 MED ORDER — NITROFURANTOIN MONOHYD MACRO 100 MG PO CAPS
100.0000 mg | ORAL_CAPSULE | Freq: Two times a day (BID) | ORAL | Status: DC
  Administered 2020-03-23 – 2020-03-24 (×2): 100 mg via ORAL
  Filled 2020-03-23 (×3): qty 1

## 2020-03-23 MED ORDER — LORAZEPAM 1 MG PO TABS
1.0000 mg | ORAL_TABLET | ORAL | Status: DC | PRN
  Administered 2020-03-24 – 2020-04-02 (×17): 1 mg via ORAL
  Filled 2020-03-23 (×18): qty 1

## 2020-03-23 NOTE — ED Provider Notes (Addendum)
Emergency Medicine Observation Re-evaluation Note  Nathan Schroeder is a 60 y.o. male, seen on rounds today.  Pt initially presented to the ED for complaints of aggressive behavior.  Patient started on treatment for UTI per urine cx results on 2/23.  Continues on keflex for now.  Currently, the patient is resting in bed.  Physical Exam  BP (!) 106/58 (BP Location: Right Arm)    Pulse 63    Temp 98 F (36.7 C) (Axillary)    Resp 16    Wt 124.9 kg    SpO2 100%    BMI 37.34 kg/m  Physical Exam General: No apparent distress Cardiac: Regular rate Lungs: No respiratory distress Psych: No significant changes.  ED Course / MDM  EKG:EKG Interpretation  Date/Time:  Saturday March 10 2020 10:46:44 EST Ventricular Rate:  59 PR Interval:    QRS Duration: 117 QT Interval:  423 QTC Calculation: 419 R Axis:   18 Text Interpretation: Sinus rhythm Nonspecific intraventricular conduction delay Probable anterolateral infarct, old No old tracing to compare Confirmed by Jacalyn Lefevre [phone]) on 03/10/2020 12:18:17 PM   I have reviewed the labs performed to date as well as medications administered while in observation.   Plan  Current plan is for SNF placement with ToC team.  Patient has been psych cleared. Patient is not under full IVC at this time.   Terald Sleeper, MD 03/23/20 5053    Terald Sleeper, MD 03/23/20 [phone]

## 2020-03-23 NOTE — ED Provider Notes (Signed)
I spoke to our pharmacist who recommended switching from Keflex to [facility] for better coverage of both urine culture organisms.  Changed to macrobid 100 mg BID for 7 days.   Terald Sleeper, MD 03/23/20 [phone]

## 2020-03-23 NOTE — Progress Notes (Signed)
Physical Therapy Treatment Patient Details Name: Nathan Schroeder MRN: [id number] DOB: November 17, 1960 Today's Date: 03/23/2020    History of Present Illness 60 y.o. male admitted on 03/10/20 for dementia with behavioral disturbance.  Pt from [facility].  Pt with significant PMH of HTN, anxiety, Alzheimer's disease.    PT Comments    Pt with slow progression towards goals. Positioned in an upright position so RN could give meds, but pt spitting them out again as in last session. Did not want to further agitate pt, so session focused on HEP. Pt keeping eyes closed but was able to perform supine HEP with manual assist and verbal cues.  Current recommendations appropriate, however, did note family was having a goals of care discussion. Will continue to follow and update recommendations based on continued discussion.    Follow Up Recommendations  SNF     Equipment Recommendations  Hospital bed;Wheelchair cushion (measurements PT);Wheelchair (measurements PT)    Recommendations for Other Services OT consult     Precautions / Restrictions Precautions Precautions: Fall Restrictions Weight Bearing Restrictions: No    Mobility  Bed Mobility Overal bed mobility: Needs Assistance             General bed mobility comments: Pt with increased lethargy. When he did wake in an upright position in bed, pt spitting pills again, so did not attempt OOB mobility to avoid any agitation or aggressiveness.    Transfers                    Ambulation/Gait                 Stairs             Wheelchair Mobility    Modified Rankin (Stroke Patients Only)       Balance                                            Cognition Arousal/Alertness: Lethargic Behavior During Therapy: Flat affect (Quickly fluctuating to agitation) Overall Cognitive Status: Impaired/Different from baseline Area of Impairment:  Orientation;Attention;Memory;Following commands;Safety/judgement;Awareness;Problem solving                 Orientation Level: Disoriented to;Place;Time;Situation ("im steve") Current Attention Level: Focused Memory: Decreased recall of precautions;Decreased short-term memory Following Commands: Follows one step commands inconsistently Safety/Judgement: Decreased awareness of safety;Decreased awareness of deficits Awareness: Intellectual Problem Solving: Slow processing;Decreased initiation;Difficulty sequencing;Requires verbal cues;Requires tactile cues General Comments: Pt drowsy and falling in and out of sleep during session. Waking with positioning and when taking meds that RN gave him. Did spit pills out again this session.      Exercises General Exercises - Upper Extremity Shoulder Flexion: AAROM;Both;10 reps;Supine General Exercises - Lower Extremity Heel Slides: AAROM;Both;10 reps;Supine Hip ABduction/ADduction: AROM;Both;10 reps;Supine Straight Leg Raises: AAROM;Both;5 reps;Supine    General Comments General comments (skin integrity, edema, etc.): Pt keeping eyes closed during HEP, but was able to assist. Required manual assist along with verbal cues for technique.      Pertinent Vitals/Pain Pain Assessment: Faces Faces Pain Scale: No hurt    Home Living Family/patient expects to be discharged to:: Assisted living             Home Equipment: None Additional Comments: [facility]isted Living  per chart review    Prior Function Level of Independence:  Needs assistance  Gait / Transfers Assistance Needed: Performing functional mobility per chart review. ADL's / Homemaking Assistance Needed: Unsure as pt not able to provide information     PT Goals (current goals can now be found in the care plan section) Acute Rehab PT Goals Patient Stated Goal: unable to state PT Goal Formulation: Patient unable to participate in goal setting Time For Goal  Achievement: 03/31/20 Potential to Achieve Goals: Fair Progress towards PT goals: Not progressing toward goals - comment (slowly)    Frequency    Min 2X/week      PT Plan Equipment recommendations need to be updated    Co-evaluation              AM-PAC PT "6 Clicks" Mobility   Outcome Measure  Help needed turning from your back to your side while in a flat bed without using bedrails?: Total Help needed moving from lying on your back to sitting on the side of a flat bed without using bedrails?: Total Help needed moving to and from a bed to a chair (including a wheelchair)?: Total Help needed standing up from a chair using your arms (e.g., wheelchair or bedside chair)?: Total Help needed to walk in hospital room?: Total Help needed climbing 3-5 steps with a railing? : Total 6 Click Score: 6    End of Session   Activity Tolerance: Patient limited by lethargy Patient left: in bed;with nursing/sitter in room;with call bell/phone within reach Nurse Communication: Mobility status PT Visit Diagnosis: Muscle weakness (generalized) (M62.81);Difficulty in walking, not elsewhere classified (R26.2)     Time: 1206-1223 PT Time Calculation (min) (ACUTE ONLY): 17 min  Charges:  $Therapeutic Exercise: 8-22 mins                     Cindee Salt, DPT  Acute Rehabilitation Services  Pager: [phone] Office: [phone]    Lehman Prom 03/23/2020, 1:59 PM

## 2020-03-23 NOTE — ED Provider Notes (Addendum)
Participated in family meeting today from noon to 1 PM Participants included patient's wife Nathan Schroeder, son Nathan Schroeder, palliative care Nathan Cooter, NP, Nathan Forth, RN transitions of care, and patient's primary care physician Nathan Schroeder. Please reference palliative care's full note Nathan Schroeder is a 60 year old man who has a history of dementia and had been in a dementia care unit where he had been physically active.  A symptom of his dementia, agitation and some aggressive behavior have prompted his transfer to [facility] as they were unable to [place] handle the symptoms at the unit subsequently, Nathan Schroeder has undergone decline this likely multifactorial including medications, although these have been unchanged significantly from prior but do require sedating medications, a urinary tract infection that had a culture positive although this was positive for staph epidermidis and Enterococcus.  He has not had systemic signs including no fever or white blood cell count.  He has likely had some deconditioning since being in the emergency department for now 13 days and being mostly confined to a bed.  Additionally with the above factors, this is likely culmination of his dementia. The meeting was to elicit family understanding of situation and wishes.  They did represent that Nathan Schroeder, has a DNR, and were he able to make decisions for himself would be unlikely to wish any aggressive care at this time.  Palliative care will address writing and DNR in his hospital orders. It is likely that he will be a candidate for hospice care, however we are awaiting final family determination regarding this.   Nathan Grizzle, MD 03/23/20 1401  2:02 PM  Discussed with Nathan Cooter, NP, family is aligned with comfort care.  Patient on hospice list. Transitioned to full comfort care.  Plan consult with medicine for admission for comfort care.   Nathan Grizzle, MD 03/23/20 1403  Discussed with IM teaching service and Nathan Schroeder  will see for admission   Nathan Grizzle, MD 03/29/20 1557

## 2020-03-23 NOTE — Progress Notes (Addendum)
Civil engineer, [employer] [facility]) Hospital Liaison note.   Addendum: 4:30pm: Chart reviewed by [facility] MD.  At this time pt is not eligible for inpatient hospice, with a life expectancy > 2 weeks.  TOC Heather made aware.  Updated pt's son Jonnie by phone.  Discussed comfort care as being a very appropriate plan of care; July shared hopes that allowing pt more visitors and opportunity to have food brought in by family that might be more appealing than what he was getting in the ED might allow pt to regain some strength and be a candidate for rehab.  [facility] will continue to follow as needed for coordination of care.    Received request from [facility] manager for family interest in [facility]. Chart and pt information under review by [facility] physician.  Hospice eligibility pending at this time.  [facility] is unable to offer a room today. Hospital Liaison will follow up tomorrow or sooner if a room becomes available. Please do not hesitate to call with questions.    Thank you for the opportunity to participate in this patient's care.  Gillian Scarce, BSN, RN [facility] Liaison (listed on [place] under Hospice[facility])    [phone]  (24h on call)

## 2020-03-23 NOTE — Progress Notes (Signed)
Daily Progress Note   Patient Name: Nathan Schroeder       Date: 03/23/2020 DOB: 07-25-1960  Age: 60 y.o. MRN#: [id number] Attending Physician: Default, Provider, MD Primary Care Physician: Pcp, No Admit Date: 03/10/2020  Reason for Consultation/Follow-up: Establishing goals of care  Subjective: Family meeting scheduled for 12p today. I reached out to EDP/Dr. Langston Schroeder along with LCSW/Nathan Schroeder to see if they would be willing/able to attend meeting with family today. After phone call with son/Nathan Schroeder last night, he expressed uncertainty/distrust of Palliative Medicine getting involved in patient's care. He stated he feels Palliative Care has been consulted to come in and "dump him [patient] on hospice" because he "doesn't have anywhere else to go" - he requested medical updates and EDP provider also be present at meeting today - which I tried to facilitate. Other questions/concerns he expressed I felt could also be addressed with TOC.   Chart review performed. Received report from primary RN before family meeting - no acute concerns. RN states between care patient is sleeping. She states patient was tolerant of his bath this morning and has only accepted 4 bites of applesauce with his medications; otherwise, he is not eating or drinking.   Went to visit patient at bedside - no family/visitors present. Patient was lying in bed asleep with mitts in place. No signs or non-verbal gestures of pain or discomfort noted. No respiratory distress, increased work of breathing, or secretions noted.   Met with son/Nathan Schroeder and wife/Nathan (Nathan Schroeder)  to discuss diagnosis, prognosis, GOC, EOL wishes, disposition, and options. Before meeting, I allowed family to visit with patient for several minutes.  When meeting began,  I introduced Palliative Medicine as specialized medical care for people living with serious illness. It focuses on providing relief from the symptoms and stress of a serious illness. The goal is to improve quality of life for both the patient and the family.  We discussed a brief life review of the patient as well as functional and nutritional status. 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 Nathan Schroeder explains she has seen a decline in the patient's mental status over the last year and a half; however, has had a noted decline since November 2021. At this time, Nathan Schroeder explains she noted increased agitation at times throughout the day. As  of December 2021 she was no longer able to care for Nathan Schroeder at home and he was placed in a Memory Care unit at [facility] January 16, 2020. Family explains the patient, overall, has done well there. Prior to this hospitalization, he was able to ambulate independently and he had a good appetite without issues eating or drinking. Family express the patient became increasing agitated at [facility] prior to his arrival to ED - picking up and throwing equipment and was combative toward staff, who were then unable to keep the patient and staff safe.  At this time during visit, I was joined by Dr. Langston Schroeder, Dr. Jeanell Schroeder, as well as LCSW/Nathan Schroeder.   Son/Nathan Schroeder called a trusted private practice medical provider, Dr. Anthoney Schroeder, whom he states is an outpatient geriatrician the patient has seen in the past. Nathan Schroeder wanted her to be involved in meeting today to gain her insight - she was placed on speakerphone.  Dr. Langston Schroeder provided update about patient's UTI and current course of treatment. He was unable to stay and left meeting after providing updates. I appreciate his willingness to attend meeting.   Dr. Jeanell Schroeder provided a detailed overview of patient's clinical course since his arrival to ED. Please see her note for full details on medical information  reviewed. We reviewed the patient's current state and that likely it is multifactorial to include sedating medications, UTI, physical decompensation, as well as what is likely all in the setting of progressing end stage dementia. We discussed the current balancing act of keeping the patient calm, which is requiring medications that cause increased lethargy, vs decreasing sedating medications and causing increased agitation, aggression, and combativeness (which can all be seen as suffering). Reviewed with family that Nathan Schroeder symptoms are not psychiatric issues, but expected progression course of end stage dementia. Family understands that dementia is a progressive, non-curable disease underlying the patient's current acute medical conditions. We discussed patient's current illness and what it means in the larger context of patient's on-going co-morbidities. Natural disease trajectory of dementia and expectations at EOL were discussed.  Dr. Jeanell Schroeder provided updates regarding patient's recent vital signs (soft blood pressures) and I provided updates from nursing staff that patient was only accepting minimal amounts of food/drink (4 bites today).   We attempted to elicit values and goals of care important to the patient. The difference between aggressive medical intervention and comfort care was considered in light of the patient's goals of care. Nathan Schroeder clearly states she and Nathan Schroeder have discussed DNR/DNI in the past and his wishes were for DNR/DNI. We discussed with aggressive medical care, in context of patient's minimal PO intake, that artificial feeding/hydration would need to be considered - recommendation against PEG tub was given. Nathan Schroeder is clear the patient would not want these types of aggressive, life-prolonging interventions. Family are not interested in pursuing PEG tube. Discussed levels of care - after family discussion with geriatrician/Dr. Dillard Schroeder, they were agreeable that no further lab work was  warranted, as the results of the lab work would not be what drives patient's care. Family also state they do not want escalation of care - no IV's. They were agreeable if patient declined, they would want supportive/comfort care at that time. They expressed they do not want transition to full comfort today in house.  Discussed possibility patient would meet residential hospice facility criteria as he is not eating or drinking - family requested [facility] evaluation.    Dr. Jeanell Schroeder and Dr. Dillard Schroeder left meeting. I  very much appreciate Dr. Maretta Bees assistance in providing detailed medical updates and perspectives, as well as helping PMT build rapport with family.  During this time I provided validation that family had just received a lot of information. I allowed time for therapeutic listening and provided emotional support. Family are very emotional and tearful over the patient's rapid decline. As family discussed information amongst themselves, I and LCSW remained available to answer questions as they arose.   We talked more in detail about transition to comfort measures in house and what that would entail inclusive of medications to control pain, dyspnea, agitation, nausea, itching, and hiccups. We discussed stopping all unnecessary measures such as blood draws, needle sticks, oxygen, antibiotics, CBGs/insulin, cardiac monitoring, and frequent vital signs. Family had expressed earlier their goal was to keep the patient "safe and comfortable." With the previous discussion as outlined above - reviewed that no IVs or escalation of care fits the comfort care/hospice philosphy. We reviewed other than antibiotics (which can be considered a comfort measure), the patient was not receiving other aggressive medical interventions and comfort measures in house would be an appropriate plan of care. Family asked about visitation - I reviewed current [place] EOL visitation policy. Family expressed their desire to be with  patient and were agreeable to transition to full comfort in house today. They do request that patient remain on all current medications - I explained that we could support this goal as most of his medications are to provide decreased agitation as well as his antibiotic for UTI. I explained I would also add medications for comfort PRN.  Prognostication was discussed per their request - explained if patient is not eating or drinking his time becomes more limited, likely 2 weeks or less. Family were tearful but expressed understanding.  Family asked if they were able to try and feed patient - I explained if patient was willing and accepting of food/drink offered to him, that we support providing him anything that would bring him joy. We also discussed that if patient is not willing or accepting of food, this is an expected EOL trajectory and we support listening to what the patient's body is telling us. Family expressed understanding.  Discussed with family the importance of continued conversation with the patient/each other and the medical providers regarding overall plan of care and treatment options, ensuring decisions are within the context of the patient's values and GOCs.    Questions and concerns were addressed. The family was encouraged to call with questions and/or concerns. PMT card was provided.  I escorted family back to patient's room to continue visitation. Nursing was updated on new plan of care.   I discussed with Dr. Jeanell Schroeder updates that family were agreeable to full comfort care in house today. We discussed moving patient to yellow zone for continued holding vs admission for comfort care - this would allow family and patient a more peaceful and safe environment than in [facility] holding area. Dr. Jeanell Schroeder will look into getting the patient admitted for comfort care.    I provided updates to Dr. Garnette Czech.  I went to provided updates to family - patient was awake, calm, and interacting with family.  Wife was attempting to feed patient ice cream - he seems to have accepted several bites.  Family express appreciation for meeting today.  I spoke with admitting provider, Dr. Lisabeth Devoid and provided updates from conversation today and answered all questions.   Length of Stay: 0  Current Medications: Scheduled Meds:  .  ALPRAZolam  0.5 mg Oral BID  . aspirin EC  81 mg Oral Daily  . buPROPion  300 mg Oral Daily  . divalproex  250 mg Oral TID  . donepezil  10 mg Oral QHS  . lidocaine  1 application Urethral Once  . memantine  10 mg Oral BID  . nitrofurantoin (macrocrystal-monohydrate)  100 mg Oral BID  . QUEtiapine  100 mg Oral BID  . sertraline  100 mg Oral Daily    Continuous Infusions:   PRN Meds: busPIRone  Physical Exam Vitals and nursing note reviewed.  Constitutional:      General: He is not in acute distress. Pulmonary:     Effort: No respiratory distress.  Skin:    General: Skin is warm and dry.  Neurological:     Mental Status: He is lethargic, disoriented and confused.     Motor: Weakness present.  Psychiatric:        Cognition and Memory: Cognition is impaired. Memory is impaired.             Vital Signs: BP (!) 106/58 (BP Location: Right Arm)   Pulse 63   Temp 98 F (36.7 C) (Axillary)   Resp 16   Wt 124.9 kg   SpO2 100%   BMI 37.34 kg/m  SpO2: SpO2: 100 % O2 Device: O2 Device: Room Air O2 Flow Rate:    Intake/output summary:   Intake/Output Summary (Last 24 hours) at 03/23/2020 1357 Last data filed at 03/23/2020 1203 Gross per 24 hour  Intake 350 ml  Output 1075 ml  Net -725 ml   LBM:   Baseline Weight: Weight: 124.9 kg Most recent weight: Weight: 124.9 kg       Palliative Assessment/Data: PPS 20%      Patient Active Problem List   Diagnosis Date Noted  . Alzheimer's disease ([place])   . Aggressive behavior   . Behavioral and psychological symptoms of dementia ([place])   . Ventral hernia without obstruction or gangrene 04/13/2019  .  Tinea pedis of both feet 04/13/2019  . Iron excess 04/13/2019  . Hyperlipidemia LDL goal <100 04/13/2019  . Localized swelling of both lower legs 04/13/2019  . BMI 40.0-44.9, adult ([place]) 04/13/2019  . Frequent urination 04/13/2019  . Mild cognitive impairment with memory loss 02/03/2013  . Hypertension 07/19/2012  . Memory loss 07/19/2012    Palliative Care Assessment & Plan   Patient Profile: 60 y.o. male  with past medical history of Alzheimer's dementia with behavior disturbance and HTN presented to the ED on 03/10/20 from [facility] with staff concerns with patient's behavioral disturbance and violent behavior. Once in ED patient became increasingly aggressive and violent, noted chasing and assaulting staff requiring restraints and IM medications upon arrival. Patient has been a psych hold in the ED since 03/10/20 for agitation and dementia with behavioral disturbance. Psychiatry was consulted - recommended psychiatric inpatient admission when medically cleared; however, on 03/21/20 he no longer met inpatient psychiatric criteria and was "psych cleared." TOC has been working on disposition.  Assessment: Advanced dementia UTI Terminal care  Recommendations/Plan: Initiated full comfort measures Initiated DNR/DNI - durable DNR form completed and placed in patient's chart, copy will be scanned into ACP tab/Vynca Patient potentially is residential hospice appropriate due to minimal oral intake (bites) - evaluation pending TOC sent residential hospice referral to [facility] per family request Added orders for EOL symptom management and to reflect full comfort measures Family requests patient remain on current medications, including antibiotic Unrestricted  visitation orders were placed per current [place] EOL visitation policy  Provide frequent assessments and administer PRN medications as clinically necessary to ensure EOL comfort PMT will continue to follow and support  holistically  Goals of Care and Additional Recommendations:  Limitations on Scope of Treatment: Full Comfort Care  Code Status:    Code Status Orders  (From admission, onward)         Start     Ordered   03/10/20 1513  Full code  Continuous        03/10/20 1513        Code Status History    This patient has a current code status but no historical code status.   Advance Care Planning Activity       Prognosis:   < 2 weeks  Discharge Planning:  Hospice facility  Care plan was discussed with primary RN, TOC, Dr. Jeanell Schroeder, Dr. Langston Schroeder, patient's family  Thank you for allowing the Palliative Medicine Team to assist in the care of this patient.   Total Time 145 minutes Prolonged Time Billed  yes       Greater than 50%  of this time was spent counseling and coordinating care related to the above assessment and plan.  Lin Landsman, NP  Please contact Palliative Medicine Team phone at [phone] for questions and concerns.

## 2020-03-23 NOTE — H&P (Addendum)
Date: 03/23/2020               Patient Name:  Nathan Schroeder MRN: [id number]  DOB: 11-Oct-1960 Age / Sex: 60 y.o., male   PCP: Pcp, No         Medical Service: Internal Medicine Teaching Service         Attending Physician: Dr. Hoy Finlay, Provider, MD    First Contact: Dr. Elaina Pattee Pager: [phone]  Second Contact: Dr. Karilyn Cota  Pager: [phone]       After Hours (After 5p/  First Contact Pager: [phone]  weekends / holidays): Second Contact Pager: [phone]   Chief Complaint: Agitation  History of Present Illness:  Nathan Schroeder is a 60 year old male with history of alzheimer's dementia with behavior distabance and hypertension presenting from memory care facility for increase agitation and aggressive behavior. Wife and son Nathan Schroeder and Nathan Schroeder in the room. States patient has been declining at home with ADLs around November and December refusing to shower and increasingly difficult to take care of. Has been staying at memory care unit since then and brought the ED on 2/12 after having increased agitation, refusing to change clothing, sleep in bed, take off bed, or becoming more violent at the facility.   At baseline patient will respond "yes" to questions, recognizes family members, but does not respond to commands. Prior to coming to the ED Patient was ambulatory, but unable to complete hygiene or fully control bowel or bladder function.    Family states since he has been in ED he has been weaker, initially violent with staff. More recently he has been more sleepy but intermittently agitated trying to get out of bed. With nursing has not been eating, but family was able to eat some food and drink water. After discussion with palliative, family is aware patient is unlikely patient will make up improvement, but would like to keep him on all his current medications, and continue to have PT see him and try to have him sit up or move around. Family plan to continue to try to feed him in hope it will have him  eat more. Currently pursing inpatient hospice at [facility].   Meds:  Current Facility-Administered Medications for the 03/10/20 encounter [facility] Encounter)  Medication  . lidocaine (XYLOCAINE) 2 % jelly 1 application   Current Meds  Medication Sig  . ALPRAZolam (XANAX) 1 MG tablet Take 1 mg by mouth daily as needed for sleep.  [name] ALPRAZOLAM XR 1 MG 24 hr tablet Take 1 mg by mouth daily.  [name] aspirin EC 81 MG tablet Take 81 mg by mouth daily.  [name] buPROPion (WELLBUTRIN XL) 300 MG 24 hr tablet Take 300 mg by mouth daily.  . busPIRone (BUSPAR) 15 MG tablet Take 15 mg by mouth 3 (three) times daily as needed (anxiety).  [name] divalproex (DEPAKOTE) 250 MG DR tablet Take 250 mg by mouth 3 (three) times daily.  [name] donepezil (ARICEPT) 10 MG tablet Take 10 mg by mouth at bedtime.  . furosemide (LASIX) 20 MG tablet Take 20 mg by mouth daily.  [name] lisinopril (ZESTRIL) 20 MG tablet TAKE 1 TABLET(20 MG) BY MOUTH DAILY (Patient taking differently: Take 20 mg by mouth daily.)  . memantine (NAMENDA) 10 MG tablet Take 10 mg by mouth 2 (two) times daily.  . Multiple Vitamins-Minerals (SENIOR MULTIVITAMIN PLUS PO) Take 1 tablet by mouth daily.  . naproxen sodium (ALEVE) 220 MG tablet Take 220 mg by mouth daily as  needed (pain).  . QUEtiapine (SEROQUEL) 100 MG tablet Take 100 mg by mouth 2 (two) times daily.  . sertraline (ZOLOFT) 100 MG tablet Take 1 tablet by mouth daily.  . Vitamin D, Ergocalciferol, (DRISDOL) 1.25 MG (50000 UNIT) CAPS capsule Take 50,000 Units by mouth once a week.  . [DISCONTINUED] memantine (NAMENDA XR) 28 MG CP24 24 hr capsule TK 1 C PO QD     Allergies: Allergies as of 03/10/2020  . (No Known Allergies)   Past Medical History:  Diagnosis Date  . Alzheimer's disease (HCC)   . Anxiety   . Hypertension     Family History: Mother deceased with dementia  Social History: Former Emergency planning/management officer in [place], lived with wife and family, started having cognitive decline in 2014    Review of Systems: A complete ROS was negative except as per HPI.   Physical Exam: Blood pressure (!) 106/58, pulse 63, temperature 98 F (36.7 C), temperature source Axillary, resp. rate 16, weight 124.9 kg, SpO2 100 %. Constitutional: Patient laying in bed, awake, in no acute distress Eyes: EMOI, PERRLA HENT: Normocephalic, atraumatic Pulmonary: Clear to ascultation anteriorly, no increase work of breathing Cardiovascular: RRR, no murmurs, no LE edema Abdomen: Soft, non tender, normal bowel sounds MSK: moving all 4 extremities Neuro: Awake, recognizes family members, able to say yes otherwise not verbal per baseline, not following commands, Psych: Mood normal, not agitated, normal affect  EKG: personally reviewed my interpretation is HR 59, sinus rhythm   CXR: personally reviewed my interpretation is no consolidation or infiltration, no effusion, no cardiomegaly  Assessment & Plan by Problem: Principal Problem:   Behavioral and psychological symptoms of dementia (HCC) Active Problems:   Alzheimer's disease (HCC)   Aggressive behavior  Nathan Schroeder is a 60 y/o male with a history of early onset alzheimer's disease presenting from memory care unit for aggressive behavior with rapid decline in he mental and functional status.   Alzheimer's dementia with aggressive behavior Patient presenting to ED on 2/13 for aggressive behavior at memory care unit. Patient was living at home with wife until December 2021 when she was unable to provide the level of care needed. Had been more agitating aggressive with cursing, attempting to assault staff in the ED and treated with geodon. Initially evaluated by psych with plan for inpatient geriatric psych. However while awaiting placement in the ED patient has had declining functional status with PT and refusing eating and drinking with nursing. Only taking medications with applesause. PT evaluation from day with patient unable to sit up in bed.  Family  have had goals of care conversations with palliative care while in the ED. Ultimately decided to pursue comfort care and would like hospice placement at [facility]. Family would like to continue to try to feed him while in the hospital hoping he will respond better to familiar faces. Son Nathan Schroeder would like to have patient continue with PT to help with sitting up as able.  - Palliative consulted, appreciated recommendations - Should pt start eating more may be more appropriate for SNF with hospice - Discuss with PT on seeing patient on comfort care - continue home medications  - ativan, zofran, haldol, robinul as needed -TOC: Awaiting evaluation for placement at [facility]  Asymptomatic bacteruria  UA with protein of 20, small leukocytes, and few bacteria, culture growing >100K enterococcus faecalis and staph epidermidis. Patient asymptomatic. Started on Macrobid in the ED on 03/23/2020. No sings of systemic infection, afebrile with no leukocytosis. Per  palliative family would prefer to keep him in this as it may be contributing to his decline. Unlikely to benefit from treatment, will discuss discontinuing antibiotics with family. -Macrobid  Diet: Dysphagia 1 VTE: None CODE: DNR  Dispo: Admit patient to Inpatient with expected length of stay greater than 2 midnights.  Signed: Quincy Simmonds, MD 03/23/2020, 2:38 PM  Pager: [phone] After 5pm on weekdays and 1pm on weekends: On Call pager: [phone]

## 2020-03-23 NOTE — ED Notes (Signed)
This RN emptied pts foley and checked pt brief for stool, none found. This pt offered the pt some water and pt took a couple of sips and went back to sleep.

## 2020-03-23 NOTE — ED Provider Notes (Incomplete Revision)
Participated in family meeting today from noon to 1 PM Participants included patient's wife Nathan Schroeder, son Nathan Schroeder, palliative care Girard Cooter, NP, Reeves Forth, RN transitions of care, and patient's primary care physician Dr. Erick Blinks. Please reference palliative care's full note Nathan Schroeder is a 60 year old man who has a history of dementia and had been in a dementia care unit where he had been physically active.  A symptom of his dementia, agitation and some aggressive behavior have prompted his transfer to [facility] as they were unable to [place] handle the symptoms at the unit subsequently, Nathan Schroeder has undergone decline this likely multifactorial including medications, although these have been unchanged significantly from prior but do require sedating medications, a urinary tract infection that had a culture positive although this was positive for staph epidermidis and Enterococcus.  He has not had systemic signs including no fever or white blood cell count.  He has likely had some deconditioning since being in the emergency department for now 13 days and being mostly confined to a bed.  Additionally with the above factors, this is likely culmination of his dementia. The meeting was to elicit family understanding of situation and wishes.  They did represent that Nathan Schroeder, has a DNR, and were he able to make decisions for himself would be unlikely to wish any aggressive care at this time.  Palliative care will address writing and DNR in his hospital orders. It is likely that he will be a candidate for hospice care, however we are awaiting final family determination regarding this.   Nathan Grizzle, MD 03/23/20 1401  2:02 PM  Discussed with Girard Cooter, NP, family is aligned with comfort care.  Patient on hospice list. Transitioned to full comfort care.  Plan consult with medicine for admission for comfort care.   Nathan Grizzle, MD 03/23/20 [phone]  Discussed with IM teaching service and [facility]  will see for admission

## 2020-03-23 NOTE — Discharge Planning (Signed)
Nathan Branford J. Lucretia Roers, RN, BSN, [place] [phone] RNCM spoke with pt wife Nathan Schroeder), son Nathan Schroeder), Dr Rosalia Hammers and Joice Lofts, NP regarding discharge planning for goals of care.  Family in agreement that the plan of care should move to full comfort with residential hospice referral to [facility].  RNCM contacted Nathan Scarce, RN with [facility] to arrange for residential hospice with the son, Nathan Schroeder.

## 2020-03-24 DIAGNOSIS — Z515 Encounter for palliative care: Secondary | ICD-10-CM | POA: Diagnosis not present

## 2020-03-24 DIAGNOSIS — R451 Restlessness and agitation: Secondary | ICD-10-CM | POA: Diagnosis not present

## 2020-03-24 DIAGNOSIS — G309 Alzheimer's disease, unspecified: Secondary | ICD-10-CM | POA: Diagnosis not present

## 2020-03-24 DIAGNOSIS — R4689 Other symptoms and signs involving appearance and behavior: Secondary | ICD-10-CM | POA: Diagnosis not present

## 2020-03-24 DIAGNOSIS — R8271 Bacteriuria: Secondary | ICD-10-CM

## 2020-03-24 MED ORDER — NITROFURANTOIN MONOHYD MACRO 100 MG PO CAPS
100.0000 mg | ORAL_CAPSULE | Freq: Two times a day (BID) | ORAL | Status: AC
  Administered 2020-03-24 – 2020-03-29 (×9): 100 mg via ORAL
  Filled 2020-03-24 (×10): qty 1

## 2020-03-24 NOTE — Progress Notes (Addendum)
Daily Progress Note   Patient Name: Nathan Schroeder       Date: 03/24/2020 DOB: Aug 20, 1960  Age: 60 y.o. MRN#: [id number] Attending Physician: Oda Kilts, MD Primary Care Physician: Pcp, No Admit Date: 03/10/2020  Reason for Consultation/Follow-up: Establishing goals of care  Subjective: Chart review performed. Noted patient has required PRN haldol x3 and ativan x1 since yesterday. Received report from primary RN - no acute concerns. RN reports: patient did not sleep well last night, tried to get out of bed many times, is eating/drinking minimal amounts - several bites of pudding with pills and sips of cola.   Went to visit patient at bedside - wife/[name] was present. Patient was lying in bed asleep - he does wake to voice/gentle touch but promptly falls back asleep. He is wakeful intermittently throughout my visit, but each time quickly falls back asleep. No signs or non-verbal gestures of pain or discomfort noted. No respiratory distress, increased work of breathing, or secretions noted. Patient is calm at this time.  [name] tells me that they have been encouraging oral intake as I recommended yesterday, bringing food that they feel he would enjoy - patient has accepted minimal amounts of food/liquid but nothing substantial.   I reviewed that with patient eating a little more today of foods he enjoys, this does change prognostic assessment to >2 weeks; which is likely why he was not accepted at [facility]. Family were aware he was not accepted at [facility]. We discussed options for his care going forward, which include rehab vs LTC facility with or without hospice. We discussed important considerations of Mr. Henneman current situation in context of rehab. Reviewed even though he is eating small  amounts, it is not enough to sustain him long term - it is anticipated patient will continue to get weaker and decline, despite any best efforts of rehab. We also discussed when staff attempt to work with patient or move him, he gets agitated  -this inhibits PT to successfully provide beneficial therapy services as well as causing anxiety and discomfort for the patient. We then discussed the alternative option of LTC facility with supportive care of home hospice. Home hospice services were reviewed. After detailed discussion, [name] states she feels LTC is the best option for Mr. Bunyard at this time; however, would like her  son/Kiley involved in decision making. Per [name]'s request, reviewed that patient was still receiving antibiotics for UTI.  We called son/Dionis and spoke to him via phone. Discussed options again in detail as outlined above. [place] has a very clear understanding of the patient's current situation. After discussion, Jamieson also feels patient would not find benefit from rehab and would like to find placement at [facility] facility. [place] lives out of town and requests that [facility] provide [name] with hard copy of facility options and he would like a copy emailed to him - he provided email, which I shared with TOC. Annie Main and [name] request we try and find facility close to where [place] lives in [place], as she would like to visit him often - Event organiser specifically mentioned [facility], [place], [place]. We also discussed patient's hard time sleeping last night and him sleeping throughout the day today - discussed that he could likely have rotated his sleep/wake cycle while in ED - encouraged and discussed good sleep hygiene. Keoni asks if nursing staff can try and feed patient food family provides, as they feel he responds better to eating food he enjoys. I encouraged family to notify nursing staff when they bring food so they are aware and can encourage patient to eat/drink what they  provide - [place] was appreciative. We again reviewed natural disease trajectory of dementia, including decreased interest in eating/drinking. Family express understanding and I provided validation that I felt they were really "listening to the patient's body" by not force feeding him, but allowing him to accept food/drink as he is willing.  Therapeutic listening and emotional support given to [name] as she reflects on her and patient's 88 year marriage.   Notified nursing staff and TOC of requests mentioned above.   Length of Stay: 1  Current Medications: Scheduled Meds:  . ALPRAZolam  0.5 mg Oral BID  . buPROPion  300 mg Oral Daily  . divalproex  250 mg Oral TID  . donepezil  10 mg Oral QHS  . memantine  10 mg Oral BID  . nitrofurantoin (macrocrystal-monohydrate)  100 mg Oral BID  . QUEtiapine  100 mg Oral BID  . sertraline  100 mg Oral Daily    Continuous Infusions:   PRN Meds: acetaminophen **OR** acetaminophen, antiseptic oral rinse, busPIRone, glycopyrrolate **OR** glycopyrrolate **OR** glycopyrrolate, haloperidol **OR** haloperidol, LORazepam **OR** LORazepam, morphine injection, ondansetron **OR** ondansetron (ZOFRAN) IV, polyvinyl alcohol  Physical Exam Vitals and nursing note reviewed.  Constitutional:      General: He is not in acute distress. Pulmonary:     Effort: No respiratory distress.  Skin:    General: Skin is warm and dry.  Neurological:     Mental Status: He is lethargic, disoriented and confused.     Motor: Weakness present.  Psychiatric:        Cognition and Memory: Cognition is impaired. Memory is impaired.             Vital Signs: BP (!) 99/51 (BP Location: Right Arm)   Pulse 65   Temp 98 F (36.7 C)   Resp 18   Wt 124.9 kg   SpO2 99%   BMI 37.34 kg/m  SpO2: SpO2: 99 % O2 Device: O2 Device: Room Air O2 Flow Rate:    Intake/output summary:   Intake/Output Summary (Last 24 hours) at 03/24/2020 1355 Last data filed at 03/24/2020 0200 Gross  per 24 hour  Intake --  Output 1325 ml  Net -1325 ml   LBM: Last  BM Date: 03/23/20 Baseline Weight: Weight: 124.9 kg Most recent weight: Weight: 124.9 kg       Palliative Assessment/Data: PPS 20%      Patient Active Problem List   Diagnosis Date Noted  . End of life care 03/23/2020  . Alzheimer's disease ([place])   . Aggressive behavior   . Behavioral and psychological symptoms of dementia ([place])   . Ventral hernia without obstruction or gangrene 04/13/2019  . Tinea pedis of both feet 04/13/2019  . Iron excess 04/13/2019  . Hyperlipidemia LDL goal <100 04/13/2019  . Localized swelling of both lower legs 04/13/2019  . BMI 40.0-44.9, adult ([place]) 04/13/2019  . Frequent urination 04/13/2019  . Mild cognitive impairment with memory loss 02/03/2013  . Hypertension 07/19/2012  . Memory loss 07/19/2012    Palliative Care Assessment & Plan   Patient Profile: 60 y.o.malewith past medical history of Alzheimer's dementia with behavior disturbance and HTN presented to the ED on 03/10/20 from [facility] with staff concerns with patient's behavioral disturbance and violent behavior.Once in ED patientbecame increasingly aggressive and violent, noted chasing and assaulting staff requiring restraints and IM medications upon arrival.Patient has been a psych hold in the ED since 03/10/20 for agitation and dementia with behavioral disturbance. Psychiatry was consulted - recommended psychiatric inpatient admission when medically cleared; however, on 03/21/20 he no longer met inpatient psychiatric criteria and was "psych cleared." TOC has been working on disposition.  Assessment: Advanced dementia Agitation UTI Terminal care  Recommendations/Plan:  Continue full comfort measures  Continue DNR/DNI as previously documented  Patient was not approved for [facility] - family are not interested in rehab, would like placement at [place] facility  Ongoing home hospice discussions  [facility]  notified and consult placed for: LTC placement, preferably at location close to where wife lives in [place] (asked about [facility], [place], [place])  Continue current comfort focused medications, including antibiotic - patient appears comfortable   Provide good sleep hygiene techniques and assess for improvement in patient's sleep/wake cycle  Nursing to provide frequent assessments and administer PRN medications as clinically necessary to ensure EOL comfort  PMT will continue to follow and support holistically  Goals of Care and Additional Recommendations:  Limitations on Scope of Treatment: Full Comfort Care  Code Status:    Code Status Orders  (From admission, onward)         Start     Ordered   03/23/20 1439  Do not attempt resuscitation (DNR)  Continuous       Question Answer Comment  In the event of cardiac or respiratory ARREST Do not call a "code blue"   In the event of cardiac or respiratory ARREST Do not perform Intubation, CPR, defibrillation or ACLS   In the event of cardiac or respiratory ARREST Use medication by any route, position, wound care, and other measures to relive pain and suffering. May use oxygen, suction and manual treatment of airway obstruction as needed for comfort.      03/23/20 1439        Code Status History    Date Active Date Inactive Code Status Order ID Comments User Context   03/23/2020 1406 03/23/2020 1439 DNR [id number]  Lin Landsman, NP ED   03/10/2020 1513 03/23/2020 1406 Full Code [id number]  Isla Pence, MD ED   Advance Care Planning Activity       Prognosis:   < 6 months  Discharge Planning:  LTC facility with or without hospice, discussions ongoing  Care plan was discussed with primary RN, Dr. Rebeca Alert, Dr. Laural Golden, [facility], patient's wife and son  Thank you for allowing the Palliative Medicine Team to assist in the care of this patient.   Total Time 70 minutes Prolonged Time Billed  yes       Greater  than 50%  of this time was spent counseling and coordinating care related to the above assessment and plan.  Lin Landsman, NP  Please contact Palliative Medicine Team phone at [phone] for questions and concerns.

## 2020-03-24 NOTE — Progress Notes (Signed)
HD#1 Subjective:  Overnight Events: None  Patient evaluated at bedside. Laying comfortably in bed. Alert and awake in bed. Does not indicate he is currently in pain.   Objective:  Vital signs in last 24 hours: Vitals:   03/22/20 2100 03/23/20 0547 03/23/20 1444 03/23/20 1615  BP: (!) 102/54 (!) 106/58 (!) 106/51 116/66  Pulse: (!) 59 63 67 67  Resp: 17 16 16 12   Temp:  98 F (36.7 C) 97.9 F (36.6 C) 98 F (36.7 C)  TempSrc:  Axillary Axillary Axillary  SpO2: 98% 100% 96% 97%  Weight:       Supplemental O2: Room Air SpO2: 97 %  Physical Exam:  Constitutional: Patient laying in bed, awake, appears comfortable, in no acute distress Eyes: EMOI, PERRLA HENT: Normocephalic, atraumatic Pulmonary: Clear to ascultation anteriorly, no increase work of breathing Cardiovascular: RRR, no murmurs, no LE edema Abdomen: Soft, non tender, normal bowel sounds MSK: moving all 4 extremities Neuro: Awake, able to say yes and a few other words, not following commands Psych: Mood normal, not agitated, normal affect  Filed Weights   03/17/20 2039  Weight: 124.9 kg     Intake/Output Summary (Last 24 hours) at 03/24/2020 0619 Last data filed at 03/24/2020 0200 Gross per 24 hour  Intake 150 ml  Output 1325 ml  Net -1175 ml   Net IO Since Admission: -6,450 mL [03/24/20 0619]  Pertinent Labs: CBC Latest Ref Rng & Units 03/22/2020 03/10/2020 04/06/2019  WBC 4.0 - 10.5 K/uL 6.1 4.1 4.9  Hemoglobin 13.0 - 17.0 g/dL 06/06/2019 46.2 70.3  Hematocrit 39.0 - 52.0 % 44.0 43.5 45.4  Platelets 150 - 400 K/uL 196 148(L) 173    CMP Latest Ref Rng & Units 03/22/2020 03/17/2020 03/10/2020  Glucose 70 - 99 mg/dL 05/08/2020) 938(H) 95  BUN 8 - 23 mg/dL 829(H) 37(J) 12  Creatinine 0.61 - 1.24 mg/dL 69(C 7.89 3.81  Sodium 135 - 145 mmol/L 133(L) 134(L) 139  Potassium 3.5 - 5.1 mmol/L 4.1 4.2 4.1  Chloride 98 - 111 mmol/L 100 100 103  CO2 22 - 32 mmol/L 22 23 26   Calcium 8.9 - 10.3 mg/dL 9.1 9.1 9.3  Total  Protein 6.5 - 8.1 g/dL 7.8 - 7.9  Total Bilirubin 0.3 - 1.2 mg/dL 0.8 - 1.1  Alkaline Phos 38 - 126 U/L 62 - 61  AST 15 - 41 U/L 40 - 31  ALT 0 - 44 U/L 54(H) - 26    Imaging: No results found.  Assessment/Plan:   Principal Problem:   End of life care Active Problems:   Alzheimer's disease (HCC)   Aggressive behavior   Behavioral and psychological symptoms of dementia [facility])   Patient Summary: [name] is a 60 y/o male with a history of early onset alzheimer's disease presenting from memory care unit for aggressive behavior with rapid decline in he mental and functional status in setting of pressing end stage dementia admitted for comfort care.   Alzheimer's dementia  Patient with end stage dementia. Appears to be comfortable laying in bed this morning. Awake and responsive indicating he is wearing mittens, does not appear to be in pain. Family working with palliative and pursing residential hospice facility placement. After GOC discussion with palliative family would like to avoid IVs, feeding tubes or other invasive procedures. Would like to continue to try to feed him and continue with sitting up and PT is he makes any progress. - Palliative consulted, appreciated recommendations - Should pt start  eating more may be more appropriate for SNF with hospice - Discuss with PT on seeing patient on comfort care  - continue home medications  - ativan, zofran, haldol, robinul as needed -TOC: Awaiting evaluation for [facility] at [facility]  Asymptomatic bacteruria  UA with protein of 20, small leukocytes, and few bacteria, culture growing >100K enterococcus faecalis and staph epidermidis. Patient asymptomatic. Started on Macrobid in the ED on 03/23/2020. No sings of systemic infection, afebrile with no leukocytosis. Per palliative family would prefer to keep him in this as it may be contributing to his decline. Will treat for total of 5 days. -Macrobid for 5 days  Diet: Dysphagia  1 VTE: None CODE: DNR  Dispo: Anticipated discharge to Hospice in 2 days.  Quincy Simmonds, MD 03/24/2020, 6:19 AM Pager: [phone]  Please contact the on call pager after 5 pm and on weekends at [phone].

## 2020-03-25 DIAGNOSIS — R451 Restlessness and agitation: Secondary | ICD-10-CM | POA: Diagnosis not present

## 2020-03-25 DIAGNOSIS — R4689 Other symptoms and signs involving appearance and behavior: Secondary | ICD-10-CM | POA: Diagnosis not present

## 2020-03-25 DIAGNOSIS — G309 Alzheimer's disease, unspecified: Secondary | ICD-10-CM | POA: Diagnosis not present

## 2020-03-25 DIAGNOSIS — Z515 Encounter for palliative care: Secondary | ICD-10-CM | POA: Diagnosis not present

## 2020-03-25 NOTE — TOC Progression Note (Signed)
Transition of Care [facility]) - Progression Note    Patient Details  Name: Nathan Schroeder MRN: [id number] Date of Birth: 01/31/1960  Transition of Care [facility]) CM/SW Contact  Patrice Paradise, [place] Phone Number: [phone] 03/25/2020, 4:19 PM  Clinical Narrative:     CSW was alerted by Palliative NP Amber that patient's wife is willing to extend her search to [facility] for LTC if needed and the plan is still for patient's discharge plan is for LTC and not a SNF. CSW let her know that it would be noted in a note.  TOC team will continue to assist with discharge planning needs.    Expected Discharge Plan: Skilled Nursing Facility Barriers to Discharge: Requiring sitter/restraints  Expected Discharge Plan and Services Expected Discharge Plan: Skilled Nursing Facility       Living arrangements for the past 2 months: Assisted Living Facility (Memory Care)                                       Social Determinants of Health (SDOH) Interventions    Readmission Risk Interventions No flowsheet data found.

## 2020-03-25 NOTE — Plan of Care (Signed)
  Problem: Education: Goal: Knowledge of General Education information will improve Description: Including pain rating scale, medication(s)/side effects and non-pharmacologic comfort measures Outcome: Progressing   Problem: Health Behavior/Discharge Planning: Goal: Ability to manage health-related needs will improve Outcome: Progressing   Problem: Activity: Goal: Risk for activity intolerance will decrease Outcome: Progressing   Problem: Nutrition: Goal: Adequate nutrition will be maintained Outcome: Progressing   Problem: Elimination: Goal: Will not experience complications related to urinary retention Outcome: Progressing   Problem: Pain Managment: Goal: General experience of comfort will improve Outcome: Progressing   Problem: Safety: Goal: Ability to remain free from injury will improve Outcome: Progressing   Problem: Skin Integrity: Goal: Risk for impaired skin integrity will decrease Outcome: Progressing   
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
## 2020-03-25 NOTE — Progress Notes (Signed)
Subjective: Patient resting comfortably in bed this morning. Family at the bedside states he has been eating/drinking more with their assistance and with foods he enjoys.    Objective:  Vital signs in last 24 hours: Vitals:   03/23/20 1444 03/23/20 1615 03/24/20 0640 03/25/20 0541  BP: (!) 106/51 116/66 (!) 99/51 120/77  Pulse: 67 67 65 66  Resp: 16 12 18 17   Temp: 97.9 F (36.6 C) 98 F (36.7 C) 98 F (36.7 C) 98.1 F (36.7 C)  TempSrc: Axillary Axillary  Oral  SpO2: 96% 97% 99% 100%  Weight:       Physical Exam Vitals reviewed.  Constitutional:      General: He is not in acute distress.    Appearance: Normal appearance. He is not ill-appearing.  HENT:     Head: Normocephalic and atraumatic.  Eyes:     Extraocular Movements: Extraocular movements intact.     Conjunctiva/sclera: Conjunctivae normal.  Cardiovascular:     Rate and Rhythm: Normal rate and regular rhythm.     Heart sounds: Normal heart sounds. No murmur heard. No friction rub. No gallop.   Pulmonary:     Effort: Pulmonary effort is normal. No respiratory distress.     Breath sounds: Normal breath sounds. No wheezing or rales.  Abdominal:     General: Abdomen is flat. Bowel sounds are normal. There is no distension.     Palpations: Abdomen is soft.     Tenderness: There is no abdominal tenderness. There is no guarding.  Musculoskeletal:        General: No swelling or tenderness. Normal range of motion.     Right lower leg: No edema.     Left lower leg: No edema.  Skin:    General: Skin is warm and dry.  Neurological:     Mental Status: He is alert. He is disoriented.     Comments: Does not verbally communicate much, mostly says yes     Assessment/Plan:  Principal Problem:   End of life care Active Problems:   Alzheimer's disease (HCC)   Aggressive behavior   Behavioral and psychological symptoms of dementia [facility])   Mr. Helms is a 60 y/o male with a history of early onset alzheimer's  disease presenting from memory care unit for aggressive behavior with rapid decline in he mental and functional status.   Alzheimer's dementia with aggressive behavior End-of-life care Patient initially presented for aggressive behavior at his memory care unit and had a significant decline in his functional status while in the hospital. He was initially refusing to eat or drink however this has improved with family's assistance and with foods that he previously enjoyed. At this point his prognosis is greater than 2 weeks which does not allow him to qualify for [facility]. Goals of care are ongoing with palliative care with plans for placement into LTC facility with or without hospice. Palliative care also discussed with the family that PT may not be able to work with the patient due to him being so agitated and refusing. - Palliative consulted, appreciated recommendations - PT as able - continue home medications  - ativan, zofran, haldol, robinul as needed -TOC: Awaiting evaluation for placement into LTC facility  Asymptomatic bacteruria  Patient remains asymptomatic without any signs of infection. Hemodynamically stable without fevers or leukocytosis. Family prefers that the patient remain on antibiotic therapy due to concerns that this may be contributing to his decline.  -Continue macrobid for total of 5 days  Prior to Admission Living Arrangement: Memory care unit Anticipated Discharge Location: Long-term facility with hospice care Barriers to Discharge: Placement Dispo: Anticipated discharge pending placement.   Assia Meanor N, DO 03/25/2020, 7:18 AM Pager: [phone] After 5pm on weekdays and 1pm on weekends: On Call pager [phone]

## 2020-03-25 NOTE — TOC Progression Note (Signed)
Transition of Care [facility]) - Progression Note    Patient Details  Name: Nathan Schroeder MRN: [id number] Date of Birth: Sep 27, 1960  Transition [facility]) CM/SW Contact  Patrice Paradise, LCSW Phone Number: [phone] 03/25/2020, 11:02 AM  Clinical Narrative:     CSW received a message from Palliative NP Amber that during the family discussion and they think that patient best course of treatment would be LTC with memory care. Patient exhibits behaviors with his dementia which may be a barrier for placement options.  CSW spoke with patient's wife Nathan Schroeder and she confirmed that patient was a resident of [place] however is he is unable to return. [name] requested if patient could be near [place]. [name] mention [employer]. The discussion of SNF was talked about however patient does not do well with rehab and becomes agitated.  TOC team will continue to assist with discharge planning needs.     Expected Discharge Plan: Skilled Nursing Facility Barriers to Discharge: Requiring sitter/restraints  Expected Discharge Plan and Services Expected Discharge Plan: Skilled Nursing Facility       Living arrangements for the past 2 months: Assisted Living Facility (Memory Care)                                       Social Determinants of Health (SDOH) Interventions    Readmission Risk Interventions No flowsheet data found.

## 2020-03-26 DIAGNOSIS — F0391 Unspecified dementia with behavioral disturbance: Secondary | ICD-10-CM | POA: Diagnosis not present

## 2020-03-26 DIAGNOSIS — R4689 Other symptoms and signs involving appearance and behavior: Secondary | ICD-10-CM | POA: Diagnosis not present

## 2020-03-26 DIAGNOSIS — R451 Restlessness and agitation: Secondary | ICD-10-CM | POA: Diagnosis not present

## 2020-03-26 DIAGNOSIS — Z515 Encounter for palliative care: Secondary | ICD-10-CM | POA: Diagnosis not present

## 2020-03-26 NOTE — Care Management Important Message (Signed)
Important Message  Patient Details  Name: Nathan Schroeder MRN: [id number] Date of Birth: 05/21/1960   Medicare Important Message Given:  Yes     [name] 03/26/2020, 2:49 PM

## 2020-03-26 NOTE — Progress Notes (Signed)
RN updating son, who states now that patient is improving he is considering reversing DNR orders, wants medical interventions restarted since he doesn't think his father is near death like he once was. They want foley removed, physical therapy restarted and labs to be collected daily again. Palliative care has been notified. Resident paged and awaiting call back. Will inform CSW/CM.

## 2020-03-26 NOTE — Progress Notes (Signed)
Foley 1400- Patient and family wanting to remove foley. Paged MD to remove. Order placed to remove Foley 1445. RN to room to remove foley per request. Patient resting peacefully. Family requests to wait on pulling foley. 1645- RN attempting to remove foley. Family spoke with Palliative care and request to keep foley at least for tonight. Son requests RN not to remove foley catheter. 1800. IM team made aware of situation and are ok with keeping foley overnight and in am we can reassess if foley can be removed. Will keep order in Epic.

## 2020-03-26 NOTE — TOC Initial Note (Signed)
Transition of Care [facility]) - Initial/Assessment Note    Patient Details  Name: Nathan Schroeder MRN: [id number] Date of Birth: 27-Oct-1960  [facility]) CM/SW Contact:    Jimmy Picket, [place] Phone Number: 03/26/2020, 4:15 PM  Clinical Narrative:                  CSW spoke to pts son Abhishek. Jaymian stated pt was at [facility] ALF but has been discharged due to behaviors. Pt has been there since December, prior to that he was at home with his wife.   CSW reviewed pt/ot reccs with son. Ebenezer stated SNF is fine and hopes he can return to ALF after.   CSW explained that patients insurance has limited SNF options. CSW also explained that placement may be difficult due to pts behaviors. Lastly CSW explained that LTC at a SNF is not covered by insurance. Carmino stated he understands these things. Rulon Eisenmenger states he just wants a safe place for his dad to be.   Expected Discharge Plan: Skilled Nursing Facility Barriers to Discharge: Requiring sitter/restraints   Patient Goals and CMS Choice   CMS Medicare.gov Compare Post Acute Care list provided to:: Patient Represenative (must comment) Bentler, Marcellous Snarski   [phone]) Choice offered to / list presented to : Adult Children Yancarlos, Berthold   [phone])  Expected Discharge Plan and Services Expected Discharge Plan: Skilled Nursing Facility       Living arrangements for the past 2 months: Assisted Living Facility (Memory Care)                                      Prior Living Arrangements/Services Living arrangements for the past 2 months: Assisted Living Facility (Memory Care) Lives with:: Other (Comment) (Memory Care) Patient language and need for interpreter reviewed::  (Not needed)        Need for Family Participation in Patient Care: Yes (Comment) Care giver support system in place?: Yes (comment) Odarius, Dines   [phone])   Criminal Activity/Legal Involvement Pertinent to Current  Situation/Hospitalization: No - Comment as needed  Activities of Daily Living      Permission Sought/Granted Permission sought to share information with : Facility Medical sales representative Permission granted to share information with : Yes, Verbal Permission Granted  Share Information with NAME: Brance, Dartt   [phone]     Permission granted to share info w Relationship: Airon, Sahni   [phone]  Permission granted to share info w Contact Information: Akram, Kissick   [phone]  Emotional Assessment Appearance:: Appears stated age,Well-Groomed Attitude/Demeanor/Rapport: Inconsistent Affect (typically observed): Quiet Orientation: : Oriented to Self Alcohol / Substance Use: Not Applicable Psych Involvement: Yes (comment) (Psych cleared 03/21/20)  Admission diagnosis:  Agitation [R45.1] End of life care [Z51.5] Dementia with behavioral disturbance, unspecified dementia type (HCC) [F03.91] Patient Active Problem List   Diagnosis Date Noted  . End of life care 03/23/2020  . Alzheimer's disease (HCC)   . Aggressive behavior   . Behavioral and psychological symptoms of dementia (HCC)   . Ventral hernia without obstruction or gangrene 04/13/2019  . Tinea pedis of both feet 04/13/2019  . Iron excess 04/13/2019  . Hyperlipidemia LDL goal <100 04/13/2019  . Localized swelling of both lower legs 04/13/2019  . BMI 40.0-44.9, adult (HCC) 04/13/2019  . Frequent urination 04/13/2019  . Mild cognitive impairment with memory loss 02/03/2013  .  Hypertension 07/19/2012  . Memory loss 07/19/2012   PCP:  Pcp, No Pharmacy:   [id number] - [place], [place] - [address] AT [facility] [address] [place] [address] Phone: [phone] Fax: [phone]  [facility] [place], [place] - [address] [place] [address] Phone: [phone]  Fax: [phone]     Social Determinants of Health (SDOH) Interventions    Readmission Risk Interventions No flowsheet data found.  Jimmy Picket, Theresia Majors, [place] Clinical Social Worker [phone]

## 2020-03-26 NOTE — Plan of Care (Signed)
  Problem: Nutrition: Goal: Adequate nutrition will be maintained Outcome: Progressing   Problem: Nutrition: Goal: Adequate nutrition will be maintained Outcome: Progressing   
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
## 2020-03-26 NOTE — Progress Notes (Signed)
Nutrition Brief Note  Chart reviewed. Pt now transitioning to comfort care.  No further nutrition interventions warranted at this time.  Please re-consult as needed.   Diannia Hogenson W, RD, LDN, CDCES Registered Dietitian II Certified Diabetes Care and Education Specialist Please refer to AMION for RD and/or RD on-call/weekend/after hours pager  
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
## 2020-03-26 NOTE — Progress Notes (Addendum)
Subjective:  Patient evaluated at bedside. Son Nathan Schroeder at bedside. States patient is more awake and alert this morning able to say that foley is uncomfortable. Feels that patient is more alert now that he is eating more. Is open to LTC for patient, worried PT may increase his agitation. Patient intermittently pulling at foley but otherwise appears comfortable.  Objective:  Vital signs in last 24 hours: Vitals:   03/23/20 1444 03/23/20 1615 03/24/20 0640 03/25/20 0541  BP: (!) 106/51 116/66 (!) 99/51 120/77  Pulse: 67 67 65 66  Resp: 16 12 18 17   Temp: 97.9 F (36.6 C) 98 F (36.7 C) 98 F (36.7 C) 98.1 F (36.7 C)  TempSrc: Axillary Axillary  Oral  SpO2: 96% 97% 99% 100%  Weight:       Physical Exam Vitals reviewed.  Constitutional:      General: He is not in acute distress.    Appearance: Normal appearance. He is not ill-appearing.  HENT:     Head: Normocephalic and atraumatic.  Eyes:     Extraocular Movements: Extraocular movements intact.     Conjunctiva/sclera: Conjunctivae normal.  Cardiovascular:     Rate and Rhythm: Normal rate and regular rhythm.     Heart sounds: Normal heart sounds. No murmur heard. No friction rub. No gallop.   Pulmonary:     Effort: Pulmonary effort is normal. No respiratory distress.     Breath sounds: Normal breath sounds. No wheezing or rales.  Abdominal:     General: Abdomen is flat. Bowel sounds are normal. There is no distension.     Palpations: Abdomen is soft.     Tenderness: There is no abdominal tenderness. There is no guarding.  Musculoskeletal:        General: No swelling or tenderness. Normal range of motion.     Right lower leg: No edema.     Left lower leg: No edema.  Skin:    General: Skin is warm and dry.  Neurological:     Mental Status: He is alert. He is disoriented.     Comments: Does not verbally communicate much, mostly says yes. intermittently pulling at foley     Assessment/Plan:  Principal Problem:   End  of life care Active Problems:   Alzheimer's disease (HCC)   Aggressive behavior   Behavioral and psychological symptoms of dementia [facility])    Nathan Schroeder is a 60 y/o male with a history of early onset alzheimer's disease presenting from memory care unit for aggressive behavior with rapid decline in he mental and functional status.    Alzheimer's dementia with aggressive behavior End-of-life care Patient initially presented for aggressive behavior at his memory care unit and had a significant decline in his functional status while in the hospital. He was initially refusing to eat or drink however this has improved with family's assistance and with foods that he previously enjoyed. At this point his prognosis is greater than 2 weeks which does not allow him to qualify for [facility], although not eating enough to sustain himself in the long term. Goals of care are ongoing with palliative care with plans for placement into LTC facility with or without hospice. Family concern increasing strength may worsen agitation. Social working on placement but may difficult given history of agitation and behavorial issues. - Palliative consulted, appreciated recommendations - continue home medications  - ativan, zofran, haldol, robinul as needed -TOC: Awaiting evaluation for placement into LTC facility   Asymptomatic bacteruria  Patient remains  asymptomatic without any signs of infection. Hemodynamically stable without fevers or leukocytosis. Family prefers that the patient remain on antibiotic therapy due to concerns that this may be contributing to his decline.  -Continue macrobid for total of 5 days   Prior to Admission Living Arrangement: Memory care unit Anticipated Discharge Location: Long-term facility with hospice care Barriers to Discharge: Placement Dispo: Anticipated discharge pending placement.   Nathan Simmonds, MD 03/26/2020, 6:49 AM Pager: [phone] After 5pm on weekdays and 1pm on weekends:  On Call pager [phone]

## 2020-03-27 DIAGNOSIS — R451 Restlessness and agitation: Secondary | ICD-10-CM | POA: Diagnosis not present

## 2020-03-27 DIAGNOSIS — R4689 Other symptoms and signs involving appearance and behavior: Secondary | ICD-10-CM | POA: Diagnosis not present

## 2020-03-27 DIAGNOSIS — Z515 Encounter for palliative care: Secondary | ICD-10-CM | POA: Diagnosis not present

## 2020-03-27 DIAGNOSIS — F0391 Unspecified dementia with behavioral disturbance: Secondary | ICD-10-CM | POA: Diagnosis not present
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
## 2020-03-27 NOTE — Progress Notes (Addendum)
   Subjective:   No acute overnight events. Foley removed earlier today. Refusing his daily medications today.  [name] mental status at baseline, unable to carry a conversation but responds to questions with a "yes."   Objective:  Vital signs in last 24 hours: Vitals:   03/25/20 0541 03/26/20 1602 03/26/20 2130 03/27/20 0543  BP: 120/77 113/74 120/74 134/85  Pulse: 66 67 63 69  Resp: 17 18 18 18   Temp: 98.1 F (36.7 C) 97.6 F (36.4 C) 98.6 F (37 C) 98.3 F (36.8 C)  TempSrc: Oral Axillary Axillary Axillary  SpO2: 100% 98% 100% 100%  Weight:       General: Elderly male, lying in bed, NAD. CV: normal rate and regular rhythm, no m/r/g Pulm: CTABL, no adventitious sounds noted. MSK: No edema noted. Neuro: alert and at baseline. Unable to have conversation, only responds with "yes" to most questions.  Assessment/Plan:  Principal Problem:   End of life care Active Problems:   Alzheimer's disease (HCC)   Aggressive behavior   Behavioral and psychological symptoms of dementia (HCC)  Shellie Goettl is a 60 year old male with history of early onset alzheimer's dementia who presented from memory care unit for aggressive behavior/agitation with rapid decline in mental and functional status.  Early onset Alzheimer's dementia with agitation Discussed goals of care with family yesterday with plan to keep patient's code status as DNR, but to optimize medical care in anticipation for long term care facility placement with hospice care. Palliative care on board for ongoing goals of care discussions with family. Social work continuing to help with facility placement. No further need for sitter at this time as he appears calm on our evaluation. -continue home medications -ativan, zofran, haldol, robinul as needed -appreciate palliative care recs -appreciate TOC help with placement    Prior to Admission Living Arrangement: Memory care unit Anticipated Discharge Location: LTACH with  hospice care Barriers to Discharge: Placement Dispo: Anticipated discharge in approximately  day(s).   77, MD 03/27/2020, 11:25 AM Pager: [phone] After 5pm on weekdays and 1pm on weekends: On Call pager [phone]

## 2020-03-28 DIAGNOSIS — R451 Restlessness and agitation: Secondary | ICD-10-CM | POA: Diagnosis not present

## 2020-03-28 DIAGNOSIS — Z515 Encounter for palliative care: Secondary | ICD-10-CM | POA: Diagnosis not present

## 2020-03-28 DIAGNOSIS — F0391 Unspecified dementia with behavioral disturbance: Secondary | ICD-10-CM | POA: Diagnosis not present

## 2020-03-28 DIAGNOSIS — Z7189 Other specified counseling: Secondary | ICD-10-CM | POA: Diagnosis not present

## 2020-03-28 MED ORDER — ENOXAPARIN SODIUM 40 MG/0.4ML ~~LOC~~ SOLN
40.0000 mg | SUBCUTANEOUS | Status: DC
  Administered 2020-03-28 – 2020-05-28 (×54): 40 mg via SUBCUTANEOUS
  Filled 2020-03-28 (×60): qty 0.4

## 2020-03-28 NOTE — Progress Notes (Signed)
Physical Therapy Re-Evaluation Patient Details Name: Nathan Schroeder MRN: [id number] DOB: 08-14-1960 Today's Date: 03/28/2020   History of Present Illness  60 y.o. male admitted on 03/10/20 for dementia with behavioral disturbance.  Pt from [facility]F.  Pt with significant PMH of HTN, anxiety, Alzheimer's disease.  Clinical Impression  PTA, patient was ambulatory with no AD per son. Patient requires totalA for supine>sit as patient has difficulty following commands due to hx of dementia. Patient able to stand EOB with modA+2, initially with RW, however patient would not grasp RW so reverted to HHAx1. Patient able to reach and fist bump son with no LOB noted. Able to stand with min guard at times. Continue to recommend SNF for ongoing Physical Therapy.       Follow Up Recommendations SNF    Equipment Recommendations  Hospital bed;Wheelchair cushion (measurements PT);Wheelchair (measurements PT)    Recommendations for Other Services       Precautions / Restrictions Precautions Precautions: Fall Restrictions Weight Bearing Restrictions: No      Mobility  Bed Mobility Overal bed mobility: Needs Assistance Bed Mobility: Supine to Sit;Sit to Supine     Supine to sit: Total assist Sit to supine: Max assist   General bed mobility comments: totalA to get EOB due to inability to follow commands. MaxA to return as patient assisted with trunk    Transfers Overall transfer level: Needs assistance Equipment used: Rolling Chey Rachels (2 wheeled) Transfers: Sit to/from Stand Sit to Stand: Mod assist;+2 physical assistance;+2 safety/equipment         General transfer comment: modA+2 to stand from EOB with RW. Patient would not grasp RW so reverted to HHAx1. Son present and stood in front of patient to assist with cueing. patient able to reach for son's hand and give fist bump with no LOB. Patient able to stand with min guard at times  Ambulation/Gait              General Gait Details: attempted, however patient unable to advance feet  Stairs            Wheelchair Mobility    Modified Rankin (Stroke Patients Only)       Balance Overall balance assessment: Needs assistance Sitting-balance support: Feet supported;Bilateral upper extremity supported Sitting balance-Leahy Scale: Fair Sitting balance - Comments: patient able to sit EOB with min guard   Standing balance support: Single extremity supported;During functional activity Standing balance-Leahy Scale: Poor Standing balance comment: reliant on UE support, able to reach for son's hand to give fist bump                             Pertinent Vitals/Pain Pain Assessment: Faces Faces Pain Scale: No hurt Pain Intervention(s): Monitored during session    Home Living Family/patient expects to be discharged to:: Skilled nursing facility                 Additional Comments: From [facility]/ALF, however unable to return there    Prior Function Level of Independence: Needs assistance   Gait / Transfers Assistance Needed: Per son, patient was ambulatory prior to admission           Hand Dominance        Extremity/Trunk Assessment   Upper Extremity Assessment Upper Extremity Assessment: Overall WFL for tasks assessed    Lower Extremity Assessment Lower Extremity Assessment: Overall WFL for tasks assessed    Cervical /  Trunk Assessment Cervical / Trunk Assessment: Normal  Communication   Communication: Other (comment) (one word responses)  Cognition Arousal/Alertness: Lethargic Behavior During Therapy: Flat affect Overall Cognitive Status: History of cognitive impairments - at baseline                                 General Comments: Did not answer orientation questions. When asked name, he stated "me". No agitation this session. Follows commands 25% of the time      General Comments General comments (skin  integrity, edema, etc.): No signs of aggression during session    Exercises     Assessment/Plan    PT Assessment Patient needs continued PT services  PT Problem List Decreased strength;Decreased activity tolerance;Decreased balance;Decreased mobility;Decreased cognition;Decreased safety awareness;Decreased knowledge of precautions       PT Treatment Interventions DME instruction;Gait training;Stair training;Functional mobility training;Therapeutic exercise;Therapeutic activities;Balance training;Neuromuscular re-education;Patient/family education;Cognitive remediation    PT Goals (Current goals can be found in the Care Plan section)  Acute Rehab PT Goals Patient Stated Goal: unable to state PT Goal Formulation: With family Time For Goal Achievement: 04/11/20 Potential to Achieve Goals: Fair    Frequency Min 2X/week   Barriers to discharge        Co-evaluation               AM-PAC PT "6 Clicks" Mobility  Outcome Measure Help needed turning from your back to your side while in a flat bed without using bedrails?: A Lot Help needed moving from lying on your back to sitting on the side of a flat bed without using bedrails?: A Lot Help needed moving to and from a bed to a chair (including a wheelchair)?: A Lot Help needed standing up from a chair using your arms (e.g., wheelchair or bedside chair)?: A Lot Help needed to walk in hospital room?: A Lot Help needed climbing 3-5 steps with a railing? : Total 6 Click Score: 11    End of Session Equipment Utilized During Treatment: Gait belt Activity Tolerance: Patient tolerated treatment well Patient left: in bed;with call bell/phone within reach;with bed alarm set;with family/visitor present Nurse Communication: Mobility status PT Visit Diagnosis: Muscle weakness (generalized) (M62.81);Difficulty in walking, not elsewhere classified (R26.2)    Time: 4315-4008 PT Time Calculation (min) (ACUTE ONLY): 26 min   Charges:   PT  Evaluation $PT Re-evaluation: 1 Re-eval PT Treatments $Therapeutic Activity: 23-37 mins        Addylin Manke A. Dan Humphreys PT, DPT Acute Rehabilitation Services Pager [phone] Office [phone]   Viviann Spare 03/28/2020, 4:37 PM

## 2020-03-28 NOTE — Progress Notes (Addendum)
   Subjective:   No acute overnight events.  Patient still unable to hold a conversation, but this appears to be his baseline mental status. Responds with "yes" to most questions.  Objective:  Vital signs in last 24 hours: Vitals:   03/27/20 2200 03/27/20 2251 03/27/20 2252 03/28/20 0500  BP: 103/68 103/68 103/68 (!) 106/54  Pulse: 60 (!) 51 (!) 51 (!) 57  Resp: 18  18 18   Temp: 97.8 F (36.6 C) 97.8 F (36.6 C) 97.8 F (36.6 C) 97.6 F (36.4 C)  TempSrc: Axillary Oral Oral Axillary  SpO2: 98% 98% 98% 100%  Weight:       General: Elderly male, lying in bed, NAD. CV: normal rate and regular rhythm, no m/r/g Pulm: CTABL, no adventitious sounds noted. MSK: no edema noted Neuro: alert and mental status at baseline.   Assessment/Plan:  Principal Problem:   End of life care Active Problems:   Alzheimer's disease (HCC)   Aggressive behavior   Behavioral and psychological symptoms of dementia (HCC)  Nathan Schroeder is a 60 year old male with history of early onset alzheimer's dementia who presented from memory care unit for aggressive behavior/agitation with rapid decline in mental and functional status.  Early onset Alzheimer's dementia with agitation After discussion with family, goals of care plan is to keep patient's code status as DNR, but to optimize medical care in anticipation for long term care facility placement with hospice care. Palliative care on board for ongoing goals of care discussions with family. Social work continuing to help with facility placement. Patient has remained calm for the past day without need for a sitter. -continue home medications -ativan, zofran, haldol, robinul as needed -appreciate palliative care recs -appreciate TOC help with placement  -started on subcutaneous lovenox ppx -PT ordered per family request  Prior to Admission Living Arrangement: Memory care unit Anticipated Discharge Location: Long term care Barriers to Discharge:  Placement Dispo: Anticipated discharge in approximately 2-3 day(s).   77, MD 03/28/2020, 11:34 AM Pager: [phone] After 5pm on weekdays and 1pm on weekends: On Call pager [phone]
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
## 2020-03-29 NOTE — Progress Notes (Signed)
   Subjective:   No acute overnight events.  Patient as per his usual baseline mental status. No new updates. Updated patient's family of current plan via telephone call.  Objective:  Vital signs in last 24 hours: Vitals:   03/28/20 0500 03/28/20 0549 03/28/20 1300 03/28/20 2040  BP: (!) 106/54 (!) 106/54 126/70 124/76  Pulse: (!) 57 (!) 57 64 61  Resp: 18 18 18 18   Temp: 97.6 F (36.4 C) 97.6 F (36.4 C) 97.8 F (36.6 C) 97.8 F (36.6 C)  TempSrc: Axillary Oral    SpO2: 100% 100% 98%   Weight:       Physical Exam: General: Elderly male, lying in bed, NAD. CV: normal rate and regular rhythm, no m/r/g Pulm: CTABL MSK: no edema bilaterally Neuro: alert, mental status at baseline.  Assessment/Plan:  Principal Problem:   End of life care Active Problems:   Alzheimer's disease (HCC)   Aggressive behavior   Behavioral and psychological symptoms of dementia (HCC)  Nathan Schroeder is a 60 year old male with history of early onset alzheimer's dementia who presented from memory care unit for aggressive behavior/agitation with rapid decline in mental and functional status, awaiting long term care placement.  Early onset Alzheimer's dementia with agitation Patient has remained calm over the past few days without needing sitter, even while working with physical therapy. Palliative care following for ongoing goals of care discussions with family. TOC continuing to assist with long term care placement. -continue home medications -appreciate palliative care recs -appreciate TOC assistance -continue PT while here   Prior to Admission Living Arrangement: Memory care unit Anticipated Discharge Location: Long term care Barriers to Discharge: Placement Dispo: Anticipated discharge in approximately 2-3 day(s).   77, MD 03/29/2020, 1:14 PM Pager: [phone] After 5pm on weekdays and 1pm on weekends: On Call pager [phone]

## 2020-03-30 NOTE — TOC Progression Note (Signed)
Transition of Care [facility]) - Progression Note    Patient Details  Name: Nathan Schroeder MRN: [id number] Date of Birth: September 28, 1960  [facility]) CM/SW Contact  Jimmy Picket, [place] Phone Number: 03/30/2020, 4:38 PM  Clinical Narrative:     Pt has no bed offers at this time.   Expected Discharge Plan: Skilled Nursing Facility Barriers to Discharge: Requiring sitter/restraints  Expected Discharge Plan and Services Expected Discharge Plan: Skilled Nursing Facility       Living arrangements for the past 2 months: Assisted Living Facility (Memory Care)                                       Social Determinants of Health (SDOH) Interventions    Readmission Risk Interventions No flowsheet data found.  Jimmy Picket, Theresia Majors, [place] Clinical Social Worker [phone]

## 2020-03-30 NOTE — Progress Notes (Addendum)
   Subjective:   No acute overnight events.  Patient per his baseline mental status. No new updates.  Objective:  Vital signs in last 24 hours: Vitals:   03/28/20 0549 03/28/20 1300 03/28/20 2040 03/30/20 0450  BP: (!) 106/54 126/70 124/76 125/70  Pulse: (!) 57 64 61 65  Resp: 18 18 18 16   Temp: 97.6 F (36.4 C) 97.8 F (36.6 C) 97.8 F (36.6 C) 98.1 F (36.7 C)  TempSrc: Oral   Axillary  SpO2: 100% 98%  99%  Weight:       Physical Exam: General: calm, elderly gentleman lying in bed, NAD. CV: normal rate and regular rhythm, no m/r/g Pulm: CTABL, no adventitious sounds noted. MSK: no edema bilaterally Neuro: alert, mental status at baseline.  Assessment/Plan:  Principal Problem:   End of life care Active Problems:   Alzheimer's disease (HCC)   Aggressive behavior   Behavioral and psychological symptoms of dementia (HCC)  Nathan Schroeder is a 60 year old male with history of early onset alzheimer's dementia who presented from memory care unit for agitation with rapid decline in mental and functional status, awaiting long term care placement.  Early onset Alzheimer's dementia with agitation Patient has remained calm for multiple days now without needing sitter. Palliative care following for ongoing goals of care discussions with family. TOC continuing to assist with long term care placement. He is medically stable for discharge. -continue home medications -appreciate palliative care recs -appreciate TOC assistance -continue PT while here   Prior to Admission Living Arrangement: Memory care unit Anticipated Discharge Location: Long term care Barriers to Discharge: Placement Dispo: Pending LTC placement.   77, MD 03/30/2020, 11:29 AM Pager: [phone] After 5pm on weekdays and 1pm on weekends: On Call pager [phone]

## 2020-03-30 NOTE — Progress Notes (Signed)
Brief Palliative Medicine Progress Note:  PMT shadowing chart for updates regarding disposition. Once disposition confirmed can continue GOC conversations. PMT will touch base with patient and family intermittently to continue offering support.   Thank you for allowing PMT to assist in the care of this patient.  Amber M. Katrinka Blazing [facility] Palliative Medicine Team Team Phone: [phone] NO CHARGE

## 2020-03-31 DIAGNOSIS — R531 Weakness: Secondary | ICD-10-CM

## 2020-03-31 DIAGNOSIS — Z515 Encounter for palliative care: Secondary | ICD-10-CM | POA: Diagnosis not present

## 2020-03-31 DIAGNOSIS — G300 Alzheimer's disease with early onset: Secondary | ICD-10-CM

## 2020-03-31 DIAGNOSIS — R451 Restlessness and agitation: Secondary | ICD-10-CM | POA: Diagnosis not present

## 2020-03-31 DIAGNOSIS — R4689 Other symptoms and signs involving appearance and behavior: Secondary | ICD-10-CM | POA: Diagnosis not present

## 2020-03-31 DIAGNOSIS — F0391 Unspecified dementia with behavioral disturbance: Secondary | ICD-10-CM | POA: Diagnosis not present

## 2020-03-31 NOTE — Progress Notes (Addendum)
   Subjective:   No acute overnight events.  Patient per his baseline mental status. Responding "yes" to most questions. Appears comfortable.  Objective:  Vital signs in last 24 hours: Vitals:   03/28/20 2040 03/30/20 0450 03/30/20 1402 03/31/20 0504  BP: 124/76 125/70 125/70 133/76  Pulse: 61 65 64 61  Resp: 18 16 16 18   Temp: 97.8 F (36.6 C) 98.1 F (36.7 C) 98.4 F (36.9 C) 97.6 F (36.4 C)  TempSrc:  Axillary Axillary Oral  SpO2:  99% 96% 100%  Weight:       Physical Exam: General: calm, elderly gentleman lying in bed, NAD. CV: normal rate and regular rhythm, no m/r/g. Pulm: CTABL, no adventitious sounds noted. MSK: no edema b/l Neuro: alert, mental status at baseline.  Assessment/Plan:  Principal Problem:   End of life care Active Problems:   Alzheimer's disease (HCC)   Aggressive behavior   Behavioral and psychological symptoms of dementia (HCC)  Nathan Schroeder is a 60 year old male with history of early onset alzheimer's dementia who presented from memory care unit for agitation with rapid decline in mental and functional status, awaiting long term care placement.  Early onset Alzheimer's dementia with agitation Patient has remained calm for multiple days now without needing sitter. Palliative care following for ongoing goals of care discussions with family. TOC continuing to assist with long term care placement, but unfortunately still no bed offers yet. He is medically stable for discharge. -continue home medications -appreciate palliative care recs -appreciate TOC assistance -continue PT while here   Prior to Admission Living Arrangement: Memory care unit Anticipated Discharge Location: Long term care Barriers to Discharge: Placement Dispo: Pending LTC placement.   77, MD 03/31/2020, 6:34 AM Pager: [phone] After 5pm on weekdays and 1pm on weekends: On Call pager [phone]

## 2020-03-31 NOTE — Plan of Care (Signed)
  Problem: Health Behavior/Discharge Planning: Goal: Ability to manage health-related needs will improve Outcome: Progressing   
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
## 2020-04-01 NOTE — Progress Notes (Signed)
   Subjective:   Agitated reported overnight.  [name] mental status per baseline. No complaints.  Objective:  Vital signs in last 24 hours: Vitals:   03/31/20 0504 03/31/20 1544 03/31/20 1810 03/31/20 2206  BP: 133/76 121/61 (!) 156/89 (!) 114/49  Pulse: 61 71 74 64  Resp: 18 18 18 17   Temp: 97.6 F (36.4 C) 98 F (36.7 C) 98 F (36.7 C) 98.2 F (36.8 C)  TempSrc: Oral Axillary Axillary Oral  SpO2: 100% 100% 100% 100%  Weight:       Physical Exam: General: calm, elderly gentleman lying in bed, NAD. CV: normal rate and regular rhythm, no m/r/g Pulm: CTABL, no adventitious sounds noted. MSK: no edema b/l Neuro: alert, mental status at baseline.   Assessment/Plan:  Principal Problem:   End of life care Active Problems:   Alzheimer's disease (HCC)   Aggressive behavior   Behavioral and psychological symptoms of dementia (HCC)  Nathan Schroeder is a 60 year old male with history of early onset alzheimer's dementia who presented from memory care unit for agitation with rapid decline in mental and functional status, awaiting long term care placement.  Early onset Alzheimer's dementia with agitation Agitation reported overnight, but patient was calm during my examination this AM. Palliative care following for ongoing goals of care discussions with family. TOC continuing to assist with long term care placement, but unfortunately still no bed offers yet. He is medically stable for discharge. -continue home medications -appreciate palliative care recs -appreciate TOC assistance -continue PT while here   Prior to Admission Living Arrangement: Memory care unit Anticipated Discharge Location: Long term care Barriers to Discharge: Placement Dispo: Pending LTC placement.   77, MD 04/01/2020, 11:00 AM Pager: [phone] After 5pm on weekdays and 1pm on weekends: On Call pager [phone]
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
## 2020-04-02 NOTE — Progress Notes (Signed)
Physical Therapy Treatment Patient Details Name: Nathan Schroeder MRN: [id number] DOB: 02-16-1960 Today's Date: 04/02/2020    History of Present Illness 60 y.o. male admitted on 03/10/20 for dementia with behavioral disturbance.  Pt from [facility].  Pt with significant PMH of HTN, anxiety, Alzheimer's disease.    PT Comments    Pt received supine in bed, pleasant and receptive to therapist upon entry. With attempts to initiate mobility, pt quickly became agitated. He was resisting all mobility and began kicking at therapist. Several different approaches were attempted, but agitation escalated and all mobility attempts ceased.     Follow Up Recommendations  SNF     Equipment Recommendations  Hospital bed;Wheelchair cushion (measurements PT);Wheelchair (measurements PT)    Recommendations for Other Services       Precautions / Restrictions Precautions Precautions: Fall    Mobility  Bed Mobility Overal bed mobility: Needs Assistance Bed Mobility: Supine to Sit;Sit to Supine     Supine to sit: Total assist;+2 for physical assistance Sit to supine: +2 for physical assistance;Max assist   General bed mobility comments: Pt resisting all mobility attempts. Able to get trunk off bed with +2 total assist. Pt became agitated and aggressive, kicking at therapist, requiring immediate return to supine.    Transfers                    Ambulation/Gait                 Stairs             Wheelchair Mobility    Modified Rankin (Stroke Patients Only)       Balance                                            Cognition Arousal/Alertness: Awake/alert Behavior During Therapy: Flat affect;Agitated Overall Cognitive Status: History of cognitive impairments - at baseline                                 General Comments: Advanced dementia at baseline. Did not answer orientation questions. Said "ok" to most  statements/questions. Became agitated and aggressive during therapy attempts.      Exercises      General Comments General comments (skin integrity, edema, etc.): session limited by agitation and aggression. No family present in room.      Pertinent Vitals/Pain Pain Assessment: Faces Faces Pain Scale: Hurts little more Pain Location: R foot when donning sock Pain Descriptors / Indicators: Grimacing;Guarding;Discomfort Pain Intervention(s): Limited activity within patient's tolerance    Home Living                      Prior Function            PT Goals (current goals can now be found in the care plan section) Acute Rehab PT Goals Patient Stated Goal: unable to state Progress towards PT goals: Not progressing toward goals - comment (agitation)    Frequency    Min 2X/week      PT Plan Current plan remains appropriate    Co-evaluation              AM-PAC PT "6 Clicks" Mobility   Outcome Measure  Help needed turning from your back to your side  while in a flat bed without using bedrails?: A Lot Help needed moving from lying on your back to sitting on the side of a flat bed without using bedrails?: A Lot Help needed moving to and from a bed to a chair (including a wheelchair)?: A Lot Help needed standing up from a chair using your arms (e.g., wheelchair or bedside chair)?: A Lot Help needed to walk in hospital room?: A Lot Help needed climbing 3-5 steps with a railing? : Total 6 Click Score: 11    End of Session   Activity Tolerance: Treatment limited secondary to agitation Patient left: in bed;with call bell/phone within reach;with bed alarm set Nurse Communication: Mobility status PT Visit Diagnosis: Muscle weakness (generalized) (M62.81);Difficulty in walking, not elsewhere classified (R26.2)     Time: 1914-7829 PT Time Calculation (min) (ACUTE ONLY): 11 min  Charges:  $Therapeutic Activity: 8-22 mins                     Aida Raider, PT   Office # [phone] Pager [phone]    Ilda Foil 04/02/2020, 9:35 AM

## 2020-04-02 NOTE — Progress Notes (Signed)
   Subjective:   No acute overnight events.  Mental status per baseline.  Objective:  Vital signs in last 24 hours: Vitals:   03/31/20 1810 03/31/20 2206 04/01/20 1540 04/02/20 0642  BP: (!) 156/89 (!) 114/49 128/64 106/66  Pulse: 74 64 72 64  Resp: 18 17 18 17   Temp: 98 F (36.7 C) 98.2 F (36.8 C) 97.9 F (36.6 C) 97.8 F (36.6 C)  TempSrc: Axillary Oral Oral Oral  SpO2: 100% 100% 99% 99%  Weight:       Physical Exam: General: calm, elderly gentleman lying in bed, NAD. CV: normal rate and regular rhythm, no m/r/g. Pulm: CTABL, no adventitious sounds noted. MSK: no edema b/l Neuro: alert, mental status at baseline.   Assessment/Plan:  Principal Problem:   End of life care Active Problems:   Alzheimer's disease (HCC)   Aggressive behavior   Behavioral and psychological symptoms of dementia (HCC)  Nathan Schroeder is a 60 year old male with history of early onset alzheimer's dementia who presented from memory care unit for agitation with rapid decline in mental and functional status, awaiting long term care placement.  Early onset Alzheimer's dementia with agitation Patient calm on exam. Palliative care following for ongoing goals of care discussions with family. TOC continuing to assist with long term care placement, but unfortunately patient does not have any bed offers. TOC to reach out to son to figure out an alternative plan. He has been medically stable for discharge for multiple days now. Spoke with patient's son about using low-dose risperidone for patient's acute agitation and discussed risks/benefits of risperidone. He states that he will talk to patient's geriatrician to get a second opinion prior to considering starting risperidone, which I encouraged. Will reach out to patient's son again tomorrow. -continue home medications -appreciate palliative care recs -appreciate TOC assistance -continue PT while here   Prior to Admission Living Arrangement: Memory  care unit Anticipated Discharge Location: Long term care Barriers to Discharge: Placement Dispo: Pending LTC placement.   77, MD 04/02/2020, 10:46 AM Pager: [phone] After 5pm on weekdays and 1pm on weekends: On Call pager [phone]

## 2020-04-03 NOTE — Progress Notes (Signed)
   Subjective:   No acute overnight events.  [name] mental status per baseline, responding "yes" to most questions.  Objective:  Vital signs in last 24 hours: Vitals:   04/02/20 0642 04/02/20 1514 04/02/20 2120 04/03/20 0456  BP: 106/66 120/74 111/71 99/83  Pulse: 64 87 72 63  Resp: 17 16 17 16   Temp: 97.8 F (36.6 C) 98.9 F (37.2 C) 98.3 F (36.8 C) (!) 97.4 F (36.3 C)  TempSrc: Oral Oral Oral Oral  SpO2: 99% 98% 96% 96%  Weight:       Physical Exam: General: calm, elderly gentleman lying in bed, NAD. Pulmonary: respiratory effort is normal, no respiratory distress. Neuro: alert, mental status at baseline.   Assessment/Plan:  Principal Problem:   End of life care Active Problems:   Alzheimer's disease (HCC)   Aggressive behavior   Behavioral and psychological symptoms of dementia (HCC)  Nathan Schroeder is a 60 year old male with history of early onset alzheimer's dementia who presented from memory care unit for agitation with rapid decline in mental and functional status, now medically stable for discharge and awaiting long term care placement.  Early onset Alzheimer's dementia with agitation Patient remains calm on exam. Palliative care following for ongoing goals of care discussions with family. TOC continuing to assist with long term care placement, unfortunately still no bed offers for patient. TOC to reach out to son to figure out an alternative plan. He has been medically stable for discharge for multiple days now. Spoke with patient's son about using low-dose risperidone for patient's acute agitation and discussed risks/benefits of risperidone. He states that he will talk to patient's geriatrician to get a second opinion before starting risperidone. Patient would benefit from risperidone therapy to better manage his agitation. -continue home medications -appreciate palliative care recs -appreciate TOC assistance -continue PT while here   Prior to Admission  Living Arrangement: Memory care unit Anticipated Discharge Location: Long term care Barriers to Discharge: Placement Dispo: Pending LTC placement.   77, MD 04/03/2020, 9:06 AM Pager: [phone] After 5pm on weekdays and 1pm on weekends: On Call pager [phone]

## 2020-04-04 MED ORDER — DICLOFENAC SODIUM 1 % EX GEL: 4.0000 g | Freq: Two times a day (BID) | CUTANEOUS | Status: DC | PRN

## 2020-04-04 NOTE — Progress Notes (Signed)
   Subjective:   No acute overnight events.  [name] mental status per baseline.  Objective:  Vital signs in last 24 hours: Vitals:   04/02/20 1514 04/02/20 2120 04/03/20 0456 04/04/20 0510  BP: 120/74 111/71 99/83 120/60  Pulse: 87 72 63 64  Resp: 16 17 16 19   Temp: 98.9 F (37.2 C) 98.3 F (36.8 C) (!) 97.4 F (36.3 C) 97.9 F (36.6 C)  TempSrc: Oral Oral Oral Oral  SpO2: 98% 96% 96% 100%  Weight:       Physical Exam: General: calm, elderly gentleman lying in bed, NAD. Pulmonary: respiratory effort is normal, no respiratory distress. Neuro: alert, mental status at baseline.    Assessment/Plan:  Principal Problem:   End of life care Active Problems:   Alzheimer's disease (HCC)   Aggressive behavior   Behavioral and psychological symptoms of dementia (HCC)  Nathan Schroeder is a 60 year old male with history of early onset alzheimer's dementia who presented from memory care unit for agitation with rapid decline in mental and functional status, now medically stable for discharge and awaiting long term care placement.  Early onset Alzheimer's dementia with agitation Still calm during my encounter. Palliative care following for ongoing goals of care discussions with family. TOC continuing to assist with long term care placement, still no bed offers. He has been medically stable for discharge for multiple days now. Spoke with patient's son about using low-dose risperidone for patient's acute agitation and discussed risks/benefits of risperidone. He states that he will talk to patient's geriatrician to get a second opinion before starting risperidone. Patient would benefit from risperidone therapy to better manage his agitation. Will reach out to son again this afternoon. -continue home medications -appreciate palliative care recs -appreciate TOC assistance -continue PT while here   Prior to Admission Living Arrangement: Memory care unit Anticipated Discharge Location:  Long term care Barriers to Discharge: Placement Dispo: Pending LTC placement.   77, MD 04/04/2020, 10:04 AM Pager: [phone] After 5pm on weekdays and 1pm on weekends: On Call pager [phone]

## 2020-04-05 MED ORDER — RISPERIDONE 0.5 MG PO TABS
0.5000 mg | ORAL_TABLET | Freq: Two times a day (BID) | ORAL | Status: DC
  Administered 2020-04-05 – 2020-04-09 (×9): 0.5 mg via ORAL
  Filled 2020-04-05 (×11): qty 1

## 2020-04-05 NOTE — Progress Notes (Signed)
[facility] ([facility])      This patient has been referred for hospice services after discharge.  [facility] will continue to follow for any discharge planning needs and to coordinate admission onto hospice care, as appropriate.   Thank you for the opportunity to participate in this patient's care.     Chrislyn King, BSN, RN [facility] Hospital Liaison [phone]   [phone] (24h on call)     

## 2020-04-05 NOTE — Progress Notes (Signed)
° °  Subjective:   No acute overnight events.  Answers with "yes" to most questions, per baseline.  Objective:  Vital signs in last 24 hours: Vitals:   04/04/20 1356 04/04/20 2032 04/05/20 0427 04/05/20 1235  BP: 117/79 106/65 (!) 119/98 104/76  Pulse: 62 77 (!) 59 83  Resp: 18 17 19 18   Temp:  98.1 F (36.7 C) 97.9 F (36.6 C) 97.8 F (36.6 C)  TempSrc:  Oral Axillary Oral  SpO2: 99% 98% 98% 97%  Weight:       Physical Exam: General: calm, elderly gentleman lying in bed, NAD. Pulm: normal respiratory effort, no respiratory distress. Neuro: alert, mental status at baseline.   Assessment/Plan:  Principal Problem:   End of life care Active Problems:   Alzheimer's disease (HCC)   Aggressive behavior   Behavioral and psychological symptoms of dementia (HCC)  Nathan Schroeder is a 60 year old male with history of early onset alzheimer's dementia who presented from memory care unit for agitation with rapid decline in mental and functional status, now medically stable for discharge and awaiting long term care placement.  Early onset Alzheimer's dementia with agitation Still calm during my encounter. Palliative care following for ongoing goals of care discussions with family. TOC continuing to assist with long term care placement, still no bed offers. He has been medically stable for discharge for multiple days now. Have not been able to contact patient's son, Nathan Schroeder, for past couple of days. Will continue to reach out to family about initiation of risperidone and to keep them engaged in patient's care. -continue home medications -appreciate palliative care recs and TOC assistance -continue PT while here   Prior to Admission Living Arrangement: Memory care unit Anticipated Discharge Location: Long term care Barriers to Discharge: Placement Dispo: Pending LTC placement.   Nathan Senior, MD 04/05/2020, 1:42 PM Pager: [phone] After 5pm on weekdays and 1pm on weekends: On  Call pager [phone]

## 2020-04-05 NOTE — TOC Progression Note (Signed)
Transition of Care [facility]) - Progression Note    Patient Details  Name: DHANUSH JOKERST MRN: [id number] Date of Birth: [birth year]  Transition [facility]) CM/SW Contact  Jimmy Picket, [place] Phone Number: 04/05/2020, 3:59 PM  Clinical Narrative:     CSW reached out to [facility] and [facility] [place] to review the pt. CSW is waiting on reply.   Expected Discharge Plan: Skilled Nursing Facility Barriers to Discharge: Requiring sitter/restraints  Expected Discharge Plan and Services Expected Discharge Plan: Skilled Nursing Facility       Living arrangements for the past 2 months: Assisted Living Facility (Memory Care)                                       Social Determinants of Health (SDOH) Interventions    Readmission Risk Interventions No flowsheet data found.  Jimmy Picket, Theresia Majors, [place] Clinical Social Worker [phone]

## 2020-04-05 NOTE — Progress Notes (Signed)
Physical Therapy Treatment Patient Details Name: Nathan Schroeder MRN: [id number] DOB: 1960/02/14 Today's Date: 04/05/2020    History of Present Illness 60 y.o. male admitted on 03/10/20 for dementia with behavioral disturbance.  Pt from [facility].  Pt with significant PMH of HTN, anxiety, Alzheimer's disease.    PT Comments    Pt resting in bed upon arrival to room. Pt overall requiring max-total assist +2 for bed mobility and transfer to stand this day, no evidence of aggression or agitation with staff. Pt with very limited standing tolerance, ~10 seconds, before LE buckle and pt needs to return to sit. Pt participating well in EOB sitting tasks x10 minutes, PT to continue to follow acutely.    Follow Up Recommendations  SNF     Equipment Recommendations  Hospital bed;Wheelchair cushion (measurements PT);Wheelchair (measurements PT)    Recommendations for Other Services       Precautions / Restrictions Precautions Precautions: Fall Restrictions Weight Bearing Restrictions: No    Mobility  Bed Mobility Overal bed mobility: Needs Assistance Bed Mobility: Supine to Sit;Sit to Supine     Supine to sit: Total assist;+2 for physical assistance;HOB elevated Sit to supine: Total assist;+2 for physical assistance;HOB elevated   General bed mobility comments: total +2 for trunk and LE management, pt unable to sequence task to assist. Boost up in bed with bed pads upon return to supine    Transfers Overall transfer level: Needs assistance Equipment used: Rolling walker (2 wheeled) Transfers: Sit to/from Stand Sit to Stand: Max assist;+2 physical assistance;+2 safety/equipment;From elevated surface         General transfer comment: max +2 for power up, rise, steady, and correction of heavy posterior leaning. Pt with bilateral LE buckling after standing x10 seconds, PT and PT aide assisted pt in return to sit. Second stand attempted, unsuccessful and pt  becoming increasingly restless.  Ambulation/Gait             General Gait Details: unable   Stairs             Wheelchair Mobility    Modified Rankin (Stroke Patients Only)       Balance Overall balance assessment: Needs assistance Sitting-balance support: Feet supported;Bilateral upper extremity supported Sitting balance-Leahy Scale: Fair Sitting balance - Comments: able to sit EOB without PT assist, preference for posterior leaning. EOB sitting x10 minutes   Standing balance support: Bilateral upper extremity supported;During functional activity Standing balance-Leahy Scale: Zero Standing balance comment: max +2                            Cognition Arousal/Alertness: Awake/alert Behavior During Therapy: Flat affect;Restless (Quickly fluctuating to agitation) Overall Cognitive Status: History of cognitive impairments - at baseline Area of Impairment: Orientation;Attention;Memory;Following commands;Safety/judgement;Awareness;Problem solving                 Orientation Level: Disoriented to;Place;Time;Situation Current Attention Level: Focused Memory: Decreased recall of precautions;Decreased short-term memory Following Commands: Follows one step commands inconsistently Safety/Judgement: Decreased awareness of safety;Decreased awareness of deficits Awareness: Intellectual Problem Solving: Slow processing;Decreased initiation;Difficulty sequencing;Requires verbal cues;Requires tactile cues General Comments: Pt with history of advanced dementia. pt awake and watching TV upon arrival to room. pt oriented to self only, responds "okay" with a look of impatience to most questions/statements, does respond "yes" when asked if pt went to [facility] (un[facility] ring donned) and if he wanted to sit up. Pt becomes increasingly restless as session  continues, no overt agitation.      Exercises Other Exercises Other Exercises: x2 LAQ bilaterally at EOB, pt unable  to perform without AA from PT Other Exercises: heel cord and hamstring stretch, supine, x10 seconds bilaterally to pt tolerance. Pt expressing pain with extension LLE.    General Comments        Pertinent Vitals/Pain Pain Assessment: Faces Faces Pain Scale: Hurts little more Pain Location: R foot, during sock donning Pain Descriptors / Indicators: Grimacing;Guarding;Discomfort Pain Intervention(s): Limited activity within patient's tolerance;Monitored during session;Repositioned    Home Living                      Prior Function            PT Goals (current goals can now be found in the care plan section) Acute Rehab PT Goals Patient Stated Goal: unable to state PT Goal Formulation: Patient unable to participate in goal setting Time For Goal Achievement: 04/19/20 Potential to Achieve Goals: Fair Progress towards PT goals: Progressing toward goals    Frequency    Min 2X/week      PT Plan Current plan remains appropriate    Co-evaluation              AM-PAC PT "6 Clicks" Mobility   Outcome Measure  Help needed turning from your back to your side while in a flat bed without using bedrails?: A Lot Help needed moving from lying on your back to sitting on the side of a flat bed without using bedrails?: A Lot Help needed moving to and from a bed to a chair (including a wheelchair)?: A Lot Help needed standing up from a chair using your arms (e.g., wheelchair or bedside chair)?: A Lot Help needed to walk in hospital room?: A Lot Help needed climbing 3-5 steps with a railing? : Total 6 Click Score: 11    End of Session Equipment Utilized During Treatment: Gait belt Activity Tolerance: Patient limited by fatigue Patient left: in bed;with call bell/phone within reach;with bed alarm set Nurse Communication: Mobility status PT Visit Diagnosis: Muscle weakness (generalized) (M62.81);Difficulty in walking, not elsewhere classified (R26.2)     Time:  1610-9604 PT Time Calculation (min) (ACUTE ONLY): 18 min  Charges:  $Therapeutic Activity: 8-22 mins                     Marye Round, PT Acute Rehabilitation Services Pager [phone]  Office [phone]    Nathan Schroeder 04/05/2020, 1:07 PM

## 2020-04-05 NOTE — Progress Notes (Signed)
Called patient son to update him on care and to discuss our recommendation to start Risperidone. Patient was agitated on 3/6 and we are trying to prevent future events. Dr.Jinwala has called son multiple times without response this week and there has been absence of family at bedside. Patients son has not reached out to geriatrician and states he needs to know what medications his father is on. I offered to list medications and also shared it is not routine to have outside providers manage patient under another physicians care. We welcome coordinating care, but I ultimately need the ability to make decistions while patient is under my care.  Patients son cuts off my sentence and says he understand exactly why I am asking. I offered to list all medications for patients son, but again he cut me off during conversation. Patient son was asked to show me the same amount of respect I am showing him. He says he would like the geriatrician who has taken care of his father in the past to review these medications and he would like to talk to the physician he talked to on Monday.  Dr.Jinwala was in our work room and  spoke to patient son. He was provided a medication list and his care was reviewed. Dr.Jinwala offered apologies for any animosity from our team and shared we are working toward the same goal.

## 2020-04-05 NOTE — Progress Notes (Deleted)
Handoff given with nightshift report at 0700, patient to dialysis at 0745.

## 2020-04-05 NOTE — Progress Notes (Signed)
Patient tolerating crushed meds in ice cream well. Daughter at bedside, patient slept well per night shift report.

## 2020-04-06 DIAGNOSIS — F0391 Unspecified dementia with behavioral disturbance: Secondary | ICD-10-CM | POA: Diagnosis not present

## 2020-04-06 DIAGNOSIS — G309 Alzheimer's disease, unspecified: Secondary | ICD-10-CM | POA: Diagnosis not present

## 2020-04-06 DIAGNOSIS — R451 Restlessness and agitation: Secondary | ICD-10-CM | POA: Diagnosis not present

## 2020-04-06 DIAGNOSIS — R4689 Other symptoms and signs involving appearance and behavior: Secondary | ICD-10-CM | POA: Diagnosis not present

## 2020-04-06 NOTE — Progress Notes (Signed)
   Subjective:   No acute overnight events.  Patient per baseline, but slightly more sedated. Still responds with "yes" or "okay" to most questions.  Objective:  Vital signs in last 24 hours: Vitals:   04/05/20 0427 04/05/20 1235 04/05/20 2053 04/06/20 0518  BP: (!) 119/98 104/76 125/63 (!) 105/59  Pulse: (!) 59 83 71 62  Resp: 19 18 17 17   Temp: 97.9 F (36.6 C) 97.8 F (36.6 C) 98.1 F (36.7 C) 97.8 F (36.6 C)  TempSrc: Axillary Oral    SpO2: 98% 97% 99% 100%  Weight:       Physical Exam: General: calm, elderly gentleman lying in bed, NAD. Pulm: normal respiratory effort, no respiratory distress. Neuro: alert, but slightly more sedated, mental status at baseline when aroused.   Assessment/Plan:  Principal Problem:   End of life care Active Problems:   Alzheimer's disease (HCC)   Aggressive behavior   Behavioral and psychological symptoms of dementia (HCC)  Nathan Schroeder is a 60 year old male with history of early onset alzheimer's dementia who presented from memory care unit for agitation with rapid decline in mental and functional status. He is medically stable for discharge and awaiting long term care placement.  Early onset Alzheimer's dementia with agitation Palliative care following for ongoing goals of care discussions with family. TOC continuing to assist with long term care placement, still awaiting a bed offer. Medically stable for discharge. Added risperidone 0.5mg  BID to prevent agitation after discussing with patient's son, Nathan Schroeder. -continue home medications -appreciate palliative care recs and TOC assistance -continue PT while here   Prior to Admission Living Arrangement: Memory care unit Anticipated Discharge Location: Long term care Barriers to Discharge: Placement Dispo: Pending LTC placement.   Nathan Senior, MD 04/06/2020, 11:57 AM Pager: [phone] After 5pm on weekdays and 1pm on weekends: On Call pager [phone]
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
## 2020-04-07 NOTE — TOC Progression Note (Signed)
Transition of Care [facility]) - Progression Note    Patient Details  Name: Nathan Schroeder MRN: [id number] Date of Birth: 06-12-1960  Transition of Care [facility]) CM/SW Contact  Baldemar Lenis, [place] Phone Number: 04/07/2020, 11:12 AM  Clinical Narrative:   CSW following for SNF placement. [facility] has declined patient. [facility] referral still pending. CSW attempted to contact them to ask about progress in reviewing referral, no answer. CSW to follow.    Expected Discharge Plan: Skilled Nursing Facility Barriers to Discharge: Requiring sitter/restraints  Expected Discharge Plan and Services Expected Discharge Plan: Skilled Nursing Facility       Living arrangements for the past 2 months: Assisted Living Facility (Memory Care)                                       Social Determinants of Health (SDOH) Interventions    Readmission Risk Interventions No flowsheet data found.

## 2020-04-07 NOTE — Progress Notes (Signed)
   Subjective:  No acute events overnight  Patient sleeping on approach.  We turned on light and woke patient up. He appears comfortable and responds inappropriately "yes" to all questions.   Objective:  Vital signs in last 24 hours: Vitals:   04/06/20 0518 04/06/20 1329 04/06/20 2006 04/07/20 0417  BP: (!) 105/59 104/68 99/84 (!) 107/55  Pulse: 62 68 82 64  Resp: 17  18 18   Temp: 97.8 F (36.6 C) 98.3 F (36.8 C) 98.2 F (36.8 C) 97.7 F (36.5 C)  TempSrc:   Oral Oral  SpO2: 100% 95% 97% 98%  Weight:       General: Calm, no acute distress Pulm: Normal work of breathing ,no respiratory distress Neuro: Arousable, attentive , responds yes to all questions - (ex.what is your name)  Assessment/Plan:  Principal Problem:   End of life care Active Problems:   Alzheimer's disease (HCC)   Aggressive behavior   Behavioral and psychological symptoms of dementia [facility])  Hospital day #28 for Nathan Schroeder a 60 year old person with early onset Alzheimer's dementia who presented from memory care unit for agitation with rapid decline in mental and functional status.  We continued home medications and have added risperidone 0.5 mg twice daily to prevent further agitation.  Appreciate of palliative care who has been following and also updating family.  Social worker has reached out to [facility] and [facility] per note 2 days ago.  Waiting on placement.   Prior to Admission Living Arrangement: Memory care unit Anticipated Discharge Location: Long-term care Barriers to Discharge: Placement Dispo: Pending LTC placement  [place], MD 04/07/2020, 11:11 AM Pager: [phone] After 5pm on weekdays and 1pm on weekends: On Call pager [phone]

## 2020-04-08 DIAGNOSIS — F0281 Dementia in other diseases classified elsewhere with behavioral disturbance: Secondary | ICD-10-CM

## 2020-04-08 LAB — BASIC METABOLIC PANEL
Anion gap: 9 (ref 5–15)
BUN: 17 mg/dL (ref 8–23)
CO2: 26 mmol/L (ref 22–32)
Calcium: 9.1 mg/dL (ref 8.9–10.3)
Chloride: 103 mmol/L (ref 98–111)
Creatinine, Ser: 0.94 mg/dL (ref 0.61–1.24)
GFR, Estimated: >60 mL/min (ref >60)
Glucose, Bld: 126 mg/dL — ABNORMAL HIGH (ref 70–99)
Potassium: 4.1 mmol/L (ref 3.5–5.1)
Sodium: 138 mmol/L (ref 135–145)

## 2020-04-08 MED ORDER — CHLORHEXIDINE GLUCONATE CLOTH 2 % EX PADS
6.0000 | MEDICATED_PAD | Freq: Every day | CUTANEOUS | Status: DC
  Administered 2020-04-08 – 2020-05-01 (×22): 6 via TOPICAL

## 2020-04-08 NOTE — TOC Progression Note (Signed)
Transition of Care [facility]) - Progression Note    Patient Details  Name: Nathan Schroeder MRN: [id number] Date of Birth: 01-07-1961  Transition of Care [facility]) CM/SW Contact  Terrial Rhodes, LCSWA Phone Number: 04/08/2020, 4:55 PM  Clinical Narrative:     CSW following for SNF placement. CSW awaiting callback from [facility]. Referral sent is pending. CSW will follow up with [facility] in the morning to see if they can make SNF bed offer for patient.  CSW will continue to follow.   Expected Discharge Plan: Skilled Nursing Facility Barriers to Discharge: Requiring sitter/restraints  Expected Discharge Plan and Services Expected Discharge Plan: Skilled Nursing Facility       Living arrangements for the past 2 months: Assisted Living Facility (Memory Care)                                       Social Determinants of Health (SDOH) Interventions    Readmission Risk Interventions No flowsheet data found.

## 2020-04-08 NOTE — Progress Notes (Signed)
Pt had very little urine output throught the night. Bladder scan shows .  Notified MD on call. In and out ordered.  RN got 1100 ml urine via catheterization.

## 2020-04-08 NOTE — Progress Notes (Addendum)
   Subjective:   Had ~792mL urine on bladder scan this AM, performed in and out cath with ~1180mL output.  Sleeping comfortably, but easily arousable. Responds "yes" or "okay" to all questions.  Objective:  Vital signs in last 24 hours: Vitals:   04/06/20 2006 04/07/20 0417 04/07/20 1418 04/07/20 2021  BP: 99/84 (!) 107/55 101/66 128/76  Pulse: 82 64 83 71  Resp: 18 18 18 17   Temp: 98.2 F (36.8 C) 97.7 F (36.5 C) 97.8 F (36.6 C) 98.8 F (37.1 C)  TempSrc: Oral Oral Axillary Oral  SpO2: 97% 98% 94% 99%  Weight:        General: Calm, no acute distress. Pulm: Normal respiratory effort, no respiratory distress. Neuro: Arousable, mental status at baseline.  Assessment/Plan:  Principal Problem:   End of life care Active Problems:   Alzheimer's disease (HCC)   Aggressive behavior   Behavioral and psychological symptoms of dementia [facility])  Hospital day #29 for Nathan Schroeder a 60 year old person with early onset Alzheimer's dementia who presented from memory care unit for agitation with rapid decline in mental and functional status. We continued home medications and have added risperidone 0.5 mg twice daily to prevent further agitation. Appreciate palliative care who has been following and also updating family. [facility] referral still pending, appreciate TOC assistance. Waiting on placement.    Urinary Retention Had ~723mL urine on bladder scan, with ~1141mL output with in and out cath. Will perform q4h bladder scans to check for ongoing retention. If he continues to retain, may need foley placement.    Prior to Admission Living Arrangement: Memory care unit Anticipated Discharge Location: Long-term care Barriers to Discharge: Placement Dispo: Pending LTC placement  80m, MD 04/08/2020, 6:45 AM Pager: [phone] After 5pm on weekdays and 1pm on weekends: On Call pager [phone]

## 2020-04-09 MED ORDER — ADULT MULTIVITAMIN W/MINERALS CH
1.0000 | ORAL_TABLET | Freq: Every day | ORAL | Status: DC
  Administered 2020-04-09 – 2020-04-24 (×13): 1 via ORAL
  Filled 2020-04-09 (×13): qty 1

## 2020-04-09 MED ORDER — ENSURE ENLIVE PO LIQD
237.0000 mL | Freq: Three times a day (TID) | ORAL | Status: DC
  Administered 2020-04-09 – 2020-05-18 (×76): 237 mL via ORAL
  Administered 2020-05-18: 50 mL via ORAL
  Administered 2020-05-19: 237 mL via ORAL
  Administered 2020-05-19: 50 mL via ORAL
  Administered 2020-05-20 – 2020-05-28 (×17): 237 mL via ORAL
  Filled 2020-04-09 (×2): qty 237

## 2020-04-09 NOTE — Progress Notes (Signed)
Initial Nutrition Assessment  DOCUMENTATION CODES:   Obesity unspecified  INTERVENTION:   -MVI with minerals daily -Ensure Enlive po TID, each supplement provides 350 kcal and 20 grams of protein -Feeding assistance with meals -Modify diet to finger foods per pt preference  NUTRITION DIAGNOSIS:   Inadequate oral intake related to decreased appetite as evidenced by meal completion < 50%.  GOAL:   Patient will meet greater than or equal to 90% of their needs  MONITOR:   PO intake,Supplement acceptance,Labs,Weight trends,Skin,I & O's  REASON FOR ASSESSMENT:   Consult Assessment of nutrition requirement/status  ASSESSMENT:   Mr. Baby is a 60 y/o male with a history of early onset alzheimer's disease presenting from memory care unit for aggressive behavior with rapid decline in he mental and functional status.  Pt admitted with alzheimer's dementia with aggressive behavior.   2/25- transitioned to comfort care 2/28- comfort care rescinded  Reviewed I/O's: -269 ml x 24 hours and -2.6 L since 03/26/20  UOP: 1.3 L x 24 hours  Pt unavailable at time of visit. Attempted to speak with pt and family via phone, however, unable to reach. Per chart review, pt unable to provide any meaningful history.   Pt with poor oral intake. Noted meal completion 0-50%. Per palliative care notes, pt often does not accept help of feeding assistance. He does eat some food when brought in and served by his family. He prefers finger foods.   Reviewed wt hx; pt has experienced a 13.3% wt loss over the past 6 months, which is significant for time frame.   Highly suspect pt with malnutrition given weight loss, decreased oral intake, and multiple co-morbidities, however, unable to identify at this time.   Albumin has a half-life of 21 days and is strongly affected by stress response and inflammatory process, therefore, do not expect to see an improvement in this lab value during acute hospitalization.  When a patient presents with low albumin, it is likely skewed due to the acute inflammatory response. Note that low albumin is no longer used to diagnose malnutrition; [place] uses the new malnutrition guidelines published by the American Society for Parenteral and Enteral Nutrition (A.S.P.E.N.) and the Academy of Nutrition and Dietetics (AND).   Per TOC notes, pt awaiting placement at this time.    Labs reviewed: CBGS: 117.  Diet Order:   Diet Order            Diet regular Room service appropriate? Yes; Fluid consistency: Thin  Diet effective now                 EDUCATION NEEDS:   No education needs have been identified at this time  Skin:  Skin Assessment: Reviewed RN Assessment  Last BM:  04/08/20  Height:   Ht Readings from Last 1 Encounters:  09/22/19 6' (1.829 m)    Weight:   Wt Readings from Last 1 Encounters:  03/17/20 124.9 kg    Ideal Body Weight:  80.9 kg  BMI:  Body mass index is 37.34 kg/m.  Estimated Nutritional Needs:   Kcal:  2200-2400  Protein:  120-135 grams  Fluid:  > 2 L    Levada Schilling, RD, LDN, CDCES Registered Dietitian II Certified Diabetes Care and Education Specialist Please refer to [facility] for RD and/or RD on-call/weekend/after hours pager

## 2020-04-09 NOTE — Progress Notes (Addendum)
   Subjective:   Foley catheter placed overnight due to urinary retention.  Sleeping comfortably, but does respond to voice with "yes" or "okay" as per baseline.  Objective:  Vital signs in last 24 hours: Vitals:   04/08/20 0600 04/08/20 1508 04/08/20 2015 04/09/20 0441  BP: 120/75 110/64 (!) 110/99 103/69  Pulse: 75 84 68 82  Resp:  18 17 18   Temp: 98.8 F (37.1 C) 97.8 F (36.6 C) 98.3 F (36.8 C) 98.8 F (37.1 C)  TempSrc: Axillary Axillary Oral Oral  SpO2: 99% 98% (!) 76% 97%  Weight:       General: calm, elderly gentleman, NAD. Pulm: normal respiratory effort, no respiratory distress. GU: foley catheter in place. Neuro: mental status at baseline.  Assessment/Plan:  Principal Problem:   End of life care Active Problems:   Alzheimer's disease (HCC)   Aggressive behavior   Behavioral and psychological symptoms of dementia [name]  Hospital day #30 for Nathan Schroeder a 60 year old person with early onset Alzheimer's dementia who presented from memory care unit for agitation with rapid decline in mental and functional status. We continued home medications and have added risperidone 0.5 mg twice daily to prevent further agitation. Appreciate palliative care who has been following and also updating family. [name] referral still pending, appreciate TOC assistance. -registered dietician to adjust diet to maximize supplementation while here   Urinary Retention Likely bladder stretch injury, given ~1188mL seen on bladder scan yesterday. Foley catheter in place for decompression, will need it while bladder heals.   Prior to Admission Living Arrangement: Memory care unit Anticipated Discharge Location: Long-term care Barriers to Discharge: Placement Dispo: Pending LTC placement  80m, MD 04/09/2020, 11:33 AM Pager: [phone] After 5pm on weekdays and 1pm on weekends: On Call pager [phone]

## 2020-04-09 NOTE — Progress Notes (Signed)
PT Cancellation Note  Patient Details Name: JACKSTON OAXACA MRN: [id number] DOB: 11-07-1960   Cancelled Treatment:    Reason Eval/Treat Not Completed: Other (comment).  Pt is quite asleep and cannot awaken to work with him.  Follow up as time and pt allow.   Ivar Drape 04/09/2020, 4:08 PM Samul Dada, PT MS Acute Rehab Dept. Number: [facility] [id number] and [facility] [phone]

## 2020-04-10 MED ORDER — RISPERIDONE 0.5 MG PO TABS
0.5000 mg | ORAL_TABLET | Freq: Every day | ORAL | Status: DC
  Administered 2020-04-10: 0.5 mg via ORAL
  Filled 2020-04-10 (×2): qty 1

## 2020-04-10 NOTE — Progress Notes (Signed)
   Subjective:   No acute overnight events.  Sleeping comfortably, does respond to voice with "yes" or "okay".  Objective:  Vital signs in last 24 hours: Vitals:   04/09/20 0441 04/09/20 1416 04/09/20 2132 04/10/20 0334  BP: 103/69 104/73 (!) 133/58 (!) 126/56  Pulse: 82 78 78 82  Resp: 18 18 18 20   Temp: 98.8 F (37.1 C) (!) 97.5 F (36.4 C) 99 F (37.2 C) 98 F (36.7 C)  TempSrc: Oral Oral Axillary Axillary  SpO2: 97% 96% 98% 99%  Weight:       General: elderly gentleman, sedated, NAD. Pulm: normal respiratory effort, no respiratory distress. Neuro: sedated, but arousable to voice.  Assessment/Plan:  Principal Problem:   End of life care Active Problems:   Alzheimer's disease (HCC)   Aggressive behavior   Behavioral and psychological symptoms of dementia [facility])  Hospital day #31 for Nathan Schroeder a 60 year old person with early onset Alzheimer's dementia who presented from memory care unit for agitation with rapid decline in mental and functional status. We continued home medications. Will decrease risperidone to 0.5mg  qhs as he appears slightly overly sedated with BID dosing. Appreciate palliative care who has been following and also updating family. [facility] referral still pending, appreciate TOC assistance.  Urinary Retention Likely bladder stretch injury. Foley catheter in place for decompression, will need it while bladder heals.   Prior to Admission Living Arrangement: Memory care unit Anticipated Discharge Location: Long-term care Barriers to Discharge: Placement Dispo: Pending LTC placement  [facility], MD 04/10/2020, 9:21 AM Pager: [phone] After 5pm on weekdays and 1pm on weekends: On Call pager [phone]

## 2020-04-10 NOTE — Progress Notes (Signed)
Physical Therapy Treatment Patient Details Name: Nathan Schroeder MRN: [id number] DOB: 12-Jan-1961 Today's Date: 04/10/2020    History of Present Illness 60 y.o. male admitted on 03/10/20 for dementia with behavioral disturbance.  Pt from [facility].  Pt with significant PMH of HTN, anxiety, Alzheimer's disease.    PT Comments    Pt was seen for mobility but did resist being moved to try to sit up on bed.  Pt was assisted with ROM to LE's and again resists the efforts.  Pt was repositioned to be more comfortable as he is apparently hurting on R shoulder.  Follow for acute therapy goals, and continue to work on trying to get up to chair as tolerated.   Follow Up Recommendations  SNF     Equipment Recommendations  Hospital bed;Wheelchair cushion (measurements PT);Wheelchair (measurements PT)    Recommendations for Other Services OT consult     Precautions / Restrictions Precautions Precautions: Fall Precaution Comments: monitor HR and sats Restrictions Weight Bearing Restrictions: No    Mobility  Bed Mobility Overal bed mobility: Needs Assistance             General bed mobility comments: assisted to center on bed with pt resisting, and assisted to prop on pillow R shoulder to maintain midline    Transfers                    Ambulation/Gait                 Stairs             Wheelchair Mobility    Modified Rankin (Stroke Patients Only)       Balance                                            Cognition Arousal/Alertness: Awake/alert Behavior During Therapy: Flat affect;Restless Overall Cognitive Status: History of cognitive impairments - at baseline Area of Impairment: Orientation;Attention;Memory;Following commands                 Orientation Level: Place;Time;Situation Current Attention Level: Selective Memory: Decreased recall of precautions;Decreased short-term memory Following  Commands: Follows one step commands inconsistently;Follows one step commands with increased time (does not follow commands well)              Exercises General Exercises - Lower Extremity Ankle Circles/Pumps: AAROM;5 reps Heel Slides: AAROM;10 reps Hip ABduction/ADduction: AAROM;10 reps Hip Flexion/Marching: AAROM;10 reps    General Comments General comments (skin integrity, edema, etc.): pt is assisted to move on bed and attempted ROM to LE's with pt resisting      Pertinent Vitals/Pain Pain Assessment: Faces Faces Pain Scale: Hurts little more Pain Location: R shoulder with any movement of R side Pain Descriptors / Indicators: Grimacing;Guarding;Discomfort Pain Intervention(s): Limited activity within patient's tolerance;Monitored during session;Repositioned    Home Living                      Prior Function            PT Goals (current goals can now be found in the care plan section) Acute Rehab PT Goals Patient Stated Goal: none stated Potential to Achieve Goals: Fair Progress towards PT goals: Not progressing toward goals - comment    Frequency    Min 2X/week      PT  Plan Current plan remains appropriate    Co-evaluation              AM-PAC PT "6 Clicks" Mobility   Outcome Measure  Help needed turning from your back to your side while in a flat bed without using bedrails?: A Lot Help needed moving from lying on your back to sitting on the side of a flat bed without using bedrails?: A Lot Help needed moving to and from a bed to a chair (including a wheelchair)?: A Lot Help needed standing up from a chair using your arms (e.g., wheelchair or bedside chair)?: A Lot Help needed to walk in hospital room?: A Lot Help needed climbing 3-5 steps with a railing? : Total 6 Click Score: 11    End of Session   Activity Tolerance: Treatment limited secondary to agitation;Patient limited by fatigue Patient left: in bed;with call bell/phone within  reach;with bed alarm set Nurse Communication: Mobility status PT Visit Diagnosis: Muscle weakness (generalized) (M62.81);Difficulty in walking, not elsewhere classified (R26.2)     Time: 1423-9532 PT Time Calculation (min) (ACUTE ONLY): 20 min  Charges:  $Therapeutic Exercise: 8-22 mins                    Ivar Drape 04/10/2020, 7:36 PM  Samul Dada, PT MS Acute Rehab Dept. Number: [facility] [id number] and [facility] [phone]

## 2020-04-11 DIAGNOSIS — R451 Restlessness and agitation: Secondary | ICD-10-CM | POA: Diagnosis not present

## 2020-04-11 DIAGNOSIS — R4689 Other symptoms and signs involving appearance and behavior: Secondary | ICD-10-CM | POA: Diagnosis not present

## 2020-04-11 DIAGNOSIS — F0391 Unspecified dementia with behavioral disturbance: Secondary | ICD-10-CM | POA: Diagnosis not present

## 2020-04-11 DIAGNOSIS — Z515 Encounter for palliative care: Secondary | ICD-10-CM | POA: Diagnosis not present

## 2020-04-11 NOTE — Progress Notes (Signed)
   Subjective:   No acute overnight events.  Sleeping comfortably, but easily arousable. Responds with "yes" and "okay" to all questions.  Objective:  Vital signs in last 24 hours: Vitals:   04/10/20 0334 04/10/20 1411 04/10/20 2115 04/11/20 0352  BP: (!) 126/56 (!) 96/50 (!) 107/57 112/68  Pulse: 82 71 75 82  Resp: 20 16 16 17   Temp: 98 F (36.7 C) 98.4 F (36.9 C) 98.7 F (37.1 C) 97.6 F (36.4 C)  TempSrc: Axillary Axillary Oral Axillary  SpO2: 99% 99% 100% 99%  Weight:       General: elderly gentleman, sleeping on approach, NAD. Pulm: normal work of breathing, no respiratory distress. Neuro: alert when aroused, mental status per baseline.  Assessment/Plan:  Principal Problem:   End of life care Active Problems:   Alzheimer's disease (HCC)   Aggressive behavior   Behavioral and psychological symptoms of dementia [facility])  Hospital day #32 for Westin Knotts a 60 year old person with early onset Alzheimer's dementia who presented from memory care unit for agitation with rapid decline in mental and functional status. We continued home medications. Appreciate palliative care who has been following and also updating family. [facility] referral still pending, appreciate TOC assistance. -removed risperidone from medications to prevent interactions with other atypical antidepressant (seroquel) -will need to consider titrating down wellbutrin as it has stimulatory effects -will have patient continue to try and work with PT as tolerable while inpatient  Urinary Retention Likely bladder stretch injury. Foley catheter in place for decompression, will need it temporarily while bladder heals.   Prior to Admission Living Arrangement: Memory care unit Anticipated Discharge Location: Long-term care Barriers to Discharge: Placement Dispo: Pending LTC placement  [facility], MD 04/11/2020, 9:08 AM Pager: [phone] After 5pm on weekdays and 1pm on weekends: On Call pager  [phone]
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
## 2020-04-12 LAB — COMPREHENSIVE METABOLIC PANEL
ALT: 28 U/L (ref 0–44)
AST: 24 U/L (ref 15–41)
Albumin: 2.9 g/dL — ABNORMAL LOW (ref 3.5–5.0)
Alkaline Phosphatase: 65 U/L (ref 38–126)
Anion gap: 9 (ref 5–15)
BUN: 13 mg/dL (ref 8–23)
CO2: 23 mmol/L (ref 22–32)
Calcium: 8.7 mg/dL — ABNORMAL LOW (ref 8.9–10.3)
Chloride: 104 mmol/L (ref 98–111)
Creatinine, Ser: 0.73 mg/dL (ref 0.61–1.24)
GFR, Estimated: >60 mL/min (ref >60)
Glucose, Bld: 96 mg/dL (ref 70–99)
Potassium: 3.9 mmol/L (ref 3.5–5.1)
Sodium: 136 mmol/L (ref 135–145)
Total Bilirubin: 0.6 mg/dL (ref 0.3–1.2)
Total Protein: 7.1 g/dL (ref 6.5–8.1)

## 2020-04-12 NOTE — Progress Notes (Signed)
[facility] ([facility])      This patient has been referred for hospice services after discharge.  [facility] will continue to follow for any discharge planning needs and to coordinate admission onto hospice care, as appropriate.   Thank you for the opportunity to participate in this patient's care.     Chrislyn King, BSN, RN [facility] Hospital Liaison [phone]   [phone] (24h on call)     

## 2020-04-12 NOTE — Progress Notes (Signed)
Patient given meds crushed with applesauce, compliant with taking a few bites; unable to administer the remainder. CHG bath given.

## 2020-04-12 NOTE — Progress Notes (Signed)
° °  Subjective:   No acute overnight events.  He is a lot more interactive today. Able to say "yes, that's fine" which is improved from prior interactions. He is still very calm today even during repositioning. He is too big for his current bed so will try to get larger bed for him to be more comfortable.  Objective:  Vital signs in last 24 hours: Vitals:   04/11/20 0352 04/11/20 1257 04/11/20 2115 04/12/20 0650  BP: 112/68 129/68 119/66 99/64  Pulse: 82 70 69 66  Resp: 17 20 17 16   Temp: 97.6 F (36.4 C) 98.6 F (37 C) 98.2 F (36.8 C) 98 F (36.7 C)  TempSrc: Axillary  Axillary Axillary  SpO2: 99% 97% 98% 98%  Weight:       General: elderly gentleman, lying in bed, NAD. Pulm: normal work of breathing, no respiratory distress. Neuro: alert, mental status slightly improved from prior encounters.  Assessment/Plan:  Principal Problem:   End of life care Active Problems:   Alzheimer's disease (HCC)   Aggressive behavior   Behavioral and psychological symptoms of dementia [facility]  Hospital day #33 for Nathan Schroeder a 60 year old person with early onset Alzheimer's dementia who presented from memory care unit for agitation with rapid decline in mental and functional status. We continued home medications. Appreciate palliative care who has been following and also updating family. [facility] referral still pending, appreciate TOC assistance. -off of risperidone -will need to consider titrating down wellbutrin at some point as it has stimulatory effects >> will need to discuss this with family prior to making changes.  Urinary Retention Likely bladder stretch injury. Foley catheter in place for decompression, will need it temporarily while bladder heals.   Prior to Admission Living Arrangement: Memory care unit Anticipated Discharge Location: Long-term care Barriers to Discharge: Placement Dispo: Pending LTC placement  [facility], MD 04/12/2020, 10:47 AM Pager:  [phone] After 5pm on weekdays and 1pm on weekends: On Call pager [phone]

## 2020-04-12 NOTE — NC FL2 (Signed)
[place] MEDICAID FL2 LEVEL OF CARE SCREENING TOOL     IDENTIFICATION  Patient Name: Nathan Schroeder Birthdate: 10/14/1960 Sex: male Admission Date (Current Location): 03/10/2020  [facility] and [place] Number:  Producer, television/film/video and Address:  [facility] [place], [address] [place], [place] [address]      Provider Number: [id number]  Attending Physician Name and Address:  Miguel Aschoff, MD  Relative Name and Phone Number:  Nhat, Hearne   [phone]    Current Level of Care: Hospital Recommended Level of Care: Skilled Nursing Facility Prior Approval Number:    Date Approved/Denied:   PASRR Number: [id number]  Discharge Plan: SNF    Current Diagnoses: Patient Active Problem List   Diagnosis Date Noted  . End of life care 03/23/2020  . Alzheimer's disease (HCC)   . Aggressive behavior   . Behavioral and psychological symptoms of dementia (HCC)   . Ventral hernia without obstruction or gangrene 04/13/2019  . Tinea pedis of both feet 04/13/2019  . Iron excess 04/13/2019  . Hyperlipidemia LDL goal <100 04/13/2019  . Localized swelling of both lower legs 04/13/2019  . BMI 40.0-44.9, adult (HCC) 04/13/2019  . Frequent urination 04/13/2019  . Mild cognitive impairment with memory loss 02/03/2013  . Hypertension 07/19/2012  . Memory loss 07/19/2012    Orientation RESPIRATION BLADDER Height & Weight     Self  Normal Incontinent Weight: 275 lb 5.7 oz (124.9 kg) Height:     BEHAVIORAL SYMPTOMS/MOOD NEUROLOGICAL BOWEL NUTRITION STATUS  Other (Comment) (Due to Alzheimer's can swat if approached in a "threatening way.")   Incontinent Diet (Regular)  AMBULATORY STATUS COMMUNICATION OF NEEDS Skin   Limited Assist Verbally Normal                       Personal Care Assistance Level of Assistance  Bathing,Feeding,Dressing Bathing Assistance: Maximum assistance Feeding assistance: Maximum assistance Dressing Assistance: Maximum  assistance     Functional Limitations Info  Speech,Hearing,Sight Sight Info: Adequate Hearing Info: Adequate Speech Info: Adequate    SPECIAL CARE FACTORS FREQUENCY  PT (By licensed PT),OT (By licensed OT)     PT Frequency: 5 x weekly OT Frequency: 5x weekly            Contractures Contractures Info: Not present    Additional Factors Info  Code Status (DNR) Code Status Info: DNR             Current Medications (04/12/2020):  This is the current hospital active medication list Current Facility-Administered Medications  Medication Dose Route Frequency Provider Last Rate Last Admin  . acetaminophen (TYLENOL) tablet 650 mg  650 mg Oral Q6H PRN Rehman, Areeg N, DO       Or  . acetaminophen (TYLENOL) suppository 650 mg  650 mg Rectal Q6H PRN Rehman, Areeg N, DO      . ALPRAZolam (XANAX) tablet 0.5 mg  0.5 mg Oral BID Rehman, Areeg N, DO   0.5 mg at 04/12/20 0959  . antiseptic oral rinse (BIOTENE) solution 15 mL  15 mL Topical PRN Rehman, Areeg N, DO      . buPROPion (WELLBUTRIN XL) 24 hr tablet 300 mg  300 mg Oral Daily Rehman, Areeg N, DO   300 mg at 04/12/20 0959  . busPIRone (BUSPAR) tablet 15 mg  15 mg Oral TID PRN Rehman, Areeg N, DO   15 mg at 03/30/20 0656  . Chlorhexidine Gluconate Cloth 2 % PADS  6 each  6 each Topical Daily Tyson Alias, MD   6 each at 04/12/20 1031  . divalproex (DEPAKOTE) DR tablet 250 mg  250 mg Oral TID Rehman, Areeg N, DO   250 mg at 04/12/20 0959  . donepezil (ARICEPT) tablet 10 mg  10 mg Oral QHS Rehman, Areeg N, DO   10 mg at 04/11/20 2138  . enoxaparin (LOVENOX) injection 40 mg  40 mg Subcutaneous Q24H Merrilyn Puma, MD   40 mg at 04/12/20 1000  . feeding supplement (ENSURE ENLIVE / ENSURE PLUS) liquid 237 mL  237 mL Oral TID BM Tyson Alias, MD   237 mL at 04/11/20 2135  . memantine (NAMENDA) tablet 10 mg  10 mg Oral BID Rehman, Areeg N, DO   10 mg at 04/12/20 0958  . multivitamin with minerals tablet 1 tablet  1 tablet  Oral Daily Tyson Alias, MD   1 tablet at 04/12/20 0959  . ondansetron (ZOFRAN-ODT) disintegrating tablet 4 mg  4 mg Oral Q6H PRN Rehman, Areeg N, DO       Or  . ondansetron (ZOFRAN) injection 4 mg  4 mg Intravenous Q6H PRN Rehman, Areeg N, DO      . polyvinyl alcohol (LIQUIFILM TEARS) 1.4 % ophthalmic solution 1 drop  1 drop Both Eyes QID PRN Rehman, Areeg N, DO      . QUEtiapine (SEROQUEL) tablet 100 mg  100 mg Oral BID Rehman, Areeg N, DO   100 mg at 04/12/20 0959  . sertraline (ZOLOFT) tablet 100 mg  100 mg Oral Daily Rehman, Areeg N, DO   100 mg at 04/12/20 1032     Discharge Medications: Please see discharge summary for a list of discharge medications.  Relevant Imaging Results:  Relevant Lab Results:   Additional Information Patient family likely to agree to SNF with Hospice or Palliative following   Macario Golds, LCSW [phone]

## 2020-04-12 NOTE — Progress Notes (Signed)
Physical Therapy Treatment Patient Details Name: Nathan Schroeder MRN: [id number] DOB: 1960-08-06 Today's Date: 04/12/2020    History of Present Illness 60 y.o. male admitted on 03/10/20 for dementia with behavioral disturbance.  Pt from [facility].  Pt with significant PMH of HTN, anxiety, Alzheimer's disease.    PT Comments    Pt was seen for mobility on RW with attempt to stand, then finally directly assisted him to stand up and pivot to chair with 2 max to total assist.  He is reluctant, but finally in chair looked more settled.  Worked with CNA to get lift pad under him for return to bed since pt is unpredictable currently to move.  Follow acutely for progression of all mobility.   Follow Up Recommendations  SNF     Equipment Recommendations  Hospital bed;Wheelchair cushion (measurements PT);Wheelchair (measurements PT)    Recommendations for Other Services       Precautions / Restrictions Precautions Precautions: Fall Precaution Comments: monitor HR and sats Restrictions Weight Bearing Restrictions: No    Mobility  Bed Mobility Overal bed mobility: Needs Assistance Bed Mobility: Supine to Sit     Supine to sit: Max assist;Total assist;+2 for physical assistance;+2 for safety/equipment     General bed mobility comments: assisted to sit up and get to side of bed    Transfers Overall transfer level: Needs assistance Equipment used: Rolling walker (2 wheeled) Transfers: Sit to/from Stand Sit to Stand: Total assist;+2 physical assistance;+2 safety/equipment         General transfer comment: pivot to chair with max to total of 2, placed lift pad under pt for return trip  Ambulation/Gait             General Gait Details: unable   Stairs             Wheelchair Mobility    Modified Rankin (Stroke Patients Only)       Balance Overall balance assessment: Needs assistance Sitting-balance support: Feet supported;Single extremity  supported Sitting balance-Leahy Scale: Fair     Standing balance support: Bilateral upper extremity supported;During functional activity Standing balance-Leahy Scale: Zero                              Cognition Arousal/Alertness: Awake/alert Behavior During Therapy: Flat affect;Restless Overall Cognitive Status: History of cognitive impairments - at baseline Area of Impairment: Problem solving;Awareness;Safety/judgement;Following commands;Memory;Attention;Orientation                 Orientation Level: Place;Time;Situation Current Attention Level: Selective Memory: Decreased recall of precautions;Decreased short-term memory Following Commands: Follows one step commands inconsistently;Follows one step commands with increased time Safety/Judgement: Decreased awareness of safety;Decreased awareness of deficits Awareness: Intellectual Problem Solving: Slow processing;Difficulty sequencing;Requires verbal cues;Requires tactile cues General Comments: alternating agreement and resistance to moving      Exercises General Exercises - Upper Extremity Shoulder Flexion: AAROM;10 reps General Exercises - Lower Extremity Ankle Circles/Pumps: AAROM;5 reps Quad Sets: AAROM;10 reps Hip ABduction/ADduction: AAROM;10 reps    General Comments General comments (skin integrity, edema, etc.): pivot to chair with total assist and will need lift back to bed, discussed and placed pad with nursing      Pertinent Vitals/Pain Pain Assessment: Faces Faces Pain Scale: Hurts little more Pain Location: movement of shoulders Pain Descriptors / Indicators: Guarding    Home Living  Prior Function            PT Goals (current goals can now be found in the care plan section) Acute Rehab PT Goals Patient Stated Goal: none stated Potential to Achieve Goals: Fair    Frequency    Min 2X/week      PT Plan Current plan remains appropriate     Co-evaluation              AM-PAC PT "6 Clicks" Mobility   Outcome Measure  Help needed turning from your back to your side while in a flat bed without using bedrails?: A Lot Help needed moving from lying on your back to sitting on the side of a flat bed without using bedrails?: A Lot Help needed moving to and from a bed to a chair (including a wheelchair)?: A Lot Help needed standing up from a chair using your arms (e.g., wheelchair or bedside chair)?: A Lot Help needed to walk in hospital room?: Total Help needed climbing 3-5 steps with a railing? : Total 6 Click Score: 10    End of Session   Activity Tolerance: Treatment limited secondary to agitation;Patient limited by fatigue Patient left: in chair;with call bell/phone within reach;with chair alarm set Nurse Communication: Mobility status PT Visit Diagnosis: Muscle weakness (generalized) (M62.81);Difficulty in walking, not elsewhere classified (R26.2)     Time: 6144-3154 PT Time Calculation (min) (ACUTE ONLY): 43 min  Charges:  $Therapeutic Exercise: 23-37 mins $Therapeutic Activity: 38-52 mins                 Ivar Drape 04/12/2020, 4:51 PM  Samul Dada, PT MS Acute Rehab Dept. Number: [facility] [phone] and [facility] [phone]

## 2020-04-13 NOTE — Progress Notes (Addendum)
Pt refused second attempt at giving morning meds.

## 2020-04-13 NOTE — Progress Notes (Signed)
Physical Therapy Treatment Patient Details Name: Nathan Schroeder MRN: [id number] DOB: 1960/12/11 Today's Date: 04/13/2020    History of Present Illness 60 y.o. male admitted on 03/10/20 for dementia with behavioral disturbance.  Pt from [facility].  Pt with significant PMH of HTN, anxiety, Alzheimer's disease.    PT Comments    Patient progressing some this session with initiation when given lots of extra time.  He attempted sit to stand with mod A, but then realized had pain and abruptly pushed back to sit on bed with assist.  Attempted later with +2 A, but pt resisting.  Feel he continues to needs skilled care to allow some progress.  PT to follow acutely.    Follow Up Recommendations  SNF     Equipment Recommendations  Hospital bed;Wheelchair cushion (measurements PT);Wheelchair (measurements PT)    Recommendations for Other Services       Precautions / Restrictions Precautions Precautions: Fall Precaution Comments: monitor HR and sats Restrictions Weight Bearing Restrictions: No    Mobility  Bed Mobility Overal bed mobility: Needs Assistance Bed Mobility: Supine to Sit     Supine to sit: Max assist;HOB elevated Sit to supine: Total assist;+2 for physical assistance   General bed mobility comments: attempted rolling to side, but pt resisting so elevated HOB and assisted feet off bed, then pushed trunk up from behind; to supine with RN to help with shoulders, PT assisted with feet.    Transfers Overall transfer level: Needs assistance Equipment used: Rolling walker (2 wheeled) Transfers: Sit to/from Stand Sit to Stand: Mod assist         General transfer comment: able to come up to stand part way with mod A when pt initiating, but when feeling pain in knees he had LOB posterior and assist to lower back to bed.  Ambulation/Gait                 Stairs             Wheelchair Mobility    Modified Rankin (Stroke Patients Only)        Balance Overall balance assessment: Needs assistance Sitting-balance support: Feet supported Sitting balance-Leahy Scale: Good Sitting balance - Comments: sat to eat bananna, drink cranberry juice and milk at EOB in between trying to get him to initiate sit to stand                                    Cognition Arousal/Alertness: Awake/alert Behavior During Therapy: Flat affect Overall Cognitive Status: History of cognitive impairments - at baseline Area of Impairment: Problem solving;Awareness;Safety/judgement;Following commands;Memory;Attention;Orientation                   Current Attention Level: Focused   Following Commands: Follows one step commands inconsistently;Follows one step commands with increased time Safety/Judgement: Decreased awareness of safety;Decreased awareness of deficits   Problem Solving: Slow processing;Decreased initiation;Difficulty sequencing;Requires verbal cues;Requires tactile cues        Exercises      General Comments        Pertinent Vitals/Pain Pain Assessment: Faces Faces Pain Scale: Hurts little more Pain Location: with mobility pt grimaces Pain Descriptors / Indicators: Guarding Pain Intervention(s): Monitored during session;Repositioned;Limited activity within patient's tolerance    Home Living                      Prior Function  PT Goals (current goals can now be found in the care plan section) Progress towards PT goals: Progressing toward goals    Frequency           PT Plan Current plan remains appropriate    Co-evaluation              AM-PAC PT "6 Clicks" Mobility   Outcome Measure  Help needed turning from your back to your side while in a flat bed without using bedrails?: Total Help needed moving from lying on your back to sitting on the side of a flat bed without using bedrails?: Total Help needed moving to and from a bed to a chair (including a  wheelchair)?: Total Help needed standing up from a chair using your arms (e.g., wheelchair or bedside chair)?: Total Help needed to walk in hospital room?: Total Help needed climbing 3-5 steps with a railing? : Total 6 Click Score: 6    End of Session   Activity Tolerance: Patient tolerated treatment well Patient left: in bed;with call bell/phone within reach;with nursing/sitter in room;with bed alarm set   PT Visit Diagnosis: Muscle weakness (generalized) (M62.81);Difficulty in walking, not elsewhere classified (R26.2)     Time: 4034-7425 PT Time Calculation (min) (ACUTE ONLY): 51 min  Charges:  $Therapeutic Activity: 38-52 mins                     Sheran Lawless, PT Acute Rehabilitation Services [phone] Office:[phone] 04/13/2020    Elray Mcgregor 04/13/2020, 2:03 PM

## 2020-04-13 NOTE — Progress Notes (Signed)
Attempted to give pt meds at 1100. Pt said "okay" when I asked him to open his mouth for pudding or for medicine. Pt would close his mouth every time I got the spoon of pudding close to his mouth.  Paged Dr. Austin Miles. Let him know I will try again on the next round of meds after lunch.

## 2020-04-13 NOTE — Progress Notes (Signed)
   Subjective:   No acute overnight events.  More alert during my encounter. Responds "yes" or "okay" to most questions. Responded with "that's fine, I'm here" when I asked if he was comfortable.   Objective:  Vital signs in last 24 hours: Vitals:   04/12/20 0650 04/12/20 1445 04/12/20 2000 04/13/20 0500  BP: 99/64 113/69 (!) 126/91 (!) 154/76  Pulse: 66 66 79 74  Resp: 16 16 18 17   Temp: 98 F (36.7 C) 97.8 F (36.6 C) 97.9 F (36.6 C) 98.7 F (37.1 C)  TempSrc: Axillary Axillary Axillary Axillary  SpO2: 98% 95% 96% 97%  Weight:       General: elderly gentleman, lying in bed, NAD. Pulm: normal work of breathing, no respiratory distress. Neuro: alert, mental status per baseline. Psych: calm, cooperative.  Assessment/Plan:  Principal Problem:   End of life care Active Problems:   Alzheimer's disease (HCC)   Aggressive behavior   Behavioral and psychological symptoms of dementia [facility])  Hospital day #34 for Nathan Schroeder a 60 year old person with early onset Alzheimer's dementia who presented from memory care unit for agitation with rapid decline in mental and functional status. We continued his home medications. Appreciate palliative care who has been following and also updating family. [facility] referral still pending, appreciate TOC assistance. -overall, doing well after risperidone discontinued. Alert, calm, and cooperative -worked well with physical therapy yesterday, cooperated and moved to chair with max assistance  Urinary Retention Likely bladder stretch injury. Foley catheter in place for decompression, will need it temporarily while bladder heals.   Prior to Admission Living Arrangement: Memory care unit Anticipated Discharge Location: Long-term care Barriers to Discharge: Placement Dispo: Pending LTC placement  [facility], MD 04/13/2020, 12:43 PM Pager: [phone] After 5pm on weekdays and 1pm on weekends: On Call pager [phone]

## 2020-04-13 NOTE — Progress Notes (Signed)
Pt refused 1600 dose of depakote. He ate the applesauce without the medication, but spit out each bite that had medication.  Pt was calm and cooperative today. He folded his linens and used his activity mat in the bed today.

## 2020-04-14 ENCOUNTER — Telehealth: Payer: Self-pay | Admitting: Internal Medicine
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
## 2020-04-14 NOTE — Progress Notes (Signed)
   Subjective:  Patient refused medications today.  He wakes up on exam and responds that is good and yes to my questions.  Appears calm and in no distress.     Objective:  Vital signs in last 24 hours: Vitals:   04/13/20 0500 04/13/20 1408 04/13/20 2135 04/14/20 1401  BP: (!) 154/76 134/73 112/71 119/69  Pulse: 74 77 86 76  Resp: 17 17 16 16   Temp: 98.7 F (37.1 C) 98.3 F (36.8 C) 99.1 F (37.3 C) 98.6 F (37 C)  TempSrc: Axillary Oral Oral   SpO2: 97% 97% 96% 98%  Weight:       General: elderly gentleman, lying in bed, focused to me when spoken to CV: Regular rate and rhythm no murmurs rubs or gallops Pulm: normal work of breathing, no respiratory distress.   Assessment/Plan:  Principal Problem:   End of life care Active Problems:   Alzheimer's disease (HCC)   Aggressive behavior   Behavioral and psychological symptoms of dementia [facility])  Hospital day #35 for Nathan Schroeder a 60 year old person with early onset Alzheimer's dementia who presented from memory care unit for agitation with rapid decline in mental and functional status. We continued his home medications. Appreciate palliative care who has been following and also updating family. [facility] referral still pending, appreciate TOC assistance. -Refusing oral medications today.  No IV access, will encourage reattempt to provide oral medications.   Urinary Retention Likely bladder stretch injury. Foley catheter in place for decompression, will need it temporarily while bladder heals.  Prior to Admission Living Arrangement: Memory care unit Anticipated Discharge Location: Long-term care Barriers to Discharge: Placement Dispo: Pending LTC placement  [facility], MD 04/14/2020, 6:23 PM Pager: [phone] After 5pm on weekdays and 1pm on weekends: On Call pager [phone]

## 2020-04-15 MED ORDER — LORAZEPAM 2 MG/ML PO CONC
1.0000 mg | ORAL | Status: DC | PRN
  Administered 2020-05-14 – 2020-05-15 (×3): 1 mg via ORAL
  Filled 2020-04-15 (×3): qty 1

## 2020-04-15 NOTE — Plan of Care (Signed)

## 2020-04-15 NOTE — Progress Notes (Addendum)
Brief Palliative Medicine Progress Note:  Chart review performed. Noted patient has no IV access, refusing PO medications, and becoming combative when receiving care.  Ordered lorazepam concentrated solution 1mg  q4h PRN anxiety/sleep - maybe he will be accepting of liquids. Recommend trying to administer dose prior to providing care.  Thank you for allowing PMT to assist in the care of this patient.  Amber M. [facility] Palliative Medicine Team Team Phone: [phone] NO CHARGE

## 2020-04-15 NOTE — Progress Notes (Signed)
   Subjective:  Patient cooperative on exam today.  We were able to straighten patient in bed , slide him up, and help him with his gown without any difficulty. Provided his Posey belt and turned lights on to give him a sense of daytime. He responds yes or okay to most questions.  Appreciate RN, she will check into finding patient a longer bed.   Objective:  Vital signs in last 24 hours: Vitals:   04/13/20 1408 04/13/20 2135 04/14/20 1401 04/14/20 2124  BP: 134/73 112/71 119/69 126/75  Pulse: 77 86 76 77  Resp: 17 16 16 20   Temp: 98.3 F (36.8 C) 99.1 F (37.3 C) 98.6 F (37 C) 98.2 F (36.8 C)  TempSrc: Oral Oral  Oral  SpO2: 97% 96% 98% 95%  Weight:       General: Elderly man, lying in bed with feet hanging off left edge leaning on right side, side rails are up CV : regular rate and rhythm no murmurs rubs or gallops Abdomen: Active bowel sounds, no grimace on palpation Psych: Calm , cooperative Neuro: Alert, responsive to questions with yes and okay  Assessment/Plan:  Principal Problem:   End of life care Active Problems:   Alzheimer's disease (HCC)   Aggressive behavior   Behavioral and psychological symptoms of dementia [facility])  Hospital day #36 for Nathan Schroeder a 60 year old person with early onset Alzheimer's dementia who presented from memory care unit for agitation with rapid decline in mental and functional status.   Early onset Alzheimer's dementia -Patient refusing medications overnight.  He recently took some of his medications in the evening.  Palliative care team  ordered lorazepam today to help with anxiety/sleep.  Patient continues to be cooperative on physicians exam, but we are not administering medications.  This seems to be a trigger for him and appreciate nursings repeated attempts to provide medication. In addition nursing checking on a larger bed to help with comfort. No placement found as of today.  Urinary Retention Likely bladder stretch injury.  Foley catheter in place for decompression, will need it temporarily while bladder heals.  This is not a barrier to discharge.  Prior to Admission Living Arrangement: Memory care unit Anticipated Discharge Location: Long-term care Barriers to Discharge: Placement Dispo: Pending LTC placement  77, MD 04/15/2020, 6:25 AM Pager: [phone] After 5pm on weekdays and 1pm on weekends: On Call pager [phone]

## 2020-04-15 NOTE — Progress Notes (Addendum)
2563: Pt refusing morning meds at this time. RN educated and encouraged pt with applesauce to take meds. Pt continued to refuse and turns head away during RN attempt. Will try again.  1014: Pt on specialty air loss bed. [facility] contacted per Dr. Barbaraann Faster request for end of bed extension. Per portables, this bed does not have an extension. RN placed pillows under pt heels. Will continue to monitor.  1103: Pt continuing to refuse morning meds at this time. Will continue to monitor.

## 2020-04-16 NOTE — Progress Notes (Addendum)
CSW spoke with Rasheema at [facility] - she requested updated clinicals including H&P, problem list, and current medications be sent over for review. CSW sent information via fax for review - facility will not be able to offer patient a bed.  Edwin Dada, MSW, LCSW Transitions of Care  Clinical Social Worker II [phone]

## 2020-04-16 NOTE — Progress Notes (Signed)
Nutrition Follow-up  DOCUMENTATION CODES:   Obesity unspecified  INTERVENTION:   Feeding assistance at all meal times; Feeding assistance will need to continue at facility post discharge. RD concerned that if pt not assisted/encouraged at meals then it is likely that he will not eat  Continue [employer] as able; pt unable to verbalize food preferences  Continue Ensure Enlive po TID, each supplement provides 350 kcal and 20 grams of protein  Continue MVI with Minerals   NUTRITION DIAGNOSIS:   Inadequate oral intake related to decreased appetite as evidenced by meal completion < 50%.  Being addressed via supplements, feeding assistance  GOAL:   Patient will meet greater than or equal to 90% of their needs  Progressing  MONITOR:   PO intake,Supplement acceptance,Labs,Weight trends,Skin,I & O's  REASON FOR ASSESSMENT:   Consult Assessment of nutrition requirement/status  ASSESSMENT:   Nathan Schroeder is a 60 y/o male with a history of early onset alzheimer's disease presenting from memory care unit for aggressive behavior with rapid decline in he mental and functional status.   Pt responds "yes" to all questions. Awaiting placement  Pt requires feeding assistance at meal times. RN indicates pt ate 1/2 biscuit, bacon and a whole banana at breakfast this AM. Pt then drank a whole Ensure Enlive  Recorded po intake variable but appears to have improved over the last 24 hours. 0-100% of meals  RN reports that pt did hold the biscuit and banana to feed self but RN also indicated that she put a straw to pt's lips and he did not drink. RN had to begin to pour drink into patient's mouth slowly to get him to begin drinking. Pt needs encouragement at meals times   Labs: CBGs 117 Meds: MVI with Minerals   Diet Order:   Diet Order            DIET FINGER FOODS Room service appropriate? No; Fluid consistency: Thin  Diet effective now                 EDUCATION NEEDS:   No  education needs have been identified at this time  Skin:  Skin Assessment: Reviewed RN Assessment  Last BM:  3/21  Height:   Ht Readings from Last 1 Encounters:  09/22/19 6' (1.829 m)    Weight:   Wt Readings from Last 1 Encounters:  03/17/20 124.9 kg    Ideal Body Weight:  80.9 kg  BMI:  Body mass index is 37.34 kg/m.  Estimated Nutritional Needs:   Kcal:  2200-2400  Protein:  120-135 grams  Fluid:  > 2 L    Nathan Starcher MS, RDN, LDN, CNSC Registered Dietitian III Clinical Nutrition RD Pager and On-Call Pager Number Located in [employer]

## 2020-04-16 NOTE — Progress Notes (Signed)
   Subjective:  No acute overnight events.  Sleeping comfortably upon entry, but arousable to voice. Mental status per baseline, responding "yes" and "okay" to all questions today.   Objective:  Vital signs in last 24 hours: Vitals:   04/14/20 2124 04/15/20 1437 04/15/20 2051 04/16/20 0437  BP: 126/75 130/68 132/86 130/85  Pulse: 77 72 70 68  Resp: 20 17 18 18   Temp:  98.1 F (36.7 C) 98.2 F (36.8 C) 98.1 F (36.7 C)  TempSrc: Oral Oral Oral Oral  SpO2: 95% 96% 98% 97%  Weight:       General: Elderly male, lying in bed, NAD. CV: normal rate and regular rhythm, no m/r/g. Pulm: normal work of breathing, no respiratory distress. Psych: calm, cooperative. Neuro: alert, responds to questions with "yes" or "okay"   Assessment/Plan:  Principal Problem:   End of life care Active Problems:   Alzheimer's disease (HCC)   Aggressive behavior   Behavioral and psychological symptoms of dementia [facility])  Hospital day #37 for Nathan Schroeder a 60 year old person with early onset Alzheimer's dementia who presented from memory care unit for agitation with rapid decline in mental and functional status.   Early onset Alzheimer's dementia Patient awaiting long term care placement, unfortunately no bed offers at this time. Patient took his medications this morning, appreciate nursing assistance with providing medications. He is medically stable for discharge, pending LTC placement.  Urinary Retention Likely bladder stretch injury. Foley catheter in place for decompression, will need it temporarily while bladder heals.  This is not a barrier to discharge.  Prior to Admission Living Arrangement: Memory care unit Anticipated Discharge Location: Long-term care Barriers to Discharge: Placement Dispo: Pending LTC placement  77, MD 04/16/2020, 2:11 PM Pager: [phone] After 5pm on weekdays and 1pm on weekends: On Call pager [phone]

## 2020-04-17 NOTE — Progress Notes (Addendum)
2:30pm: [facility] declined to offer a bed.  [facility] declined to offer a bed.  12pm: Tanya completed visit with patient - she will contact his son for further discussion before offering a bed.  [facility] declined to offer a bed.  10am: Tanya from [facility] is planning to come and visit the patient to screen for possible admission to the facility.  CSW spoke with Seth Bake at [facility] - there are no short term beds available until next week and there is a waiting list for long term beds.  9am: CSW met with patient at bedside to introduce self - patient asleep but opened his eyes when I spoke his name. CSW informed patient of attempts to find him new placement, he smiled and closed his eyes.  7:45am: CSW faxed clinicals to [place], [facility], [place], [facility], [facility], and [facility] for review.  CSW spoke with patient's son Nathan Schroeder to discuss a discharge plan. Nathan Schroeder confirms the family can private pay for placement in a facility. Nathan Schroeder states he and his sister reside in [place], and his mother resides in [place]. Nathan Schroeder reports his mother previously hired Dance movement psychotherapist for the patient, but due to inadequate and unreliable staffing - the patient required placement at [facility]. Nathan Schroeder states that prior to this hospitalization, the patient was able to ambulate but now he cannot.  CSW left voicemail for Nathan Schroeder at [facility].  Nathan Schroeder, MSW, LCSW Transitions of Care  Clinical Social Worker II [phone]

## 2020-04-17 NOTE — Progress Notes (Signed)
Pt became a little irritated with nurse and physical therapy but once communicated with the patient he was able to takes his meds and sit up EOB, hold his ensure for at least a couple minutes. Pt is back in bed and resting. Bed alarm set.

## 2020-04-17 NOTE — Progress Notes (Signed)
Physical Therapy Treatment Patient Details Name: Nathan Schroeder MRN: [id number] DOB: [birth year] Today's Date: 04/17/2020    History of Present Illness 60 y.o. male admitted on 03/10/20 for dementia with behavioral disturbance.  Pt from [facility].  Pt with significant PMH of HTN, anxiety, Alzheimer's disease.    PT Comments    Pt with limited progress.  Provided increased time for pt to process/respond to request/commands and transfers.  Pt would frequently state "ok or yes" but then unable to follow through or would resist movement.  Pt with good strength to complete mobility, but is unable to coordinate movements dues to confusion/dementia.  At times, the more therapy tried to physically assist the more pt resisted.  Pt responded better to increased time to respond to "general" request with light facilitation techniques to initiate movement. Continue to progress as able - pt was ambulatory prior to admission.    Follow Up Recommendations  SNF     Equipment Recommendations  Hospital bed;Wheelchair cushion (measurements PT);Wheelchair (measurements PT) (hoyer)    Recommendations for Other Services       Precautions / Restrictions Precautions Precautions: Fall Precaution Comments: At times can be combative    Mobility  Bed Mobility Overal bed mobility: Needs Assistance Bed Mobility: Supine to Sit;Sit to Supine     Supine to sit: Max assist;+2 for physical assistance Sit to supine: Total assist;+2 for physical assistance   General bed mobility comments: Supine to sit - pt able to assist with sliding legs off bed and reached for therapist hands,  pt with stronge posterior lean requiring max x 2 to get to EOB;  Sit to supine: total x 2 helicopter technique as pt getting close to EOB and needed to return to supine.  All required increased time with general cues and time for pt to process    Transfers Overall transfer level: Needs assistance Equipment used:  Rolling walker (2 wheeled);2 person hand held assist Transfers: Sit to/from Stand Sit to Stand: Mod assist;+2 physical assistance         General transfer comment: Attempted multiple times to encourage pt to "stand", "walk to window" , "move to chair;"  pt would state "Yes" or "ok" and at times initiate but then would no longer assist and unable to complete transfer.  Did one partial stand with feet blocked but was not able to clear buttock from bed.  Attempted with RW and HHA of 2.  Ambulation/Gait             General Gait Details: unable   Stairs             Wheelchair Mobility    Modified Rankin (Stroke Patients Only)       Balance Overall balance assessment: Needs assistance Sitting-balance support: Feet supported;No upper extremity supported Sitting balance-Leahy Scale: Good Sitting balance - Comments: Pt initially requiring max x2 with stronger posterior/R lean but with cues to sit and reach forward progressed to SBA.  Used bed sheet around pelvis to facilitate upright posture initially.  Once sitting , maintained EOB balance for 20 minutes with supervision. Pt drank his Ensure and washed face at EOB.     Standing balance-Leahy Scale: Zero Standing balance comment: unable                            Cognition Arousal/Alertness: Awake/alert Behavior During Therapy: Flat affect Overall Cognitive Status: History of cognitive impairments - at baseline  Area of Impairment: Problem solving;Awareness;Safety/judgement;Following commands;Memory;Attention;Orientation                 Orientation Level: Disoriented to;Place;Time;Situation Current Attention Level: Focused Memory: Decreased recall of precautions;Decreased short-term memory Following Commands: Follows one step commands inconsistently;Follows one step commands with increased time Safety/Judgement: Decreased awareness of safety;Decreased awareness of deficits Awareness:  Intellectual Problem Solving: Slow processing;Decreased initiation;Difficulty sequencing;Requires verbal cues;Requires tactile cues General Comments: alternating agreement and resistance to moving      Exercises      General Comments        Pertinent Vitals/Pain Pain Assessment: No/denies pain    Home Living                      Prior Function            PT Goals (current goals can now be found in the care plan section) Acute Rehab PT Goals Patient Stated Goal: none stated PT Goal Formulation: Patient unable to participate in goal setting Time For Goal Achievement: 05/01/20 Potential to Achieve Goals: Fair Progress towards PT goals: Progressing toward goals (limited progress)    Frequency    Min 2X/week      PT Plan Current plan remains appropriate    Co-evaluation              AM-PAC PT "6 Clicks" Mobility   Outcome Measure  Help needed turning from your back to your side while in a flat bed without using bedrails?: Total Help needed moving from lying on your back to sitting on the side of a flat bed without using bedrails?: Total Help needed moving to and from a bed to a chair (including a wheelchair)?: Total Help needed standing up from a chair using your arms (e.g., wheelchair or bedside chair)?: Total Help needed to walk in hospital room?: Total Help needed climbing 3-5 steps with a railing? : Total 6 Click Score: 6    End of Session Equipment Utilized During Treatment: Gait belt Activity Tolerance: Patient tolerated treatment well Patient left: in bed;with call bell/phone within reach;with bed alarm set Nurse Communication: Mobility status PT Visit Diagnosis: Muscle weakness (generalized) (M62.81);Difficulty in walking, not elsewhere classified (R26.2)     Time: 1530-1610 PT Time Calculation (min) (ACUTE ONLY): 40 min  Charges:  $Therapeutic Activity: 38-52 mins                     Anise Salvo, PT Acute Rehab Services Pager  [phone] [facility] [phone]     Rayetta Humphrey 04/17/2020, 4:26 PM

## 2020-04-17 NOTE — Progress Notes (Addendum)
   Subjective:  No acute overnight events.  Eating food and drinking glucerna. Answers "yes" or "okay" to all questions, per baseline.  Objective:  Vital signs in last 24 hours: Vitals:   04/15/20 1437 04/15/20 2051 04/16/20 0437 04/17/20 0559  BP: 130/68 132/86 130/85 119/76  Pulse: 72 70 68 62  Resp: 17 18 18 18   Temp: 98.1 F (36.7 C) 98.2 F (36.8 C) 98.1 F (36.7 C) 98.1 F (36.7 C)  TempSrc: Oral Oral Oral Oral  SpO2: 96% 98% 97% 98%  Weight:       General: pleasant elderly male, lying in bed, NAD. Eyes: EOMI Pulm: normal work of breathing, no respiratory distress. Psych: calm, cooperative. Neuro: alert, says "yes" or "okay" to all questions.  General: Elderly male, lying in bed, NAD. CV: normal rate and regular rhythm, no m/r/g. Pulm: normal work of breathing, no respiratory distress. Psych: calm, cooperative. Neuro: alert, responds to questions with "yes" or "okay"   Assessment/Plan:  Principal Problem:   End of life care Active Problems:   Alzheimer's disease (HCC)   Aggressive behavior   Behavioral and psychological symptoms of dementia [facility])  Hospital day #38 for Nathan Schroeder a 60 year old person with early onset Alzheimer's dementia who presented from memory care unit for agitation with rapid decline in mental and functional status. He is medically stable for discharge and awaiting LTC placement.   Early onset Alzheimer's dementia Patient awaiting long term care placement, unfortunately no bed offers at this time, but greatly appreciate SW assistance. Per RN, patient took his medications this morning without any issues. He remains medically stable for discharge, pending LTC placement.  Urinary Retention Likely bladder stretch injury. Foley catheter in place for decompression, will need it temporarily while bladder heals. This is not a barrier to discharge.  Prior to Admission Living Arrangement: Memory care unit Anticipated Discharge Location:  Long-term care Barriers to Discharge: Placement Dispo: Pending LTC placement  77, MD 04/17/2020, 11:43 AM Pager: [phone] After 5pm on weekdays and 1pm on weekends: On Call pager [phone]

## 2020-04-18 NOTE — Progress Notes (Signed)
[facility] ([facility])      This patient has been referred for hospice services after discharge.  [facility] will continue to follow for any discharge planning needs and to coordinate admission onto hospice care, as appropriate.   Thank you for the opportunity to participate in this patient's care.     Chrislyn King, BSN, RN [facility] Hospital Liaison [phone]   [phone] (24h on call)     

## 2020-04-18 NOTE — Progress Notes (Signed)
   Subjective:  No acute overnight events.  Sleeping comfortably, but arousable to voice and subsequently alert. Responds with "yes" or "okay" to all questions, per baseline.  Objective:  Vital signs in last 24 hours: Vitals:   04/16/20 0437 04/17/20 0559 04/17/20 1344 04/18/20 0511  BP: 130/85 119/76 93/64 119/72  Pulse: 68 62 72 61  Resp: 18 18 16 16   Temp: 98.1 F (36.7 C) 98.1 F (36.7 C) 98.1 F (36.7 C) 97.8 F (36.6 C)  TempSrc: Oral Oral Axillary   SpO2: 97% 98% 96% 98%  Weight:       General: elderly male, lying comfortably in bed, NAD. Eyes: EOMI Pulm: normal work of breathing, no respiratory distress. Psych: calm, cooperative. Neuro: alert, responds "yes" or "okay" to questions.  Assessment/Plan:  Principal Problem:   End of life care Active Problems:   Alzheimer's disease (HCC)   Aggressive behavior   Behavioral and psychological symptoms of dementia [facility])  Hospital day #39 for Nathan Schroeder a 60 year old person with early onset Alzheimer's dementia who presented from memory care unit for agitation with rapid decline in mental and functional status. He is medically stable for discharge and awaiting LTC placement.   Early onset Alzheimer's dementia Patient awaiting long term care placement, greatly appreciate SW assistance. [facility] visited patient yesterday with plan to contact patient's son, Youssef, for further discussion prior to offering a bed. Patient remains medically stable for discharge, pending LTC placement.  Urinary Retention Likely bladder stretch injury. Foley catheter in place for decompression, will need it temporarily while bladder heals, currently on Day 10. This is not a barrier to discharge.  Prior to Admission Living Arrangement: Memory care unit Anticipated Discharge Location: Long-term care Barriers to Discharge: Placement Dispo: Pending LTC placement  Nathan Senior, MD 04/18/2020, 11:32 AM Pager: [phone] After  5pm on weekdays and 1pm on weekends: On Call pager [phone]

## 2020-04-19 NOTE — Progress Notes (Signed)
° °  Subjective:   Patient at baseline mental function. He is alert but responds only "yes" or "okay" in response to questions. Pleasant. Does not follow commands. Does not appear to be in distress.  Objective:  Vital signs in last 24 hours: Vitals:   04/17/20 0559 04/17/20 1344 04/18/20 0511 04/18/20 2251  BP: 119/76 93/64 119/72 (!) 116/52  Pulse: 62 72 61 75  Resp: 18 16 16 17   Temp: 98.1 F (36.7 C) 98.1 F (36.7 C) 97.8 F (36.6 C)   TempSrc: Oral Axillary    SpO2: 98% 96% 98% 97%  Weight:       Physical Exam Vitals and nursing note reviewed.  Constitutional: elderly obese gentleman, alert, looks around and interactive but does not answer questions or follow commands appropriately, no acute distress Head: atraumatic ENT: external ears normal Eyes: EOMI Cardiovascular: regular rate and rhythm, normal heart sounds Pulmonary: effort normal, normal breath sounds bilaterally Abdominal: flat, nontender, no rebound tenderness, bowel sounds normal Skin: warm and dry Neurological: alert Psychiatric: normal mood and affect   Assessment/Plan:  Principal Problem:   End of life care Active Problems:   Alzheimer's disease (HCC)   Aggressive behavior   Behavioral and psychological symptoms of dementia [facility])  Hospital day #40 for Nathan Schroeder a 60 year old person with early onset Alzheimer's dementia who presented from memory care unit for agitation with rapid decline in mental and functional status. He is medically stable for discharge and awaiting LTC placement.   Early onset Alzheimer's dementia Patient awaiting long term care placement, greatly appreciate SW assistance. [facility] visited patient yesterday with plan to contact patient's son, Nathan Schroeder, for further discussion prior to offering a bed. Patient remains medically stable for discharge, pending LTC placement.  Urinary Retention Likely bladder stretch injury. Foley catheter in place for decompression, will  need it temporarily while bladder heals, currently on Day 11. Plan to keep in for 2-3 weeks. This is not a barrier to discharge.  Prior to Admission Living Arrangement: Memory care unit Anticipated Discharge Location: Long-term care Barriers to Discharge: Placement Dispo: Pending LTC placement  Nathan Senior, MD 04/19/2020, 6:56 AM Pager: [phone] After 5pm on weekdays and 1pm on weekends: On Call pager [phone]

## 2020-04-19 NOTE — Progress Notes (Addendum)
CSW contacted the following facilities requesting a review:  [facility] - no answer, voicemail left for [facility] - spoke with French Ana, clinicals faxed [facility] - spoke with Marcelino Duster, cost per month exceeds family budget by $3,000 [employer] - spoke with Judeth Cornfield, clinicals faxed - denied due to not meeting age requirement of [id number] - spoke with Arline Asp - denied due to not meeting age requirement of 78 [address] [place] - clinicals faxed [facility] - clinicals faxed [facility] - spoke with Darel Hong, clinicals sent via email for review  Edwin Dada, MSW, LCSW Transitions of Care  Clinical Social Worker II [phone]

## 2020-04-20 NOTE — Progress Notes (Signed)
CSW spoke with patient's wife Elease Hashimoto and son Kristine to discuss their request to complete a referral to [facility].   CSW spoke with admissions at [facility] who states the private pay rate is approximately $25,000 a month and the waiting list is extensive.  CSW informed family of information - both state that price is far out of their budget. Family agreeable for CSW to continue search for placement.  Edwin Dada, MSW, LCSW Transitions of Care  Clinical Social Worker II [phone]

## 2020-04-20 NOTE — Progress Notes (Signed)
Patient crooked in bed, head against side rail, incontinent of stool.  Attempted to turn/pull patient up in bed. Patient began kicking and swinging is fists at staff, threw his blood pressure cuff and grabbed the oxygen on wall.  Patient was cleaned up from incontinent stool and repositioned.  Patient calmed as care stopped.

## 2020-04-20 NOTE — Progress Notes (Signed)
Physical Therapy Treatment Patient Details Name: Nathan Schroeder MRN: [id number] DOB: 1960/10/28 Today's Date: 04/20/2020    History of Present Illness 60 y.o. male admitted on 03/10/20 for dementia with behavioral disturbance.  Pt from [facility].  Pt with significant PMH of HTN, anxiety, Alzheimer's disease.    PT Comments    Pt was seen for mobility to reposition on the bed and try to get OOB, but is unable to get to side of bed.  Lethargic then combative about getting repositioned to move R shoulder and head off the side of bed.  Follow along but may be getting to where he is not able to benefit from PT.  Will reassess this next visit to see if further progress is to be expected.  Has Hospice plan on the chart.     Follow Up Recommendations  SNF     Equipment Recommendations  Hospital bed;Wheelchair (measurements PT);Wheelchair cushion (measurements PT)    Recommendations for Other Services       Precautions / Restrictions Precautions Precautions: Fall Precaution Comments: Combative Restrictions Weight Bearing Restrictions: No    Mobility  Bed Mobility Overal bed mobility: Needs Assistance Bed Mobility:  (repositioning to get off R shoulder and head on railing)           General bed mobility comments: pt became agitated at PT trying to get him more comfortable and reached up to strike PT in the face    Transfers                 General transfer comment: unable to do due to combativeness  Ambulation/Gait                 Stairs             Wheelchair Mobility    Modified Rankin (Stroke Patients Only)       Balance                                            Cognition Arousal/Alertness: Lethargic Behavior During Therapy: Impulsive;Agitated;Flat affect Overall Cognitive Status: History of cognitive impairments - at baseline Area of Impairment: Problem solving;Awareness;Safety/judgement;Following  commands;Memory;Attention;Orientation                 Orientation Level: Place;Time;Situation Current Attention Level: Selective Memory: Decreased recall of precautions;Decreased short-term memory Following Commands:  (not following instructions) Safety/Judgement: Decreased awareness of safety;Decreased awareness of deficits Awareness: Intellectual Problem Solving: Slow processing;Requires verbal cues;Requires tactile cues;Decreased initiation (avoiding participation) General Comments: combative today with attempts to see him      Exercises      General Comments        Pertinent Vitals/Pain Pain Assessment: Faces Faces Pain Scale: Hurts little more Pain Location: R shoulder Pain Descriptors / Indicators: Grimacing Pain Intervention(s): Other (comment);Repositioned;Limited activity within patient's tolerance;Monitored during session (tried to reposition)    Home Living                      Prior Function            PT Goals (current goals can now be found in the care plan section) Acute Rehab PT Goals Patient Stated Goal: none stated Progress towards PT goals: Not progressing toward goals - comment    Frequency    Min 2X/week      PT  Plan Current plan remains appropriate    Co-evaluation              AM-PAC PT "6 Clicks" Mobility   Outcome Measure  Help needed turning from your back to your side while in a flat bed without using bedrails?: Total Help needed moving from lying on your back to sitting on the side of a flat bed without using bedrails?: Total Help needed moving to and from a bed to a chair (including a wheelchair)?: Total Help needed standing up from a chair using your arms (e.g., wheelchair or bedside chair)?: Total Help needed to walk in hospital room?: Total Help needed climbing 3-5 steps with a railing? : Total 6 Click Score: 6    End of Session   Activity Tolerance: Other (comment);Treatment limited secondary to  agitation Patient left: in bed;with call bell/phone within reach;with bed alarm set Nurse Communication: Mobility status PT Visit Diagnosis: Adult, failure to thrive (R62.7);Other abnormalities of gait and mobility (R26.89)     Time: 1884-1660 PT Time Calculation (min) (ACUTE ONLY): 14 min  Charges:  $Therapeutic Activity: 8-22 mins                    Ivar Drape 04/20/2020, 5:36 PM Samul Dada, PT MS Acute Rehab Dept. Number: [facility] [id number] and [facility] [phone]

## 2020-04-20 NOTE — Progress Notes (Signed)
[facility] ([facility])      This patient has been referred for hospice services after discharge.  [facility] will continue to follow for any discharge planning needs and to coordinate admission onto hospice care, as appropriate.   Thank you for the opportunity to participate in this patient's care.     Chrislyn King, BSN, RN [facility] Hospital Liaison [phone]   [phone] (24h on call)     

## 2020-04-20 NOTE — Progress Notes (Addendum)
   Subjective:   No acute overnight events.  Sleeping comfortably, but alert to voice. Responds "yes" or "okay" to most questions.  Objective:  Vital signs in last 24 hours: Vitals:   04/17/20 1344 04/18/20 0511 04/18/20 2251 04/19/20 2211  BP: 93/64 119/72 (!) 116/52 (!) 102/42  Pulse: 72 61 75 63  Resp: 16 16 17 16   Temp: 98.1 F (36.7 C) 97.8 F (36.6 C)  98.6 F (37 C)  TempSrc: Axillary   Axillary  SpO2: 96% 98% 97% 98%  Weight:       Physical Exam General: elderly male, lying in bed, NAD. Pulm: normal respiratory effort, no respiratory distress. Neuro: alert to voice, mental status per baseline. Psych: calm. Extremities: blanching redness on pressure points of heels. Tinea pedis noted on bilateral feet.  Assessment/Plan:  Principal Problem:   End of life care Active Problems:   Alzheimer's disease (HCC)   Aggressive behavior   Behavioral and psychological symptoms of dementia [facility])  Hospital day #41 for Nathan Schroeder a 60 year old person with early onset Alzheimer's dementia who presented from memory care unit for agitation with rapid decline in mental and functional status. He is medically stable for discharge and awaiting LTC placement.   Early onset Alzheimer's dementia Patient awaiting long term care placement, greatly appreciate SW assistance. Patient remains medically stable for discharge, pending LTC placement. Unfortunately, still no bed offers.  Urinary Retention Likely bladder stretch injury. Foley catheter in place for decompression, will need it temporarily while bladder heals, currently on Day 12. Plan to keep in for 2-3 weeks. This is not a barrier to discharge.  Prior to Admission Living Arrangement: Memory care unit Anticipated Discharge Location: Long-term care Barriers to Discharge: Placement Dispo: Pending LTC placement  77, MD 04/20/2020, 1:53 PM Pager: [phone] After 5pm on weekdays and 1pm on weekends: On Call pager  [phone]

## 2020-04-20 NOTE — Plan of Care (Signed)
  Problem: Clinical Measurements: Goal: Will remain free from infection Outcome: Progressing Goal: Cardiovascular complication will be avoided Outcome: Progressing   

## 2020-04-21 LAB — COMPREHENSIVE METABOLIC PANEL
ALT: 26 U/L (ref 0–44)
AST: 27 U/L (ref 15–41)
Albumin: 3.1 g/dL — ABNORMAL LOW (ref 3.5–5.0)
Alkaline Phosphatase: 71 U/L (ref 38–126)
Anion gap: 7 (ref 5–15)
BUN: 15 mg/dL (ref 8–23)
CO2: 25 mmol/L (ref 22–32)
Calcium: 8.7 mg/dL — ABNORMAL LOW (ref 8.9–10.3)
Chloride: 104 mmol/L (ref 98–111)
Creatinine, Ser: 0.85 mg/dL (ref 0.61–1.24)
GFR, Estimated: >60 mL/min (ref >60)
Glucose, Bld: 86 mg/dL (ref 70–99)
Potassium: 3.8 mmol/L (ref 3.5–5.1)
Sodium: 136 mmol/L (ref 135–145)
Total Bilirubin: 0.8 mg/dL (ref 0.3–1.2)
Total Protein: 7 g/dL (ref 6.5–8.1)

## 2020-04-21 NOTE — Plan of Care (Signed)

## 2020-04-21 NOTE — Progress Notes (Signed)
Attempted to draw lab x2 but was unsuccessful because patient was combative and tried to hit phlebotomist.

## 2020-04-21 NOTE — Progress Notes (Signed)
   Subjective:   No acute overnight events.  Alert upon arrival to room, watching television. Still responds with "yes" or "okay" to all questions.  Objective:  Vital signs in last 24 hours: Vitals:   04/19/20 2211 04/20/20 1834 04/20/20 2015 04/21/20 0531  BP: (!) 102/42 (!) 105/48 119/63 125/68  Pulse: 63 62 70 61  Resp: 16 16 14 20   Temp: 98.6 F (37 C) 98.3 F (36.8 C) 98 F (36.7 C) 98.8 F (37.1 C)  TempSrc: Axillary Oral Oral Oral  SpO2: 98% 98% 99% 99%  Weight:       Physical Exam General: elderly male, lying in bed, NAD. Pulm: normal respiratory effort, no respiratory distress. Neuro: alert to voice, mental status per baseline. Skin: warm and dry. Psych: calm.  Assessment/Plan:  Principal Problem:   End of life care Active Problems:   Alzheimer's disease (HCC)   Aggressive behavior   Behavioral and psychological symptoms of dementia [facility])  Hospital day #42 for Nathan Schroeder a 60 year old person with early onset Alzheimer's dementia who presented from memory care unit for agitation with rapid decline in mental and functional status. He is medically stable for discharge and awaiting LTC placement.   Early onset Alzheimer's dementia Patient awaiting long term care placement, greatly appreciate SW assistance. Patient remains medically stable for discharge, pending LTC placement. Unfortunately, still no bed offers.  Urinary Retention Likely bladder stretch injury. Foley catheter in place for decompression, will need it temporarily while bladder heals, currently on Day 13. Plan to keep in for 2-3 weeks. This is not a barrier to discharge.  Prior to Admission Living Arrangement: Memory care unit Anticipated Discharge Location: Long-term care Barriers to Discharge: Placement Dispo: Pending LTC placement  77, MD 04/21/2020, 10:56 AM Pager: [phone] After 5pm on weekdays and 1pm on weekends: On Call pager [phone]

## 2020-04-22 NOTE — Progress Notes (Signed)
   Subjective:  No acute events overnight.  Patient resting, appreciate nursing has tilted bed slightly to keep patient from sliding down. Patient answers yes, and okay to questions , attentive.   Objective:  Vital signs in last 24 hours: Vitals:   04/21/20 0531 04/21/20 1457 04/21/20 1600 04/21/20 2056  BP: 125/68 119/66 104/67 116/72  Pulse: 61 78 82 84  Resp: 20 17 14 18   Temp: 98.8 F (37.1 C) 97.9 F (36.6 C) 98.5 F (36.9 C) 99.2 F (37.3 C)  TempSrc: Oral Axillary Oral Axillary  SpO2: 99% 94% 97% 96%  Weight:       Physical Exam General: Middle-age man, pleasant affect, attentive to examiner, non sensical communication CV: Regular rate and rhythm, no murmurs rubs or gallops Pulmonary: Normal respiratory effort, no respiratory distress Abdomen: Soft, no grimace on palpation  Assessment/Plan:  Principal Problem:   End of life care Active Problems:   Alzheimer's disease (HCC)   Aggressive behavior   Behavioral and psychological symptoms of dementia [facility])  Hospital day #43 for Nathan Schroeder a 60 year old person with early onset Alzheimer's dementia who presented from memory care unit for agitation with rapid decline in mental and functional status. He is medically stable for discharge and awaiting LTC placement.   Early onset Alzheimer's dementia Patient awaiting long term care placement, greatly appreciate SW assistance. Patient remains medically stable for discharge, pending LTC placement.   Urinary Retention Likely bladder stretch injury. Foley catheter in place for decompression, will need it temporarily while bladder heals, currently on Day 14.  Plan to keep Foley catheter in at this time as do not want to elicit agitation.  Prior to Admission Living Arrangement: Memory care unit Anticipated Discharge Location: Long-term care Barriers to Discharge: Placement Dispo: Pending LTC placement  77, MD 04/22/2020, 7:16 AM Pager: [phone] After 5pm  on weekdays and 1pm on weekends: On Call pager [phone]

## 2020-04-22 NOTE — Plan of Care (Signed)
  Problem: Clinical Measurements: Goal: Diagnostic test results will improve Outcome: Progressing Goal: Cardiovascular complication will be avoided Outcome: Progressing   

## 2020-04-22 NOTE — Progress Notes (Signed)
Wife at bedside. Pt has been resting this morning. Does not appear to be in pain.

## 2020-04-23 NOTE — Progress Notes (Signed)
Nutrition Follow-up  DOCUMENTATION CODES:  Obesity unspecified  INTERVENTION:  Feeding assistance at all meal times; Feeding assistance will need to continue at facility post discharge. RD concerned that if pt not assisted/encouraged at meals then it is likely that he will not eat.  Continue [employer] as able; pt unable to verbalize food preferences.  Continue Ensure Enlive po TID, each supplement provides 350 kcal and 20 grams of protein.  Continue MVI with minerals.  NUTRITION DIAGNOSIS:  Inadequate oral intake related to decreased appetite as evidenced by meal completion < 50%. - being addressed with supplements and feeding assistance  GOAL:  Patient will meet greater than or equal to 90% of their needs - ongoing  MONITOR:  PO intake,Supplement acceptance,Labs,Weight trends,Skin,I & O's  REASON FOR ASSESSMENT:  Consult Assessment of nutrition requirement/status  ASSESSMENT:  Mr. Ghan is a 60 y/o male with a history of early onset alzheimer's disease presenting from memory care unit for aggressive behavior with rapid decline in he mental and functional status. Case manager finding placement. Pt medically ready to d/c.  Pt eating 0-100% of meals, 40-75% over the past 24 hours, per Epic. Spoke with nurse tech. Pt ate 100% of English muffin with eggs and bacon this morning as she cut it in half for him. She also reports that he will take most Ensures given to him, but may drink only 75% or so. She reports him being sleep this afternoon and late morning.  Per MD and nurse tech, still answering "yes" or "okay" to all questions. Pt a little combative today. Unable to get exam d/t this.  Relevant Medications: MVI with minerals Labs: reviewed  NUTRITION - FOCUSED PHYSICAL EXAM: Unable to complete  Diet Order:   Diet Order            DIET FINGER FOODS Room service appropriate? No; Fluid consistency: Thin  Diet effective now                EDUCATION NEEDS:  No  education needs have been identified at this time  Skin:  Skin Assessment: Reviewed RN Assessment  Last BM:  04/23/20 - Type 6, medium amount  Height:  Ht Readings from Last 1 Encounters:  09/22/19 6' (1.829 m)   Weight:  Wt Readings from Last 1 Encounters:  03/17/20 124.9 kg   Ideal Body Weight:  80.9 kg  BMI:  Body mass index is 37.34 kg/m.  Estimated Nutritional Needs:  Kcal:  2200-2400 Protein:  120-135 grams Fluid:  > 2 L  Vertell Limber, RD, LDN Registered Dietitian After Hours/Weekend Pager # in [place]

## 2020-04-23 NOTE — TOC Progression Note (Addendum)
Transition of Care [facility]) - Progression Note    Patient Details  Name: Nathan Schroeder MRN: [id number] Date of Birth: [birth year]  Transition of Care [facility]) CM/SW Contact  Janae Bridgeman, RN Phone Number: 04/23/2020, 1:27 PM  Clinical Narrative:    Case management and MSW are continuing to explore LTC facilities and private pay home health aides for home at this point, considering patient will need memory care.    CM called numerous home health agencies to establish cost for home health aide - ranges from $25.00 to $30.00 per hour depending on needs of the patient.  [facility] - unable to provide care in [place] area [facility] - $25 / hr - unable to provide 24 hr/day care - unable to provide in [facility] - checking rate for private aide [facility] - unable to provide care in [place] area [facility] - $26 - $30 / hr - available in [place], rate dependent after RN evaluation   CM and MSW will continue to follow for safe discharge plan - LTC placement (private) versus home with private pay home health aide.  04/23/2020 1500 - I called and spoke with Alcario Drought, CM with [facility] and they stated that they would be able to provide home health aide services to the patient / family if the family becomes medically stable enough to safely go home - considering the patient's behaviors.  Edwin Dada, MSW contacted the son to inquire which long term care insurance the patient was covered by and we will follow up with the medical provider and family to offer support as to a safe discharge plans for the patient.   Expected Discharge Plan: Long Term Nursing Home Barriers to Discharge: Barriers Resolved  Expected Discharge Plan and Services Expected Discharge Plan: Long Term Nursing Home In-house Referral: Clinical Social Work,Hospice / Palliative Care Discharge Planning Services: CM Consult Post Acute Care Choice: Home Health,Nursing  Home Living arrangements for the past 2 months: Assisted Living Facility                                       Social Determinants of Health (SDOH) Interventions    Readmission Risk Interventions No flowsheet data found.

## 2020-04-23 NOTE — Progress Notes (Signed)
   Subjective:  No acute overnight events.  Sleeping comfortably upon entry, but alert to voice. Mental status per baseline, responds "yes" or "okay" to all questions.   Objective:  Vital signs in last 24 hours: Vitals:   04/22/20 1405 04/22/20 1956 04/23/20 0429 04/23/20 1310  BP: 113/72 (!) 96/56 (!) 120/107 107/65  Pulse: (!) 57 65 63 (!) 56  Resp: 14 18 18 17   Temp: 98.2 F (36.8 C) 98.6 F (37 C) 98 F (36.7 C) 97.9 F (36.6 C)  TempSrc: Oral Oral Oral Oral  SpO2: 98% 99% 99% 98%  Weight:       Physical Exam General: calm elderly male, lying comfortably in bed, NAD. CV: normal rate and regular rhythm, no m/r/g. Pulmonary: Normal respiratory effort, no respiratory distress. Skin: warm and dry  Assessment/Plan:  Principal Problem:   End of life care Active Problems:   Alzheimer's disease (HCC)   Aggressive behavior   Behavioral and psychological symptoms of dementia [facility])  Hospital day #44 for Nathan Schroeder a 60 year old person with early onset Alzheimer's dementia who presented from memory care unit for agitation with rapid decline in mental and functional status. He is medically stable for discharge and awaiting LTC placement.   Early onset Alzheimer's dementia Patient awaiting long term care placement, greatly appreciate SW assistance. Patient remains medically stable for discharge, pending LTC placement. Unfortunately still no bed offers, but SW to see if patient can obtain 24/7 care at home.  Urinary Retention Likely bladder stretch injury. Foley catheter in place for decompression.  Plan to keep Foley catheter in at this time so as to not elicit agitation.  Prior to Admission Living Arrangement: Memory care unit Anticipated Discharge Location: Long-term care Barriers to Discharge: Placement Dispo: Pending LTC placement  77, MD 04/23/2020, 1:13 PM Pager: [phone] After 5pm on weekdays and 1pm on weekends: On Call pager [phone]

## 2020-04-23 NOTE — Plan of Care (Signed)
  Problem: Education: Goal: Knowledge of General Education information will improve Description Including pain rating scale, medication(s)/side effects and non-pharmacologic comfort measures Outcome: Progressing   

## 2020-04-23 NOTE — Progress Notes (Addendum)
CSW spoke with Nathan Schroeder at [facility] - due to the patient being 2+ assist, the facility cannot offer him a bed.  CSW unable to reach any staff at [facility].  CSW spoke with Nathan Schroeder at [facility] - the price per month for a private room is [phone].  CSW spoke with Nathan Schroeder at [facility] - the price per month is $8,190 (not including therapies or medications).  CSW spoke with [employer] representative to discuss patient's insurance benefits - representative states the policy will cover 89% per of SNF costs after the $500 deductible is met. The policy includes 60 visits from PT, OT, and SLP - a pre-authorization is required for admission to SNF.  CSW spoke with patient's wife to Nathan Schroeder to inform her of efforts. Nathan Schroeder provided CSW with permission to seek options for 24/7 care at home.  Nathan Schroeder, MSW, LCSW Transitions of Care  Clinical Social Worker II [phone]

## 2020-04-24 NOTE — Progress Notes (Signed)
Physical Therapy Treatment Patient Details Name: Nathan Schroeder MRN: [id number] DOB: 1960-11-12 Today's Date: 04/24/2020    History of Present Illness 60 y.o. male admitted on 03/10/20 for dementia with behavioral disturbance.  Pt from [facility].  Pt with significant PMH of HTN, anxiety, Alzheimer's disease.    PT Comments    Pt supine in bed this session.  He required max +2 to move to edge of bed and max +3 to maintain stance, weight shift and progress steps forward in sara + with foot plate removed.  Continue to recommend snf at this time.  Cognition continues to limit function.      Follow Up Recommendations  SNF     Equipment Recommendations  Hospital bed;Wheelchair (measurements PT);Wheelchair cushion (measurements PT)    Recommendations for Other Services       Precautions / Restrictions Precautions Precautions: Fall Precaution Comments: can become combative, however he was not this session. Restrictions Weight Bearing Restrictions: No    Mobility  Bed Mobility Overal bed mobility: Needs Assistance Bed Mobility: Supine to Sit     Supine to sit: Max assist;+2 for physical assistance     General bed mobility comments: Increased assistance to move patient to edge of bed.  Likely needing +2 due to cognition.    Transfers Overall transfer level: Needs assistance Equipment used: Ambulation equipment used (sara + with foot plate removed.) Transfers: Sit to/from Stand Sit to Stand: Mod assist;+2 physical assistance         General transfer comment: Cues for hand placement on B platforms.  required physical assistance to manage LEs in proper positioning to rise into standing with sit to stand lift equipement.  Upon standing noted with BM.  required total assistance for pericare.  Ambulation/Gait Ambulation/Gait assistance: Max assist;+2 safety/equipment;+2 physical assistance (+3, +2 to manage weight shifting and +3 to drive sara stedy forward  to progress gt.) Gait Distance (Feet): 15 Feet (before he stopped initiating steps then PTA returned to seated position.) Assistive device:  (sara + with foot plate removed.) Gait Pattern/deviations: Step-to pattern;Wide base of support;Trunk flexed;Decreased stride length     General Gait Details: Cues for sequencing and safety.  Physical assistance to progress steps forward and for weight shifting.  Pt fatigues quickly with first efforts of gt training.   Stairs             Wheelchair Mobility    Modified Rankin (Stroke Patients Only)       Balance Overall balance assessment: Needs assistance Sitting-balance support: Feet supported;No upper extremity supported Sitting balance-Leahy Scale: Poor       Standing balance-Leahy Scale: Poor                              Cognition Arousal/Alertness: Awake/alert Behavior During Therapy: Flat affect Overall Cognitive Status: History of cognitive impairments - at baseline                                 General Comments: Pt resistive to movement but able to participate in PT session.      Exercises      General Comments        Pertinent Vitals/Pain Pain Score: 0-No pain    Home Living                      Prior  Function            PT Goals (current goals can now be found in the care plan section) Acute Rehab PT Goals Patient Stated Goal: none stated PT Goal Formulation: Patient unable to participate in goal setting Potential to Achieve Goals: Fair Progress towards PT goals: Progressing toward goals    Frequency    Min 2X/week      PT Plan Current plan remains appropriate    Co-evaluation              AM-PAC PT "6 Clicks" Mobility   Outcome Measure  Help needed turning from your back to your side while in a flat bed without using bedrails?: A Lot Help needed moving from lying on your back to sitting on the side of a flat bed without using bedrails?: A  Lot Help needed moving to and from a bed to a chair (including a wheelchair)?: Total Help needed standing up from a chair using your arms (e.g., wheelchair or bedside chair)?: Total Help needed to walk in hospital room?: Total Help needed climbing 3-5 steps with a railing? : Total 6 Click Score: 8    End of Session Equipment Utilized During Treatment: Gait belt Activity Tolerance: Patient limited by fatigue;Other (comment) (cognition) Patient left: in chair;with call bell/phone within reach;with chair alarm set Nurse Communication: Mobility status PT Visit Diagnosis: Adult, failure to thrive (R62.7);Other abnormalities of gait and mobility (R26.89)     Time: 4481-8563 PT Time Calculation (min) (ACUTE ONLY): 28 min  Charges:  $Gait Training: 8-22 mins $Therapeutic Activity: 8-22 mins                     Bonney Leitz , PTA Acute Rehabilitation Services Pager [phone] Office [phone]     Kyleeann Cremeans Artis Delay 04/24/2020, 5:42 PM

## 2020-04-24 NOTE — Progress Notes (Signed)
CSW spoke with representative at [employer] - representative confirmed the patient had an active policy but would not discuss any details or benefits of the plan.  CSW spoke with patient's son Antoin to present him with information obtained from [facility] yesterday regarding 24/7 care at home - Saint agreeable to this option if he and his mother can confirm the care will be reliable and consistent.   TOC will further discuss with Frances Furbish and patient's family to coordinate a potential discharge plan.  Edwin Dada, MSW, LCSW Transitions of Care  Clinical Social Worker II [phone]

## 2020-04-24 NOTE — Progress Notes (Signed)
   Subjective:   No acute overnight events.  Resting comfortably upon entry. No new updates, medically stable for discharge.  Objective:  Vital signs in last 24 hours: Vitals:   04/23/20 0429 04/23/20 1310 04/23/20 2028 04/24/20 0538  BP: (!) 120/107 107/65 (!) 100/48 132/72  Pulse: 63 (!) 56 76 64  Resp: 18 17 18 18   Temp: 98 F (36.7 C) 97.9 F (36.6 C) 98.5 F (36.9 C) 98.2 F (36.8 C)  TempSrc: Oral Oral Oral Oral  SpO2: 99% 98% 100% 100%  Weight:       Physical Exam General: calm elderly male, sleeping comfortably in bed, NAD. CV: normal rate and regular rhythm, no m/r/g. Pulm: normal respiratory effort, no respiratory distress. Skin: warm and dry.  Assessment/Plan:  Principal Problem:   End of life care Active Problems:   Alzheimer's disease (HCC)   Aggressive behavior   Behavioral and psychological symptoms of dementia [facility])  Hospital day #45 for Nathan Schroeder a 60 year old person with early onset Alzheimer's dementia who presented from memory care unit for agitation with rapid decline in mental and functional status. He is medically stable for discharge and awaiting LTC placement.   Early onset Alzheimer's dementia Patient awaiting long term care placement, greatly appreciate SW assistance. Patient remains medically stable for discharge, pending LTC placement. 77 (home health services) to discuss with patient's family about cost and care needs, as per SW.   Urinary Retention Likely bladder stretch injury. Foley catheter in place for decompression.  Plan to keep Foley catheter in at this time so as to not elicit agitation.  Prior to Admission Living Arrangement: Memory care unit Anticipated Discharge Location: Long-term care Barriers to Discharge: Placement Dispo: Pending LTC placement  Frances Furbish, MD 04/24/2020, 12:25 PM Pager: [phone] After 5pm on weekdays and 1pm on weekends: On Call pager [phone]

## 2020-04-24 NOTE — TOC Progression Note (Signed)
Transition of Care [facility]) - Progression Note    Patient Details  Name: Nathan Schroeder MRN: [id number] Date of Birth: 12-31-1960  Transition of Care [facility]) CM/SW Contact  Janae Bridgeman, RN Phone Number: 04/24/2020, 10:41 AM  Clinical Narrative:    Case management spoke with Edwin Dada, MSW this morning and the family is willing to take the patient home with home health services privately through private pay aide.  The patient's family states that the previous home health company was not reliable with providing services.  I called and spoke with Janey Greaser, CM at [facility] to discuss family's need for in home aide.  [facility] plans to call the patient's son this morning to discuss cost and care needs for the family.  CM and MSW will continue to follow the patient for transitions of care needs.   Expected Discharge Plan: Long Term Nursing Home Barriers to Discharge: Barriers Resolved  Expected Discharge Plan and Services Expected Discharge Plan: Long Term Nursing Home In-house Referral: Clinical Social Work,Hospice / Palliative Care Discharge Planning Services: CM Consult Post Acute Care Choice: Home Health,Nursing Home Living arrangements for the past 2 months: Assisted Living Facility                                       Social Determinants of Health (SDOH) Interventions    Readmission Risk Interventions No flowsheet data found.

## 2020-04-25 MED ORDER — MEMANTINE HCL 5 MG PO TABS
5.0000 mg | ORAL_TABLET | Freq: Two times a day (BID) | ORAL | Status: DC
  Administered 2020-04-25 – 2020-04-28 (×5): 5 mg via ORAL
  Filled 2020-04-25 (×7): qty 1

## 2020-04-25 MED ORDER — SERTRALINE HCL 50 MG PO TABS
50.0000 mg | ORAL_TABLET | Freq: Every day | ORAL | Status: DC
  Administered 2020-04-26 – 2020-05-01 (×6): 50 mg via ORAL
  Filled 2020-04-25 (×6): qty 1

## 2020-04-25 MED ORDER — DIVALPROEX SODIUM ER 500 MG PO TB24
1000.0000 mg | ORAL_TABLET | Freq: Every day | ORAL | Status: DC
  Administered 2020-04-25 – 2020-05-13 (×18): 1000 mg via ORAL
  Filled 2020-04-25 (×20): qty 2

## 2020-04-25 MED ORDER — DONEPEZIL HCL 5 MG PO TABS
5.0000 mg | ORAL_TABLET | Freq: Every day | ORAL | Status: DC
  Administered 2020-04-25 – 2020-04-27 (×3): 5 mg via ORAL
  Filled 2020-04-25 (×3): qty 1

## 2020-04-25 NOTE — TOC Progression Note (Signed)
Transition of Care [facility]) - Progression Note    Patient Details  Name: Nathan Schroeder MRN: [id number] Date of Birth: May 28, 1960  Transition of Care [facility]) CM/SW Contact  Janae Bridgeman, RN Phone Number: 04/25/2020, 12:04 PM  Clinical Narrative:    Case management called and spoke with Caleen Jobs, CM at [facility], this morning to check on availability of private pay home health aide if patient is able to discharge home with family.  Graves stated that he was calling the patient's son this morning to discuss cost and arrangement of private pay home health aides at the home.  Presently, the patient does not have any acceptable bed offers for SNF placement.  CM and MSW with DTP team will continue to follow the patient for transitions of care needs.   Expected Discharge Plan: Home w Home Health Services Barriers to Discharge: Continued Medical Work up  Expected Discharge Plan and Services Expected Discharge Plan: Home w Home Health Services In-house Referral: Clinical Social Work,Hospice / Palliative Care Discharge Planning Services: CM Consult Post Acute Care Choice: Home Health,Nursing Home Living arrangements for the past 2 months: Assisted Living Facility                                       Social Determinants of Health (SDOH) Interventions    Readmission Risk Interventions No flowsheet data found.

## 2020-04-25 NOTE — Progress Notes (Signed)
   Subjective:   No acute overnight events.  Resting comfortably upon entry. Responding with "yes" to all questions.  Objective:  Vital signs in last 24 hours: Vitals:   04/23/20 1310 04/23/20 2028 04/24/20 0538 04/25/20 0545  BP: 107/65 (!) 100/48 132/72 125/75  Pulse: (!) 56 76 64 (!) 53  Resp: 17 18 18 15   Temp: 97.9 F (36.6 C) 98.5 F (36.9 C) 98.2 F (36.8 C) 98 F (36.7 C)  TempSrc: Oral Oral Oral Oral  SpO2: 98% 100% 100% 99%  Weight:       Physical Exam General: calm elderly male, sleeping comfortably in bed, NAD. Pulm: normal respiratory effort, no respiratory distress. Skin: warm and dry. Neuro: alert to voice, mental status per baseline.  Assessment/Plan:  Principal Problem:   End of life care Active Problems:   Alzheimer's disease (HCC)   Aggressive behavior   Behavioral and psychological symptoms of dementia [facility])  Hospital day #46 for Nathan Schroeder a 60 year old person with early onset Alzheimer's dementia who presented from memory care unit for agitation with rapid decline in mental and functional status. He is medically stable for discharge and awaiting LTC placement.   Early onset Alzheimer's dementia Patient awaiting long term care placement, greatly appreciate SW assistance. Patient remains medically stable for discharge, pending LTC placement. 77 (home health services) to discuss with patient's family about cost and care needs, as per SW. Optimized patient's medication list today to focus on only those necessary to manage symptoms and promote quality of life (weaning donepezil, memantine, sertraline; discontinued bupropion; changed depakote to extended release for convenience).  Urinary Retention Likely bladder stretch injury. Foley catheter in place for decompression.  Plan to keep Foley catheter in at this time so as to not elicit agitation.  Prior to Admission Living Arrangement: Memory care unit Anticipated Discharge Location: Long-term  care Barriers to Discharge: Placement Dispo: Pending LTC placement  Frances Furbish, MD 04/25/2020, 11:49 AM Pager: [phone] After 5pm on weekdays and 1pm on weekends: On Call pager [phone]

## 2020-04-25 NOTE — Progress Notes (Signed)
[facility] ([facility])      This patient has been referred for hospice services after discharge.  [facility] will continue to follow for any discharge planning needs and to coordinate admission onto hospice care, as appropriate.   Thank you for the opportunity to participate in this patient's care.     Chrislyn King, BSN, RN [facility] Hospital Liaison [phone]   [phone] (24h on call)     

## 2020-04-26 NOTE — Progress Notes (Signed)
Physical Therapy Treatment Patient Details Name: Nathan Schroeder MRN: [id number] DOB: Apr 29, 1960 Today's Date: 04/26/2020    History of Present Illness 60 y.o. male admitted on 03/10/20 for dementia with behavioral disturbance.  Pt from [facility].  Pt with significant PMH of HTN, anxiety, Alzheimer's disease.    PT Comments    Pt supine in bed on arrival this session.  He was pleasant but very flat.  Pt had one instance of agitation but able to be redirected with no combativeness.  Pt visibly fatigued post gt training in sara+ sit to stand lift.      Follow Up Recommendations  SNF     Equipment Recommendations  Hospital bed;Wheelchair (measurements PT);Wheelchair cushion (measurements PT)    Recommendations for Other Services       Precautions / Restrictions Precautions Precautions: Fall Precaution Comments: can become combative, he was agitated this session but able to be redirection. Restrictions Weight Bearing Restrictions: Yes    Mobility  Bed Mobility Overal bed mobility: Needs Assistance Bed Mobility: Supine to Sit;Sit to Supine     Supine to sit: Max assist;+2 for physical assistance     General bed mobility comments: Pt required assistance to facilitate movement to edge of bed.  He was able to maintain sitting edge of bed and calm for set up of sara + sit to stand lift.    Transfers Overall transfer level: Needs assistance Equipment used: Ambulation equipment used (sara + sit to stand) Transfers: Sit to/from Stand Sit to Stand: Mod assist;+2 physical assistance         General transfer comment: Cues for hand placement on B platforms.  required physical assistance to manage LEs in proper positioning to rise into standing with sit to stand lift equipement.  Pt more flexed in standing this session.  Ambulation/Gait Ambulation/Gait assistance: Mod assist;Max assist;+2 physical assistance;+2 safety/equipment (+3 two to manage patient weight  shifting and one to manage machine.  4th person for chair follow.) Gait Distance (Feet): 30 Feet Assistive device:  (sara + with foot plate removed.) Gait Pattern/deviations: Step-to pattern;Wide base of support;Trunk flexed;Decreased stride length     General Gait Details: Cues for sequencing and safety.  Physical assistance to progress steps forward and for weight shifting.  Pt able to progress distance but more flexed this session.  Performed standing rest break at tightened harness for more upright posture.  He participated in steps through out session.  No dragging/sliding of feet this session.   Stairs             Wheelchair Mobility    Modified Rankin (Stroke Patients Only)       Balance Overall balance assessment: Needs assistance Sitting-balance support: Feet supported;No upper extremity supported Sitting balance-Leahy Scale: Fair       Standing balance-Leahy Scale: Poor Standing balance comment: Heavy external assistance in standing frame.                            Cognition Arousal/Alertness: Awake/alert Behavior During Therapy: Flat affect Overall Cognitive Status: History of cognitive impairments - at baseline Area of Impairment: Problem solving;Awareness;Safety/judgement;Following commands;Memory;Attention;Orientation                 Orientation Level: Place;Time;Situation Current Attention Level: Selective Memory: Decreased recall of precautions;Decreased short-term memory Following Commands:  (Pt supine in bed on arrival.) Safety/Judgement: Decreased awareness of safety;Decreased awareness of deficits Awareness: Intellectual Problem Solving: Slow processing;Requires verbal cues;Requires  tactile cues;Decreased initiation General Comments: Pt resistive to movement but able to participate in PT session with use of sit to stand lift for stability.      Exercises      General Comments        Pertinent Vitals/Pain Pain  Assessment: Faces Faces Pain Scale: Hurts little more Pain Location: R shoulder Pain Descriptors / Indicators: Grimacing Pain Intervention(s): Monitored during session;Repositioned    Home Living                      Prior Function            PT Goals (current goals can now be found in the care plan section) Acute Rehab PT Goals Patient Stated Goal: none stated Potential to Achieve Goals: Fair Progress towards PT goals: Progressing toward goals    Frequency    Min 2X/week      PT Plan Current plan remains appropriate    Co-evaluation              AM-PAC PT "6 Clicks" Mobility   Outcome Measure  Help needed turning from your back to your side while in a flat bed without using bedrails?: A Lot Help needed moving from lying on your back to sitting on the side of a flat bed without using bedrails?: A Lot Help needed moving to and from a bed to a chair (including a wheelchair)?: A Lot Help needed standing up from a chair using your arms (e.g., wheelchair or bedside chair)?: A Lot Help needed to walk in hospital room?: A Lot Help needed climbing 3-5 steps with a railing? : Total 6 Click Score: 11    End of Session Equipment Utilized During Treatment: Gait belt Activity Tolerance: Patient limited by fatigue;Other (comment) (cognition) Patient left: in chair;with call bell/phone within reach;with chair alarm set Nurse Communication: Mobility status PT Visit Diagnosis: Adult, failure to thrive (R62.7);Other abnormalities of gait and mobility (R26.89)     Time: 2778-2423 PT Time Calculation (min) (ACUTE ONLY): 17 min  Charges:  $Gait Training: 8-22 mins                     Bonney Leitz , PTA Acute Rehabilitation Services Pager [phone] Office [phone]     Marnita Poirier Artis Delay 04/26/2020, 3:19 PM

## 2020-04-26 NOTE — Progress Notes (Signed)
CSW spoke with Whitney Post at [facility] - the private pay rate is far higher than the family's budget for placement.   Edwin Dada, MSW, LCSW Transitions of Care  Clinical Social Worker II [phone]

## 2020-04-26 NOTE — TOC Progression Note (Signed)
Transition of Care [facility]) - Progression Note    Patient Details  Name: Nathan Schroeder MRN: [id number] Date of Birth: October 18, 1960  Transition of Care [facility]) CM/SW Contact  Janae Bridgeman, RN Phone Number: 04/26/2020, 10:52 AM  Clinical Narrative:    Case management spoke with [facility] services and they are able to provide home health aide care to the family at 28-30 dollars per hour.  They offered up to 24 hour care at the home and I offered this to the patient's son, Nathan Schroeder, and he states that home with home health services would cost more then they can afford to provide the patient 24 hour care - in addition - it has required 3-4 staff members to provide ADL's to the patient including bath, bed change, gown changes and feeding.  The patient's son states that home with home health services would be unsafe for the patient, staff providing care and his mother.  The patient is nonambulatory and requires total care at this time.  CM and MSW will continue to explore LTC options at this time for discharge planning.    Expected Discharge Plan: Long Term Nursing Home Barriers to Discharge: Continued Medical Work up,Unsafe home situation  Expected Discharge Plan and Services Expected Discharge Plan: Long Term Nursing Home In-house Referral: Clinical Social Work Discharge Planning Services: CM Consult Post Acute Care Choice: Skilled Nursing Facility Living arrangements for the past 2 months: Assisted Living Facility                                       Social Determinants of Health (SDOH) Interventions    Readmission Risk Interventions No flowsheet data found.

## 2020-04-26 NOTE — Progress Notes (Signed)
   Subjective:   No acute overnight events.  Awake and interactive this AM. Still responding with "yes" or "okay" to all questions. No new updates today.  Objective:  Vital signs in last 24 hours: Vitals:   04/24/20 0538 04/25/20 0545 04/25/20 1500 04/26/20 0601  BP: 132/72 125/75 112/64 101/85  Pulse: 64 (!) 53 (!) 58 (!) 59  Resp: 18 15 16 16   Temp:  98 F (36.7 C) 97.7 F (36.5 C) 97.7 F (36.5 C)  TempSrc: Oral Oral Oral Tympanic  SpO2: 100% 99% 98% 99%  Weight:       Physical Exam General: calm elderly male, sitting up in bed, NAD. Pulm: normal respiratory effort, no respiratory distress. Skin: warm and dry. Neuro: awake and alert, mental status per baseline.  Assessment/Plan:  Principal Problem:   End of life care Active Problems:   Alzheimer's disease (HCC)   Aggressive behavior   Behavioral and psychological symptoms of dementia [facility])  Hospital day #47 for Nathan Schroeder a 60 year old person with early onset Alzheimer's dementia who presented from memory care unit for agitation with rapid decline in mental and functional status. He is medically stable for discharge and awaiting LTC placement.   Early onset Alzheimer's dementia Patient awaiting long term care placement, greatly appreciate SW assistance. Patient remains medically stable for discharge, pending LTC placement. 77 (home health services) to discuss with patient's family about cost and care needs, as per SW. Optimized patient's medication list yesterday, will wait a few days prior to further tapering/discontinuing of medications that will no longer promote quality of life (sertraline, memantine, donepezil).  Urinary Retention Likely bladder stretch injury. Foley catheter in place for decompression.  Plan to keep Foley catheter in at this time so as to not elicit agitation.  Prior to Admission Living Arrangement: Memory care unit Anticipated Discharge Location: Long-term care Barriers to Discharge:  Placement Dispo: Pending LTC placement  Frances Furbish, MD 04/26/2020, 7:55 AM Pager: [phone] After 5pm on weekdays and 1pm on weekends: On Call pager [phone]

## 2020-04-27 DIAGNOSIS — R339 Retention of urine, unspecified: Secondary | ICD-10-CM

## 2020-04-27 NOTE — TOC Progression Note (Addendum)
Transition of Care [facility]) - Progression Note    Patient Details  Name: Nathan Schroeder MRN: [id number] Date of Birth: [birth year]  Transition of Care [facility]) CM/SW Contact  Janae Bridgeman, RN Phone Number: 04/27/2020, 9:23 AM  Clinical Narrative:    Case management spoke with the patient's son yesterday on the phone and the son and wife, unfortunately, are requesting LTC placement instead of home with home health.  The son states that they are unable to safely care for the patient at the home.  The patient's family is willing to pay privately for LTC placement at an accepting facility.  The patient is fully vaccinated.  [facility] - No LTC beds available this week, [phone] / day, will need to send clinicals at a later date per facility  [facility] and [facility] - available LTC beds, semi-private $250 / day - sent clinicals to fax [phone]  [facility] in [place], [place] - placed in hub to review clinicals and left message with Sharia Reeve, CM at facility  [facility] in [place] - left msg on voicemail with [place], CM  CM and MSW will continue to explore admission beds for LTC placement.   Expected Discharge Plan: Long Term Nursing Home Barriers to Discharge: Continued Medical Work up,Unsafe home situation  Expected Discharge Plan and Services Expected Discharge Plan: Long Term Nursing Home In-house Referral: Clinical Social Work Discharge Planning Services: CM Consult Post Acute Care Choice: Skilled Nursing Facility Living arrangements for the past 2 months: Assisted Living Facility                                       Social Determinants of Health (SDOH) Interventions    Readmission Risk Interventions No flowsheet data found.

## 2020-04-27 NOTE — Progress Notes (Signed)
NT attempt to give patient CHG and foley care and pt push NT away, she attempt twice.

## 2020-04-27 NOTE — Progress Notes (Signed)
   Subjective:   No acute overnight events. Examined at bedside, he is alert and responds with "yes" to all questions, which is his baseline. Does not appear to be in acute pain or discomfort.   Objective:  Vital signs in last 24 hours: Vitals:   04/26/20 0601 04/26/20 1744 04/26/20 2000 04/27/20 0529  BP: 101/85 112/75 122/65 108/66  Pulse: (!) 59 75 73 63  Resp: 16 15 15 15   Temp: 97.7 F (36.5 C) 97.8 F (36.6 C) 98.4 F (36.9 C) 98.7 F (37.1 C)  TempSrc: Tympanic  Axillary Oral  SpO2: 99% 98% 99% 99%  Weight:       Physical Exam Vitals and nursing note reviewed.  Constitutional: no acute distress, eyes track appropriately but answers questions only with "yes" or "okay" Head: atraumatic ENT: external ears normal Eyes: EOMI Cardiovascular: regular rate and rhythm, normal heart sounds Pulmonary: effort normal, normal breath sounds bilaterally Abdominal: flat Skin: warm and dry Neurological: somnolent, unable to follow commands  Assessment/Plan:  Principal Problem:   End of life care Active Problems:   Alzheimer's disease (HCC)   Aggressive behavior   Behavioral and psychological symptoms of dementia [facility])  Nathan Schroeder is a 60 y/o M with hx of early on Alzheimer's dementia presenting from memory care unit for agitation with rapid decline in mental and functional status. He is medically stable for discharge and awaiting LTC placement.   Early onset Alzheimer's dementia Patient awaiting long term care placement, greatly appreciate SW assistance. Patient remains medically stable for discharge, pending LTC placement. Unfortunately, family do not feel able to care for him at home with [facility].  - tapering sertraline, memantine, donepezil as these will no longer promote quality of life at his current state -continue seroquel, depakote, ativan prn  Urinary Retention Likely has bladder stretch injury. Foley catheter placed on 3/14 for decompression, will require this for 2-3  weeks to allow healing. - voiding trial around 4/4, or sooner if placement is found    Prior to Admission Living Arrangement: Memory care unit Anticipated Discharge Location: Long-term care Barriers to Discharge: Placement Dispo: Pending LTC placement  6/4, MD 04/27/2020, 1:31 PM Pager: [phone] After 5pm on weekdays and 1pm on weekends: On Call pager [phone]

## 2020-04-27 NOTE — Progress Notes (Addendum)
CSW spoke with Kara Mead, case Production designer, theatre/television/film at [facility] at [phone] to discuss this patient's need for long term care - Kara Mead was unable to offer any additional solutions to address placement barriers.  CSW spoke with Melissa at [facility] to request a review of this patient - she will review referral and notify CSW with a decision.  Edwin Dada, MSW, LCSW Transitions of Care  Clinical Social Worker II [phone]

## 2020-04-28 MED ORDER — DONEPEZIL HCL 5 MG PO TABS
2.5000 mg | ORAL_TABLET | Freq: Every day | ORAL | Status: DC
  Administered 2020-04-28 – 2020-04-30 (×3): 2.5 mg via ORAL
  Filled 2020-04-28 (×3): qty 1

## 2020-04-28 MED ORDER — MEMANTINE HCL 5 MG PO TABS
2.5000 mg | ORAL_TABLET | Freq: Two times a day (BID) | ORAL | Status: DC
  Administered 2020-04-28 – 2020-05-01 (×6): 2.5 mg via ORAL
  Filled 2020-04-28 (×7): qty 1

## 2020-04-28 NOTE — Plan of Care (Signed)
  Problem: Education: Goal: Knowledge of General Education information will improve Description: Including pain rating scale, medication(s)/side effects and non-pharmacologic comfort measures Outcome: Progressing   Problem: Nutrition: Goal: Adequate nutrition will be maintained Outcome: Progressing   Problem: Pain Managment: Goal: General experience of comfort will improve Outcome: Progressing   Problem: Safety: Goal: Ability to remain free from injury will improve Outcome: Progressing   Problem: Skin Integrity: Goal: Risk for impaired skin integrity will decrease Outcome: Progressing   Problem: Coping: Goal: Ability to identify and develop effective coping behavior will improve Outcome: Progressing

## 2020-04-28 NOTE — Progress Notes (Signed)
   Subjective:   No acute overnight events.  This morning, became agitated while being rearranged in the bed and swung at a tech, but missed.  Quickly became calm again afterwards, continues to respond "yes" to all questions.  Does not follow commands. Does not appear to be in acute pain or discomfort.   Objective:  Vital signs in last 24 hours: Vitals:   04/27/20 0529 04/27/20 1510 04/27/20 2106 04/28/20 0607  BP: 108/66 123/73 120/77 125/82  Pulse: 63 67 70 65  Resp: 15 16 17 17   Temp: 98.7 F (37.1 C) 98.3 F (36.8 C) 98.1 F (36.7 C) 97.9 F (36.6 C)  TempSrc: Oral Axillary Axillary Axillary  SpO2: 99% 97% 97% 99%  Weight:       Physical Exam Vitals and nursing note reviewed.  Constitutional: no acute distress, eyes track appropriately but answers questions only with "yes" or "okay" Head: atraumatic ENT: external ears normal Eyes: EOMI Cardiovascular: regular rate and rhythm, normal heart sounds Pulmonary: effort normal, normal breath sounds bilaterally Abdominal: flat Skin: warm and dry Neurological: alert, calm, unable to follow commands  Assessment/Plan:  Principal Problem:   Alzheimer's disease (HCC) Active Problems:   Aggressive behavior   Behavioral and psychological symptoms of dementia (HCC)   End of life care  Nathan Schroeder is a 60 y/o M with hx of early on Alzheimer's dementia presenting from memory care unit for agitation with rapid decline in mental and functional status. He is medically stable for discharge and awaiting LTC placement.   Early onset Alzheimer's dementia Patient awaiting long term care placement, greatly appreciate SW assistance. Patient remains medically stable for discharge, pending LTC placement. Unfortunately, family do not feel able to care for him at home with [facility].  - tapering sertraline, memantine, donepezil as these will no longer promote quality of life at his current state     - sertraline reduced to 50mg  on 3/33     - donepezil to  2.5 on 4/2     - memantine to 2.5 on 4/2 -continue seroquel, depakote, ativan prn  Urinary Retention Likely has bladder stretch injury. Foley catheter placed on 3/14 for decompression, will require this for 2-3 weeks to allow healing. - voiding trial around 4/4, or sooner if placement is found   Prior to Admission Living Arrangement: Memory care unit Anticipated Discharge Location: Long-term care Barriers to Discharge: Placement Dispo: Pending LTC placement  4/14, MD 04/28/2020, 7:26 AM Pager: [phone] After 5pm on weekdays and 1pm on weekends: On Call pager [phone]

## 2020-04-29 NOTE — Progress Notes (Addendum)
   Subjective:   No acute overnight events.  Responds "yes" to questions. Does not appear to be in acute pain or discomfort.   Objective:  Vital signs in last 24 hours: Vitals:   04/28/20 0607 04/28/20 1352 04/28/20 2050 04/29/20 0523  BP: 125/82 100/65 106/66 (!) 91/37  Pulse: 65 88 82 64  Resp: 17 16 17 17   Temp: 97.9 F (36.6 C) 97.7 F (36.5 C) 97.9 F (36.6 C) 98 F (36.7 C)  TempSrc: Axillary Axillary Axillary   SpO2: 99% 96% 97% 99%  Weight:       Physical Exam Vitals and nursing note reviewed.  Constitutional: no acute distress, eyes track appropriately but answers questions only with "yes" or "okay" Head: atraumatic ENT: external ears normal Eyes: EOMI Cardiovascular: regular rate and rhythm, normal heart sounds Pulmonary: effort normal, normal breath sounds bilaterally Abdominal: flat Skin: warm and dry Neurological: alert, calm, unable to follow commands  Assessment/Plan:  Principal Problem:   Alzheimer's disease (HCC) Active Problems:   Aggressive behavior   Behavioral and psychological symptoms of dementia (HCC)   End of life care  Mr. Gurganus is a 60 y/o M with hx of early on Alzheimer's dementia presenting from memory care unit for agitation with rapid decline in mental and functional status. He is medically stable for discharge and awaiting LTC placement.   Early onset Alzheimer's dementia Patient awaiting long term care placement, greatly appreciate SW assistance. Patient remains medically stable for discharge, pending LTC placement. Unfortunately, family do not feel able to care for him at home with [facility].  - tapering sertraline, memantine, donepezil as these will no longer promote quality of life at his current state     - sertraline reduced to 50mg  on 3/33     - donepezil to 2.5 on 4/2     - memantine to 2.5 on 4/2 -continue seroquel, depakote, ativan prn  Urinary Retention Likely has bladder stretch injury. Foley catheter placed on 3/14 for  decompression, will require this for 2-3 weeks to allow healing. - voiding trial around 4/4, or sooner if placement is found   Prior to Admission Living Arrangement: Memory care unit Anticipated Discharge Location: Long-term care Barriers to Discharge: Placement, stable for discharge Dispo: Pending LTC placement  4/14, MD 04/29/2020, 6:57 AM Pager: [phone] After 5pm on weekdays and 1pm on weekends: On Call pager [phone]

## 2020-04-29 NOTE — Plan of Care (Signed)

## 2020-04-30 DIAGNOSIS — R451 Restlessness and agitation: Secondary | ICD-10-CM

## 2020-04-30 LAB — BASIC METABOLIC PANEL
Anion gap: 10 (ref 5–15)
BUN: 15 mg/dL (ref 8–23)
CO2: 25 mmol/L (ref 22–32)
Calcium: 9.2 mg/dL (ref 8.9–10.3)
Chloride: 103 mmol/L (ref 98–111)
Creatinine, Ser: 0.76 mg/dL (ref 0.61–1.24)
GFR, Estimated: >60 mL/min (ref >60)
Glucose, Bld: 81 mg/dL (ref 70–99)
Potassium: 4.1 mmol/L (ref 3.5–5.1)
Sodium: 138 mmol/L (ref 135–145)

## 2020-04-30 LAB — CBC
HCT: 45.4 % (ref 39.0–52.0)
Hemoglobin: 15.9 g/dL (ref 13.0–17.0)
MCH: 33.7 pg (ref 26.0–34.0)
MCHC: 35 g/dL (ref 30.0–36.0)
MCV: 96.2 fL (ref 80.0–100.0)
Platelets: 141 10*3/uL — ABNORMAL LOW (ref 150–400)
RBC: 4.72 MIL/uL (ref 4.22–5.81)
RDW: 13 % (ref 11.5–15.5)
WBC: 6.3 10*3/uL (ref 4.0–10.5)
nRBC: 0 % (ref 0.0–0.2)

## 2020-04-30 NOTE — TOC Progression Note (Addendum)
Transition of Care [facility]) - Progression Note    Patient Details  Name: [name] MRN: [id number] Date of Birth: 06-17-1960  Transition of Care [facility]) CM/SW Contact  Janae Bridgeman, RN Phone Number: 04/30/2020, 8:20 AM  Clinical Narrative:    Case management called and left a message with Allyson Sabal, CM at [facility] at [phone] to explore admission opportunities at the facility for LTC placement.  I also called and left a message with Nancy Fetter, CM at [facility] at [phone] to ask about available beds at the facility for placement.  CM and MSW will continue to follow the patient for LTC placement.  04/30/2020 - CM spoke with Jorge Ny at [facility] - re-faxed clinicals to [email or web address].  CM spoke with Frederik Pear, CM at [facility] and faxed clinicals to the facility to fax # [phone].  [facility] [employer] is 245 dollars / day at the facility.  Devviie, CM at the facility asked if the patient had any behaviors considering his Alzheimer's disease and clinicals would be reviewed by the director of nursing to determine if patient could be offered a bed at this time.  Edwin Dada, MSW updated the patient's son on the phone regarding costs of the [place] LTC facilities.  CM called and left a message with Eunice Blase, CM at [facility].  I called [facility] in [place] and was unable to leave a message with Alanda Amass, CM at the facility - voicemail was full.  [facility] - No LTC beds available at this time.  Cost is 381 dollars / day.   Expected Discharge Plan: Long Term Nursing Home Barriers to Discharge: Continued Medical Work up,Unsafe home situation  Expected Discharge Plan and Services Expected Discharge Plan: Long Term Nursing Home In-house Referral: Clinical Social Work Discharge Planning Services: CM Consult Post Acute Care  Choice: Skilled Nursing Facility Living arrangements for the past 2 months: Assisted Living Facility                                       Social Determinants of Health (SDOH) Interventions    Readmission Risk Interventions No flowsheet data found.

## 2020-04-30 NOTE — Progress Notes (Signed)
   Subjective:   No acute overnight events.  Resting comfortably this morning.  Objective:  Vital signs in last 24 hours: Vitals:   04/29/20 0523 04/29/20 0902 04/29/20 1340 04/30/20 0346  BP: (!) 91/37 98/65 91/67  129/70  Pulse: 64  (!) 59 78  Resp: 17  16 17   Temp: 98 F (36.7 C)  98 F (36.7 C) 97.7 F (36.5 C)  TempSrc:   Axillary Tympanic  SpO2: 99%  97% 99%  Weight:       Physical Exam Vitals and nursing note reviewed.  Constitutional: sleeping comfortably Head: atraumatic ENT: external ears normal Eyes: EOMI Cardiovascular: regular rate and rhythm, normal heart sounds Pulmonary: effort normal, normal breath sounds bilaterally Abdominal: flat Skin: warm and dry Neurological: sleeping  Assessment/Plan:  Principal Problem:   Alzheimer's disease (HCC) Active Problems:   Aggressive behavior   Behavioral and psychological symptoms of dementia (HCC)   End of life care  Nathan Schroeder is a 60 y/o M with hx of early on Alzheimer's dementia presenting from memory care unit for agitation with rapid decline in mental and functional status. He is medically stable for discharge and awaiting LTC placement.   Early onset Alzheimer's dementia Patient awaiting long term care placement, greatly appreciate SW assistance. Patient remains medically stable for discharge. Unfortunately, family do not feel able to care for him at home with [facility].  - tapering sertraline, memantine, donepezil as these will no longer promote quality of life at his current state     - sertraline reduced to 50mg  on 3/33     - donepezil to 2.5 on 4/2     - memantine to 2.5 on 4/2 -continue seroquel, depakote, ativan prn  Urinary Retention Likely has bladder stretch injury. Foley catheter placed on 3/14 for decompression. - voiding trial today, bladder scans scheduled   Prior to Admission Living Arrangement: Memory care unit Anticipated Discharge Location: Long-term care Barriers to Discharge: Placement,  stable for discharge Dispo: Pending LTC placement  6/2, MD 04/30/2020, 6:44 AM Pager: [phone] After 5pm on weekdays and 1pm on weekends: On Call pager [phone]

## 2020-04-30 NOTE — Progress Notes (Signed)
[facility] ([facility])      This patient has been referred for hospice services after discharge.  [facility] will continue to follow for any discharge planning needs and to coordinate admission onto hospice care, as appropriate.   Thank you for the opportunity to participate in this patient's care.     Chrislyn King, BSN, RN [facility] Hospital Liaison [phone]   [phone] (24h on call)     

## 2020-04-30 NOTE — Progress Notes (Signed)
Physical Therapy Treatment Patient Details Name: Nathan Schroeder MRN: [id number] DOB: Apr 25, 1960 Today's Date: 04/30/2020    History of Present Illness 60 y.o. male admitted on 03/10/20 for dementia with behavioral disturbance.  Pt from [facility].  Pt with significant PMH of HTN, anxiety, Alzheimer's disease.    PT Comments    Pt supine in bed on arrival.  He was more agitated this session then he has been.  Pt combative striking PTA x 2 on arm and back of head.  Pt performed shorter bouts of gt training due to his agitation.  Continue to recommend snf placement.     Follow Up Recommendations  SNF     Equipment Recommendations  Hospital bed;Wheelchair (measurements PT);Wheelchair cushion (measurements PT)    Recommendations for Other Services       Precautions / Restrictions Precautions Precautions: Fall Precaution Comments: agitated and combative this session. Restrictions Weight Bearing Restrictions: No    Mobility  Bed Mobility Overal bed mobility: Needs Assistance Bed Mobility: Supine to Sit;Sit to Supine     Supine to sit: Max assist;+2 for physical assistance     General bed mobility comments: Pt required assistance to facilitate movement to edge of bed.  Assistance to move LEs to edge of bed and elevate trunk into a seated position.  Pt combative with trunk assistance, striking PTA. Pt able to maintain sitting edge of bed and calm for set up of sara + sit to stand lift.    Transfers Overall transfer level: Needs assistance Equipment used: Ambulation equipment used (sara + sit to stand lift.) Transfers: Sit to/from Stand Sit to Stand: Mod assist;+2 physical assistance         General transfer comment: Cues for hand placement on B platforms.  required physical assistance to manage LEs in proper positioning to rise into standing with sit to stand lift equipement.  Pt more flexed in standing this session.  Required multiple sit to stand transfers  to readjust and align pt in sara stedy.  Ambulation/Gait Ambulation/Gait assistance: Max assist;+2 physical assistance Gait Distance (Feet): 15 Feet (x2 trials.) Assistive device:  (sara + with foot plate removed.) Gait Pattern/deviations: Step-to pattern;Wide base of support;Trunk flexed;Decreased stride length     General Gait Details: Cues for sequencing and safety.  Physical assistance to progress steps forward and for weight shifting.  Pt more resistive to gt training this session and more flexed in his posture.  Performed standing rest break at tightened harness for more upright posture.  He participated in steps intermittently with noted dragging of feer ( due to cognition )through out session.   Stairs             Wheelchair Mobility    Modified Rankin (Stroke Patients Only)       Balance Overall balance assessment: Needs assistance Sitting-balance support: Feet supported;No upper extremity supported Sitting balance-Leahy Scale: Fair       Standing balance-Leahy Scale: Poor Standing balance comment: Heavy external assistance in standing frame.                            Cognition Arousal/Alertness: Awake/alert Behavior During Therapy: Flat affect;Agitated (intermittenly agitated but mostly flat.) Overall Cognitive Status: Impaired/Different from baseline Area of Impairment: Problem solving;Awareness;Safety/judgement;Following commands;Memory;Attention;Orientation                 Orientation Level: Place;Time;Situation Current Attention Level: Selective Memory: Decreased recall of precautions;Decreased short-term memory Following  Commands:  (Pt supine in bed on arrival.) Safety/Judgement: Decreased awareness of safety;Decreased awareness of deficits Awareness: Intellectual Problem Solving: Slow processing;Requires verbal cues;Requires tactile cues;Decreased initiation General Comments: Pt very agitated and combative this session.       Exercises      General Comments        Pertinent Vitals/Pain Pain Assessment: Faces Faces Pain Scale: Hurts little more Pain Location: R shoulder Pain Descriptors / Indicators: Grimacing Pain Intervention(s): Monitored during session;Repositioned    Home Living                      Prior Function            PT Goals (current goals can now be found in the care plan section) Acute Rehab PT Goals Patient Stated Goal: none stated Potential to Achieve Goals: Fair Progress towards PT goals: Progressing toward goals    Frequency    Min 2X/week      PT Plan Current plan remains appropriate    Co-evaluation              AM-PAC PT "6 Clicks" Mobility   Outcome Measure  Help needed turning from your back to your side while in a flat bed without using bedrails?: A Lot Help needed moving from lying on your back to sitting on the side of a flat bed without using bedrails?: A Lot Help needed moving to and from a bed to a chair (including a wheelchair)?: A Lot Help needed standing up from a chair using your arms (e.g., wheelchair or bedside chair)?: A Lot Help needed to walk in hospital room?: A Lot Help needed climbing 3-5 steps with a railing? : Total 6 Click Score: 11    End of Session Equipment Utilized During Treatment: Gait belt Activity Tolerance: Patient limited by fatigue;Other (comment) (cognition/agitation/combativeness) Patient left: in chair;with call bell/phone within reach;with chair alarm set (sitter alarm belt in place.) Nurse Communication: Mobility status PT Visit Diagnosis: Adult, failure to thrive (R62.7);Other abnormalities of gait and mobility (R26.89)     Time: 8185-6314 PT Time Calculation (min) (ACUTE ONLY): 27 min  Charges:  $Gait Training: 8-22 mins $Therapeutic Activity: 8-22 mins                     Bonney Leitz , PTA Acute Rehabilitation Services Pager [phone] Office [phone]     Emonni Depasquale Artis Delay 04/30/2020,  3:20 PM

## 2020-04-30 NOTE — Progress Notes (Signed)
Nutrition Follow-up  DOCUMENTATION CODES:  Non-severe (moderate) malnutrition in context of chronic illness  INTERVENTION:  Continue Ensure Enlive po BID, each supplement provides 350 kcal and 20 grams of protein  Add Magic cup BID, each supplement provides 290 kcal and 9 grams of protein  Nursing staff to continue assisting pt with all meals (set up/feed)  Obtain new measured weight to assess for weight loss  NUTRITION DIAGNOSIS:  Inadequate oral intake related to decreased appetite as evidenced by meal completion < 50%.  Moderate protein-calorie malnutrition in the context of chronic illness (dementia) related to decreased ability to consume adequate nutrition as evidenced by pt consuming <75% of needs >1 month, mild muscle and fat deficits  GOAL:  Patient will meet greater than or equal to 90% of their needs  MONITOR:  PO intake,Supplement acceptance,Labs,Weight trends,Skin,I & O's  REASON FOR ASSESSMENT:  Consult Assessment of nutrition requirement/status  ASSESSMENT:  Mr. Popowski is a 60 y/o male with a history of early onset alzheimer's disease presenting from memory care unit for aggressive behavior with rapid decline in he mental and functional status.  Medications reviewed and include: namenda, receiving ensure enlive TID. Labs reviewed: No new labs since 3/26  Pt resting in bed at the time of assessment. Answers all questions with "yes or ok." Stable intake over the last week, averaging 45% intake x 11 recorded meals (ranges from 0-100% intake). Breakfast tray at bedside mostly untouched. Discussed intake with RN, states pt did eat ice cream this AM when administering medications. Reports she will continue to try and encourage intake and offer ensures. RN notes agitation today, but was able to perform some of physical exam. Noted no new weights have been taken since admission. Will request new measured weight to assess change since admission. CM continuing to search for  facilities for LTC placement. MD notes pt is medically stable for dc.   NUTRITION - FOCUSED PHYSICAL EXAM: Flowsheet Row Most Recent Value  Orbital Region No depletion  Upper Arm Region Mild depletion  Thoracic and Lumbar Region Unable to assess  Buccal Region No depletion  Temple Region No depletion  Clavicle Bone Region No depletion  Clavicle and Acromion Bone Region No depletion  Scapular Bone Region No depletion  Dorsal Hand Mild depletion  Patellar Region No depletion  Anterior Thigh Region Moderate depletion  Posterior Calf Region Mild depletion  Edema (RD Assessment) Unable to assess  Hair Reviewed  Eyes Unable to assess  Mouth Unable to assess  Skin Reviewed  [very dry and flaky]  Nails Unable to assess     Diet Order:   Diet Order            DIET FINGER FOODS Room service appropriate? No; Fluid consistency: Thin  Diet effective now                EDUCATION NEEDS:  No education needs have been identified at this time  Skin:  Skin Assessment: Reviewed RN Assessment (very dry and flaky)  Last BM:  4/4 per RN documentation, type 5  Height:  Ht Readings from Last 1 Encounters:  09/22/19 6' (1.829 m)   Weight:  Wt Readings from Last 5 Encounters:  03/17/20 124.9 kg  09/22/19 (!) 144.2 kg  04/13/19 (!) 145.6 kg  07/02/18 (!) 145.6 kg  12/22/17 (!) 145.3 kg   Ideal Body Weight:  80.9 kg  BMI:  Body mass index is 37.34 kg/m.  Estimated Nutritional Needs:   Kcal:  2200-2400  Protein:  120-135 grams  Fluid:  > 2 L  Greig Castilla, RD, LDN Clinical Dietitian Pager on Amion

## 2020-04-30 NOTE — Progress Notes (Addendum)
1244: Pt aggressive towards staff. RN unable to remove foley at this time. Tonye Royalty, MD aware. Will continue to monitor.   1646: Pt continues to be aggressive towards staff. RN unable to remove foley at this time. Tonye Royalty, MD aware. Will continue to monitor.

## 2020-04-30 NOTE — Plan of Care (Signed)

## 2020-05-01 DIAGNOSIS — E440 Moderate protein-calorie malnutrition: Secondary | ICD-10-CM | POA: Insufficient documentation

## 2020-05-01 MED ORDER — CLONAZEPAM 1 MG PO TABS: 1.0000 mg | ORAL_TABLET | Freq: Two times a day (BID) | ORAL | Status: DC

## 2020-05-01 MED ORDER — CLONAZEPAM 0.5 MG PO TABS
0.5000 mg | ORAL_TABLET | Freq: Two times a day (BID) | ORAL | Status: DC
  Administered 2020-05-01 – 2020-05-11 (×21): 0.5 mg via ORAL
  Filled 2020-05-01 (×21): qty 1

## 2020-05-01 MED ORDER — SERTRALINE HCL 50 MG PO TABS
75.0000 mg | ORAL_TABLET | Freq: Every day | ORAL | Status: DC
  Administered 2020-05-02 – 2020-05-28 (×26): 75 mg via ORAL
  Filled 2020-05-01 (×27): qty 2

## 2020-05-01 NOTE — Progress Notes (Signed)
   Subjective:   No acute overnight events except that nursing staff unable to do voiding trial due to behavior. Notably, he has not gotten any of his PRN medications and calms down quickly without them.  Resting comfortably this morning, responds "yes" to all questions.  Objective:  Vital signs in last 24 hours: Vitals:   04/30/20 0346 04/30/20 1348 04/30/20 2103 05/01/20 0610  BP: 129/70 125/72 124/70 115/66  Pulse: 78 68 60 (!) 57  Resp: 17 20 15 15   Temp: 97.7 F (36.5 C) 97.9 F (36.6 C) 97.7 F (36.5 C) (!) 97.5 F (36.4 C)  TempSrc: Tympanic Oral Axillary Oral  SpO2: 99% 94% 95% 100%  Weight:       Physical Exam Vitals and nursing note reviewed.  Constitutional: eyes track appropriately, responds "yes" to questions, follows instructions to breathe but no others Head: atraumatic ENT: external ears normal Eyes: EOMI Cardiovascular: regular rate and rhythm, normal heart sounds Pulmonary: effort normal, normal breath sounds bilaterally Abdominal: flat Skin: warm and dry  Assessment/Plan:  Principal Problem:   Alzheimer's disease (HCC) Active Problems:   Aggressive behavior   Behavioral and psychological symptoms of dementia (HCC)   End of life care   Agitation  Nathan Schroeder is a 60 y/o M with hx of early on Alzheimer's dementia presenting from memory care unit for agitation with rapid decline in mental and functional status. He is medically stable for discharge and awaiting LTC placement.   Early onset Alzheimer's dementia Patient awaiting long term care placement, greatly appreciate SW assistance. Patient remains medically stable for discharge. Unfortunately, family do not feel able to care for him at home with [facility].  - tapering sertraline, memantine, donepezil as these will no longer promote quality of life at his current state     - sertraline reduced to 50mg  on 3/33     - donepezil to 2.5 on 4/2     - memantine to 2.5 on 4/2 -continue seroquel, depakote, ativan  prn -appreciate TOC working hard on placement  Urinary Retention Likely has bladder stretch injury. Foley catheter placed on 3/14 for decompression. Attempted Foley trial but patient did not allow, will try again tomorrow.  Prior to Admission Living Arrangement: Memory care unit Anticipated Discharge Location: Long-term care Barriers to Discharge: Placement, stable for discharge. Though he is uncooperative with staff at times, he calms down quickly without requiring medications. Dispo: Pending LTC placement  6/2, MD 05/01/2020, 7:28 AM Pager: [phone] After 5pm on weekdays and 1pm on weekends: On Call pager [phone]
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
## 2020-05-02 NOTE — Progress Notes (Signed)
   Subjective:   No acute events overnight. Appears comfortable this morning. Responds to all questions with "yes."  Objective:  Vital signs in last 24 hours: Vitals:   04/30/20 2103 05/01/20 0610 05/01/20 1317 05/01/20 2139  BP: 124/70 115/66 104/68 119/75  Pulse: 60 (!) 57 64 61  Resp: 15 15 20    Temp: 97.7 F (36.5 C) (!) 97.5 F (36.4 C) 98.3 F (36.8 C) 98.3 F (36.8 C)  TempSrc: Axillary Oral Oral Oral  SpO2: 95% 100% 97% 99%  Weight:       Physical Exam Vitals and nursing note reviewed.  Constitutional: eyes track appropriately, responds "yes" to questions, follows instructions to breathe but no others Head: atraumatic ENT: external ears normal Eyes: EOMI Cardiovascular: regular rate and rhythm, normal heart sounds Pulmonary: effort normal, normal breath sounds bilaterally Abdominal: flat Skin: warm and dry  Assessment/Plan:  Principal Problem:   Alzheimer's disease (HCC) Active Problems:   Aggressive behavior   Behavioral and psychological symptoms of dementia (HCC)   End of life care   Agitation   Malnutrition of moderate degree  Mr. Messer is a 60 y/o M with hx of early on Alzheimer's dementia presenting from memory care unit for agitation with rapid decline in mental and functional status. He is medically stable for discharge and awaiting LTC placement.   Early onset Alzheimer's dementia Patient awaiting long term care placement, greatly appreciate SW assistance. Patient remains medically stable for discharge. Unfortunately, family do not feel able to care for him at home with [facility].  - tapering memantine, donepezil as these will no longer promote quality of life at his current state     - sertraline at 75mg      - donepezil stopped     - memantine stopped -continue seroquel, depakote, ativan prn -change Xanax to Klopopin for longer half life -appreciate TOC working hard on placement  Urinary Retention Likely has bladder stretch injury. Foley catheter  placed on 3/14 for decompression. Attempted Foley trial but patient did not allow.  Prior to Admission Living Arrangement: Memory care unit Anticipated Discharge Location: Long-term care Barriers to Discharge: Placement, stable for discharge. Though he is uncooperative with staff at times, he calms down quickly without requiring medications. Dispo: Pending LTC placement  , MD 05/02/2020, 6:34 AM Pager: [phone] After 5pm on weekdays and 1pm on weekends: On Call pager [phone]

## 2020-05-02 NOTE — Progress Notes (Signed)
Patient noted to have voided on bed since bed pad noted to be wet with urine and noted to have a smear BM when NT did incontinent care. Bladder scan was performed several times and noted 0 mL.  Patient was cooperative during the entire procedure.  Will continue to monitor.

## 2020-05-03 NOTE — Plan of Care (Signed)
?  Problem: Clinical Measurements: ?Goal: Will remain free from infection ?Outcome: Progressing ?  ?

## 2020-05-03 NOTE — Progress Notes (Signed)
   Subjective:   No acute events overnight. Appears comfortable this morning. Responds to all questions with "yes."  After discussing with Dr. Mayford Knife the risks and benefits of Foley, we decided to keep it after all, but it had already been removed. Again there are both risks and benefits to this, but he seems to be tolerating external catheter well at this time so will continue with this for now.  Objective:  Vital signs in last 24 hours: Vitals:   05/01/20 1317 05/01/20 2139 05/02/20 0645 05/02/20 2237  BP: 104/68 119/75 (!) 150/96 114/70  Pulse: 64 61 (!) 55 62  Resp: 20  16 17   Temp: 98.3 F (36.8 C) 98.3 F (36.8 C) 97.6 F (36.4 C) 97.7 F (36.5 C)  TempSrc: Oral Oral Oral Oral  SpO2: 97% 99% 100% 98%  Weight:       Physical Exam Vitals and nursing note reviewed.  Constitutional: eyes track appropriately, responds "yes" to questions, follows instructions to breathe but no others Head: atraumatic ENT: external ears normal Eyes: EOMI Cardiovascular: regular rate and rhythm, normal heart sounds Pulmonary: effort normal, normal breath sounds bilaterally Abdominal: flat Skin: warm and dry  Assessment/Plan:  Principal Problem:   Alzheimer's disease (HCC) Active Problems:   Aggressive behavior   Behavioral and psychological symptoms of dementia (HCC)   End of life care   Agitation   Malnutrition of moderate degree  Nathan Schroeder is a 60 y/o M with hx of early on Alzheimer's dementia presenting from memory care unit for agitation with rapid decline in mental and functional status. He is medically stable for discharge and awaiting LTC placement.   Early onset Alzheimer's dementia Patient awaiting long term care placement, greatly appreciate SW assistance. Patient remains medically stable for discharge. Unfortunately, family do not feel able to care for him at home with [facility].  - tapering memantine, donepezil as these will no longer promote quality of life at his current  state     - sertraline at 75mg      - donepezil stopped     - memantine stopped -continue seroquel, depakote, ativan prn -continue Klonopin BID -appreciate TOC working hard on placement  Urinary Retention Likely has bladder stretch injury. Foley catheter from 3/14 - 4/6 for decompression. Removed on 4/7, bladder scans benign and tolerating external catheter well.  Prior to Admission Living Arrangement: Memory care unit Anticipated Discharge Location: Long-term care Barriers to Discharge: Placement, stable for discharge. Though he is uncooperative with staff at times, he calms down quickly without requiring medications. Dispo: Pending LTC placement  12-15-2002, MD 05/03/2020, 1:06 PM Pager: [phone] After 5pm on weekdays and 1pm on weekends: On Call pager [phone]

## 2020-05-03 NOTE — Progress Notes (Signed)
Internal Medicine Attending Note:  I have seen and evaluated this patient and I have discussed the plan of care with the house staff. Please see their note for complete details. I concur with their findings.   Reymundo Poll, MD 05/03/2020, 3:20 PM

## 2020-05-03 NOTE — Progress Notes (Signed)
Physical Therapy Treatment Patient Details Name: Nathan Schroeder MRN: [id number] DOB: Jul 06, 1960 Today's Date: 05/03/2020    History of Present Illness 60 y.o. male admitted on 03/10/20 for dementia with behavioral disturbance.  Pt from [facility].  Pt with significant PMH of HTN, anxiety, Alzheimer's disease.    PT Comments    The pt was seen for progression of OOB mobility and functional mobility. Pt presenting without any violence or agitation today, but with increased restlessness with all attempted sit-stand transfers and gait using sara plus and +4 assist. The pt requires max/totalA of 2 to maintain upright in sara plus, needing max cues and manual facilitations to maintain hip extension and BUE on platforms. Pt also with noted difficulties advancing LLE with steps despite attempts at verbal cues, use of sara plus to initiate movement, and facilitations at pelvis. The pt required seated rest after only 5 ft ambulation at this time, will benefit from continued attempts to progress functional mobility and to improve independence with bed mobility and transfers.    Follow Up Recommendations  SNF (with memory care)     Equipment Recommendations  Hospital bed;Wheelchair (measurements PT);Wheelchair cushion (measurements PT)    Recommendations for Other Services       Precautions / Restrictions Precautions Precautions: Fall Precaution Comments: calm this session with some restlessness, but agitated in past Restrictions Weight Bearing Restrictions: No    Mobility  Bed Mobility Overal bed mobility: Needs Assistance Bed Mobility: Rolling;Sidelying to Sit Rolling: +2 for physical assistance;Max assist Sidelying to sit: Total assist;+2 for physical assistance;HOB elevated       General bed mobility comments: pt did not assist with any movement of BLE to EOB, and required totalA of 2 to elevate trunk from elevated HOB    Transfers Overall transfer level: Needs  assistance Equipment used: Ambulation equipment used (sara + sit to stand lift.) Transfers: Sit to/from Stand Sit to Stand: Max assist;+2 physical assistance;+2 safety/equipment         General transfer comment: cues for hand placement and maintaining grip on bilateral platforms. pt frequently removing, not bearing wt through shoulders or activating shoulders so max/totalA to complete stand with hip extension. maxA to maintain hip extension in standing.  Ambulation/Gait Ambulation/Gait assistance: Max assist;Total assist;+2 physical assistance;+2 safety/equipment (+4 (+2 for assit with pt, +1 for chair follow, +1 for management of sara plus)) Gait Distance (Feet): 5 Feet Assistive device:  (sara + with foot plate removed.) Gait Pattern/deviations: Step-to pattern;Decreased step length - left;Shuffle;Trunk flexed;Wide base of support Gait velocity: decreased   General Gait Details: cues for stepping, movement of sara to initiate stepping. poor advancement of LLE, also folding into trunk flexion without significant cues. totalA at times to maintain upright, positioning, and max encouragement to encourage posture, stepping, and positioning in sara plus         Balance Overall balance assessment: Needs assistance Sitting-balance support: Feet supported;No upper extremity supported Sitting balance-Leahy Scale: Poor Sitting balance - Comments: required modA of 1 to maintain sitting EOB Postural control: Right lateral lean;Posterior lean Standing balance support: Bilateral upper extremity supported;During functional activity Standing balance-Leahy Scale: Poor Standing balance comment: Heavy external assistance in standing frame.                            Cognition Arousal/Alertness: Awake/alert Behavior During Therapy: Flat affect;Agitated (restless and agitated at times with mobility, redirectable this session with no violent acts) Overall Cognitive  Status:  Impaired/Different from baseline Area of Impairment: Problem solving;Awareness;Safety/judgement;Following commands;Memory;Attention;Orientation                 Orientation Level: Disoriented to;Place;Situation;Time Current Attention Level: Selective Memory: Decreased recall of precautions;Decreased short-term memory Following Commands: Follows one step commands inconsistently;Follows one step commands with increased time Safety/Judgement: Decreased awareness of safety;Decreased awareness of deficits Awareness: Intellectual Problem Solving: Slow processing;Requires verbal cues;Requires tactile cues;Decreased initiation General Comments: pt with flat affect, answering "yes" to all questions this session. did not want to keep UE on platforms of sara plus, and became restless/agitated when attempting to correct position      Exercises      General Comments General comments (skin integrity, edema, etc.): Pt restless and agitated with attempts at mobility, flat affect when seated in recliner or bed      Pertinent Vitals/Pain Pain Assessment: Faces Faces Pain Scale: Hurts a little bit Pain Location: generalized grimacing, not able to verbalize any pain Pain Descriptors / Indicators: Grimacing Pain Intervention(s): Monitored during session;Repositioned           PT Goals (current goals can now be found in the care plan section) Acute Rehab PT Goals Patient Stated Goal: none stated PT Goal Formulation: Patient unable to participate in goal setting Time For Goal Achievement: 05/17/20 Potential to Achieve Goals: Fair Progress towards PT goals: Progressing toward goals    Frequency    Min 2X/week      PT Plan Current plan remains appropriate       AM-PAC PT "6 Clicks" Mobility   Outcome Measure  Help needed turning from your back to your side while in a flat bed without using bedrails?: A Lot Help needed moving from lying on your back to sitting on the side of a flat  bed without using bedrails?: A Lot Help needed moving to and from a bed to a chair (including a wheelchair)?: A Lot Help needed standing up from a chair using your arms (e.g., wheelchair or bedside chair)?: A Lot Help needed to walk in hospital room?: A Lot Help needed climbing 3-5 steps with a railing? : Total 6 Click Score: 11    End of Session Equipment Utilized During Treatment: Gait belt Activity Tolerance: Patient limited by fatigue Patient left: in chair;with call bell/phone within reach;with chair alarm set (posey belt) Nurse Communication: Mobility status PT Visit Diagnosis: Adult, failure to thrive (R62.7);Other abnormalities of gait and mobility (R26.89)     Time: 7482-7078 PT Time Calculation (min) (ACUTE ONLY): 32 min  Charges:  $Gait Training: 8-22 mins $Therapeutic Activity: 8-22 mins                     Rolm Baptise, PT, DPT   Acute Rehabilitation Department Pager #: [phone]   Gaetana Michaelis 05/03/2020, 11:13 AM

## 2020-05-03 NOTE — Progress Notes (Signed)
Patient noted this morning to have voided as noted on the bed pad (primofit not working for the patient) and a BM.  NT did incontinent care and patient cooperative during the care with the help of this RN.  Will endorse to day shift RN appropriately.

## 2020-05-03 NOTE — Progress Notes (Signed)
CSW sent clinical information to [place] at [facility] for review.  Edwin Dada, MSW, LCSW Transitions of Care  Clinical Social Worker II [phone]

## 2020-05-04 NOTE — Plan of Care (Signed)
  Problem: Clinical Measurements: Goal: Cardiovascular complication will be avoided Outcome: Progressing   

## 2020-05-04 NOTE — Progress Notes (Signed)
   Subjective:   Resting comfortably. Continues to be at his baseline. Seems to be more cooperative since medication changed a few days ago.  Objective:  Vital signs in last 24 hours: Vitals:   05/02/20 0645 05/02/20 2237 05/03/20 1518 05/03/20 2148  BP: (!) 150/96 114/70 126/66 (!) 101/48  Pulse: (!) 55 62 63 65  Resp: 16 17 18 18   Temp: 97.6 F (36.4 C) 97.7 F (36.5 C) 98 F (36.7 C) 97.6 F (36.4 C)  TempSrc: Oral Oral Oral Axillary  SpO2: 100% 98% 99% 98%  Weight:       Physical Exam Vitals and nursing note reviewed.  Constitutional: eyes track appropriately, responds "yes" to questions, follows instructions to breathe but no others Head: atraumatic ENT: external ears normal Eyes: EOMI Cardiovascular: regular rate and rhythm, normal heart sounds Pulmonary: effort normal, normal breath sounds bilaterally Abdominal: flat Skin: warm and dry  Assessment/Plan:  Principal Problem:   Alzheimer's disease (HCC) Active Problems:   Aggressive behavior   Behavioral and psychological symptoms of dementia (HCC)   End of life care   Agitation   Malnutrition of moderate degree  Nathan Schroeder is a 60 y/o M with hx of early on Alzheimer's dementia presenting from memory care unit for agitation with rapid decline in mental and functional status. He is medically stable for discharge and awaiting LTC placement.   Early onset Alzheimer's dementia Patient awaiting long term care placement, greatly appreciate SW assistance. Patient remains medically stable for discharge. Unfortunately, family do not feel able to care for him at home with [facility].  - tapering memantine, donepezil as these will no longer promote quality of life at his current state     - sertraline at 75mg      - donepezil stopped     - memantine stopped -continue seroquel, depakote, ativan prn -continue Klonopin BID -appreciate TOC working hard on placement  Urinary Retention Likely has bladder stretch injury. Foley  catheter from 3/14 - 4/6 for decompression. Removed on 4/7, bladder scans benign and tolerating external catheter well.  Prior to Admission Living Arrangement: Memory care unit Anticipated Discharge Location: Long-term care Barriers to Discharge: Placement, stable for discharge. Though he is uncooperative with staff at times, he calms down quickly without requiring medications. Dispo: Pending LTC placement  12-15-2002, MD 05/04/2020, 7:26 AM Pager: [phone] After 5pm on weekdays and 1pm on weekends: On Call pager [phone]

## 2020-05-04 NOTE — TOC Progression Note (Signed)
Transition of Care [facility]) - Progression Note    Patient Details  Name: Nathan Schroeder MRN: [id number] Date of Birth: 1960/06/15  Transition of [facility]) CM/SW Contact  Janae Bridgeman, RN Phone Number: 05/04/2020, 1:17 PM  Clinical Narrative:    Case management spoke with Frederik Pear, CM at [facility] in [place] [phone] and the facility received the clinicals for admission and is having  DON at the facility view the clinicals for possible admission.  CM and MSW continue to follow the patient for possible admission opportunities for LTC placement.   Expected Discharge Plan: Long Term Nursing Home Barriers to Discharge: Continued Medical Work up,Unsafe home situation  Expected Discharge Plan and Services Expected Discharge Plan: Long Term Nursing Home In-house Referral: Clinical Social Work Discharge Planning Services: CM Consult Post Acute Care Choice: Skilled Nursing Facility Living arrangements for the past 2 months: Assisted Living Facility                                       Social Determinants of Health (SDOH) Interventions    Readmission Risk Interventions No flowsheet data found.

## 2020-05-05 NOTE — Plan of Care (Signed)
  Problem: Education: Goal: Knowledge of General Education information will improve Description: Including pain rating scale, medication(s)/side effects and non-pharmacologic comfort measures Outcome: Progressing   Problem: Health Behavior/Discharge Planning: Goal: Ability to manage health-related needs will improve Outcome: Progressing   Problem: Activity: Goal: Risk for activity intolerance will decrease Outcome: Progressing   Problem: Nutrition: Goal: Adequate nutrition will be maintained Outcome: Progressing   Problem: Elimination: Goal: Will not experience complications related to urinary retention Outcome: Progressing   Problem: Pain Managment: Goal: General experience of comfort will improve Outcome: Progressing   Problem: Safety: Goal: Ability to remain free from injury will improve Outcome: Progressing   Problem: Skin Integrity: Goal: Risk for impaired skin integrity will decrease Outcome: Progressing   

## 2020-05-05 NOTE — Progress Notes (Signed)
   Subjective:   No acute overnight. Resting comfortably.   Objective:  Vital signs in last 24 hours: Vitals:   05/03/20 2148 05/04/20 1524 05/04/20 2009 05/05/20 0600  BP: (!) 101/48 (!) 109/59 102/80 114/72  Pulse: 65 64 79 (!) 59  Resp: 18  18 20   Temp: 97.6 F (36.4 C) 98.1 F (36.7 C) 98.2 F (36.8 C) 97.9 F (36.6 C)  TempSrc: Axillary  Axillary Axillary  SpO2: 98% 98% 97% 99%  Weight:       Physical Exam Vitals and nursing note reviewed.  Constitutional: eyes track appropriately, responds "yes" to questions, follows instructions to breathe but no others Head: atraumatic ENT: external ears normal Eyes: EOMI Cardiovascular: regular rate and rhythm, normal heart sounds Pulmonary: effort normal, normal breath sounds bilaterally Abdominal: flat Skin: warm and dry  Assessment/Plan:  Principal Problem:   Alzheimer's disease (HCC) Active Problems:   Aggressive behavior   Behavioral and psychological symptoms of dementia (HCC)   End of life care   Agitation   Malnutrition of moderate degree  [name] is a 60 y/o M with hx of early on Alzheimer's dementia presenting from memory care unit for agitation with rapid decline in mental and functional status. He is medically stable for discharge and awaiting LTC placement.   Early onset Alzheimer's dementia Patient awaiting long term care placement, greatly appreciate SW assistance. Patient remains medically stable for discharge.  - sertraline at 75mg  - continue seroquel, depakote, ativan prn - continue Klonopin BID - appreciate TOC working hard on placement  Urinary Retention Likely has bladder stretch injury. Foley catheter from 3/14 - 4/6 for decompression.   - Urinating without assistance at this time.   Prior to Admission Living Arrangement: Memory care unit Anticipated Discharge Location: Long-term care Barriers to Discharge: Placement, stable for discharge. Though he is uncooperative with staff at times, he  calms down quickly without requiring medications. Dispo: Pending LTC placement  Dr. 4/14 Internal Medicine PGY-2  Pager: [phone] After 5pm on weekdays and 1pm on weekends: On Call pager [phone]  05/05/2020, 6:52 AM

## 2020-05-06 NOTE — Discharge Summary (Signed)
Name: Nathan Schroeder MRN: [id number] DOB: Jun 14, 1960 60 y.o. PCP: Pcp, No  Date of Admission: 03/10/2020  9:26 AM Date of Discharge:  05/28/2020 Attending Physician: Gust Rung, DO   Discharge Diagnosis: 1. Early onset end-stage Alzheimer's dementia 2. Urinary retention with bladder injury, resolved  Discharge Medications: Allergies as of 05/28/2020   No Known Allergies     Medication List    STOP taking these medications   ALPRAZolam 1 MG tablet Commonly known as: XANAX   ALPRAZolam XR 1 MG 24 hr tablet Generic drug: ALPRAZolam   aspirin EC 81 MG tablet   buPROPion 300 MG 24 hr tablet Commonly known as: WELLBUTRIN XL   busPIRone 15 MG tablet Commonly known as: BUSPAR   divalproex 250 MG DR tablet Commonly known as: DEPAKOTE Replaced by: divalproex 125 MG capsule   donepezil 10 MG tablet Commonly known as: ARICEPT   furosemide 20 MG tablet Commonly known as: LASIX   lisinopril 20 MG tablet Commonly known as: ZESTRIL   memantine 10 MG tablet Commonly known as: NAMENDA   naproxen sodium 220 MG tablet Commonly known as: ALEVE   SENIOR MULTIVITAMIN PLUS PO   urea 20 % cream Commonly known as: CARMOL   Vitamin D (Ergocalciferol) 1.25 MG (50000 UNIT) Caps capsule Commonly known as: DRISDOL     TAKE these medications   divalproex 125 MG capsule Commonly known as: DEPAKOTE SPRINKLE Take 4 capsules (500 mg total) by mouth every 12 (twelve) hours. Replaces: divalproex 250 MG DR tablet   QUEtiapine 100 MG tablet Commonly known as: SEROQUEL Take 100 mg by mouth 2 (two) times daily.   sertraline 50 MG tablet Commonly known as: ZOLOFT Take 1.5 tablets (75 mg total) by mouth daily. What changed:   medication strength  how much to take       Disposition and follow-up:   Mr.[name] was discharged from [facility] in Stable condition.  At the hospital follow up visit please address:  1.  Follow up: [name] Early onset  Alzheimer's: Discharging to [facility] long-term care facility, minimized meds to thsoe that promote quality of life and comfort  2.  Labs / imaging needed at time of follow-up: None  3.  Pending labs/ test needing follow-up: None  Follow-up Appointments:   Hospital Course by problem list:  Early onset Alzheimer's Patient presented from memory care unit with worsening agitation and aggressive behavior.  He initially refused to eat and drink, but improved with family assistance and food that he enjoys, he feeds himself finger foods fairly well. Had goals of care discussion with family and palliative care, would like to avoid invasive procedures.  Ultimately decided that he was not yet eligible for hospice, but to follow-up at LTC facility as he declines.  Patient did have intermittent issues with agitation and aggressive behaviors during bathing and other such care, but did not require medications to calm down.  Medications optimized with aim to stop medications that were not beneficial given his current outlook, and behaviors improved with these changes. Ultimately discharged with sertraline, Seroquel, Depakote.  Had very prolonged hospital stay due to difficulty finding placement. Pt accepted to [facility] assisted living facility.  Urinary retention with bladder stretch injury During hospitalization he developed urinary retention with 1100 mL seen on bladder scan. Foley placed for decompression and left for 3 weeks to allow bladder to heal.  After removal, voided without difficulty and had benign bladder scans.  Subjective on  day of discharge: Pt at its mental baseline. Saying "Ya  And "Yes" to everything. Denies any complaints.   Discharge Exam:   BP 112/73 (BP Location: Left Arm)   Pulse 62   Temp 97.8 F (36.6 C)   Resp 16   Ht 6' (1.829 m)   Wt 99.8 kg   SpO2 100%   BMI 29.84 kg/m  Discharge exam: Physical Exam Vitals and nursing note reviewed.  Constitutional:       General: He is not in acute distress.    Appearance: Normal appearance. He is not ill-appearing, toxic-appearing or diaphoretic.  HENT:     Head: Normocephalic.  Cardiovascular:     Rate and Rhythm: Normal rate and regular rhythm.  Pulmonary:     Effort: Pulmonary effort is normal.  Neurological:     General: No focal deficit present.     Mental Status: He is alert. Mental status is at baseline.     Pertinent Labs, Studies, and Procedures:  CT Head Wo Contrast  Result Date: 03/10/2020 CLINICAL DATA:  Mental status change.  Patient with known dementia. EXAM: CT HEAD WITHOUT CONTRAST TECHNIQUE: Contiguous axial images were obtained from the base of the skull through the vertex without intravenous contrast. COMPARISON:  None. FINDINGS: Brain: No evidence of acute infarction, hemorrhage, hydrocephalus, extra-axial collection or mass lesion/mass effect. There is ventricular sulcal enlargement reflecting moderate diffuse atrophy, advanced for age. Mild periventricular white matter hypoattenuation is noted consistent with chronic microvascular ischemic change. Vascular: No hyperdense vessel or unexpected calcification. Skull: Normal. Negative for fracture or focal lesion. Sinuses/Orbits: Globes and orbits are unremarkable. Sinuses are clear. Other: None. IMPRESSION: 1. No acute intracranial abnormalities. 2. Moderate atrophy, advanced for age. Mild chronic microvascular ischemic change. Electronically Signed   By: Amie Portland M.D.   On: 03/10/2020 19:25   DG Chest Portable 1 View  Result Date: 03/10/2020 CLINICAL DATA:  Altered mental status. EXAM: PORTABLE CHEST 1 VIEW COMPARISON:  Cyst FINDINGS: The heart size and mediastinal contours are within normal limits. Low lung volumes. Both lungs are clear. The visualized skeletal structures are unremarkable. IMPRESSION: 1. No active disease. Electronically Signed   By: Obie Dredge M.D.   On: 03/10/2020 10:26    Discharge Instructions: Discharge  Instructions    Diet general   Complete by: As directed    Discharge instructions   Complete by: As directed    It has been a pleasure taking care of [name].  He was admitted for early onset Alzheimer's with agitation.  Discharging to long-term care facility.  He and his family has opted for comfort care, expected to have relatively long life span as he is eating well and no other major medical issues.       SignedKarsten Ro, MD 05/28/2020, 1:17 PM   On Call Pager: [phone]

## 2020-05-06 NOTE — Progress Notes (Signed)
   Subjective:   Patient remains at baseline mental status. No acute events overnight.   Objective:  Vital signs in last 24 hours: Vitals:   05/05/20 0600 05/05/20 1325 05/05/20 2123 05/06/20 0449  BP: 114/72 119/65 114/76 112/68  Pulse: (!) 59 89 66   Resp: 20 18 17    Temp: 97.9 F (36.6 C) 98.6 F (37 C) 98.4 F (36.9 C) 98 F (36.7 C)  TempSrc: Axillary Oral Oral Oral  SpO2: 99% 98% 99% 99%  Weight:       Physical Exam Vitals and nursing note reviewed.  Constitutional: eyes track appropriately, responds "yes" to questions, follows instructions to take deep breaths but no others Head: atraumatic ENT: external ears normal Eyes: EOMI Cardiovascular: regular rate and rhythm, normal heart sounds Pulmonary: effort normal, normal breath sounds bilaterally Abdominal: flat Skin: warm and dry  Assessment/Plan:  Principal Problem:   Alzheimer's disease (HCC) Active Problems:   Aggressive behavior   Behavioral and psychological symptoms of dementia (HCC)   End of life care   Agitation   Malnutrition of moderate degree  Nathan Schroeder is a 60 y/o M with hx of early on Alzheimer's dementia presenting from memory care unit for agitation with rapid decline in mental and functional status. He is medically stable for discharge and awaiting LTC placement.   Early onset Alzheimer's dementia Patient awaiting long term care placement, greatly appreciate SW assistance. Patient remains medically stable for discharge.  - continue sertraline, seroquel, depakote, ativan prn - continue Klonopin BID - appreciate TOC working hard on placement  Urinary Retention Likely has bladder stretch injury. Foley catheter from 3/14 - 4/6 for decompression. No issues with voiding since then. - Urinating without assistance at this time.   Prior to Admission Living Arrangement: Memory care unit Anticipated Discharge Location: Long-term care Barriers to Discharge: Placement, stable for discharge. Has been  more cooperative with recent medication changes Dispo: Pending LTC placement  12-15-2002, MD, PGY-1 Internal Medicine Paged: [phone] After 5pm on weekdays and 1pm on weekends: On Call pager [phone]  05/06/2020, 6:57 AM

## 2020-05-06 NOTE — Progress Notes (Addendum)
Pt found in sitting position on floor mat beside bed. No injuries noted. Security at bedside to assist. Pt back to bed via maximove. Pt's daughter, Mardella Layman, made aware. Dr. Imogene Burn made aware. Bed alarm on. Will continue to monitor.

## 2020-05-06 NOTE — Plan of Care (Signed)

## 2020-05-06 NOTE — Progress Notes (Signed)
   05/06/20 1420  What Happened  Was fall witnessed? No  Was patient injured? No  Patient found on floor  Found by Staff-comment Merrilyn Puma, RN)  Stated prior activity other (comment) (pt could not state reason)  Follow Up  MD notified Tonye Royalty, MD  Time MD notified [phone]  Family notified Yes - comment Mardella Layman, pt's daughter)  Time family notified 1422  Additional tests No  Simple treatment Other (comment) (pt assisted back to bed via maximove with warm blanket)  Progress note created (see row info) Yes  Adult Fall Risk Assessment  Risk Factor Category (scoring not indicated) Fall has occurred during this admission (document High fall risk) (05/06/2020)  Patient Fall Risk Level High fall risk  Adult Fall Risk Interventions  Required Bundle Interventions *See Row Information* High fall risk - low, moderate, and high requirements implemented  Additional Interventions PT/OT need assessed if change in mobility from baseline;Room near nurses station;Use of appropriate toileting equipment (bedpan, BSC, etc.)  Screening for Fall Injury Risk (To be completed on HIGH fall risk patients) - Assessing Need for Floor Mats  Risk For Fall Injury- Criteria for Floor Mats Confusion/dementia (+NuDESC, CIWA, TBI, etc.);Noncompliant with safety precautions  Will Implement Floor Mats Yes  Pain Assessment  Pain Scale PAINAD  Pain Score 0  Faces Pain Scale 0  PAINAD (Pain Assessment in Advanced Dementia)  Breathing 0  Negative Vocalization 0  Facial Expression 0  Body Language 0  Consolability 0  PAINAD Score 0

## 2020-05-07 NOTE — NC FL2 (Signed)
[place] MEDICAID FL2 LEVEL OF CARE SCREENING TOOL     IDENTIFICATION  Patient Name: Nathan Schroeder Birthdate: 10/26/1960 Sex: male Admission Date (Current Location): 03/10/2020  [facility] and [place] Number:  Producer, television/film/video and Address:  [facility], [address], [place], [place] [address]      Provider Number: [id number]  Attending Physician Name and Address:  Reymundo Poll, MD  Relative Name and Phone Number:  Gyasi Hazzard [phone]    Current Level of Care: Hospital Recommended Level of Care: Assisted Living [facility] Care Prior Approval Number:    Date Approved/Denied:   PASRR Number: [id number]  Discharge Plan: Other (Comment) (Assisted Living Facility)    Current Diagnoses: Patient Active Problem List   Diagnosis Date Noted  . Malnutrition of moderate degree 05/01/2020  . Agitation   . End of life care 03/23/2020  . Alzheimer's disease (HCC)   . Aggressive behavior   . Behavioral and psychological symptoms of dementia (HCC)   . Ventral hernia without obstruction or gangrene 04/13/2019  . Tinea pedis of both feet 04/13/2019  . Iron excess 04/13/2019  . Hyperlipidemia LDL goal <100 04/13/2019  . Localized swelling of both lower legs 04/13/2019  . BMI 40.0-44.9, adult (HCC) 04/13/2019  . Frequent urination 04/13/2019  . Mild cognitive impairment with memory loss 02/03/2013  . Hypertension 07/19/2012  . Memory loss 07/19/2012    Orientation RESPIRATION BLADDER Height & Weight      (Flucuating orientation, patient with dementia)  Normal Incontinent Weight: 275 lb 5.7 oz (124.9 kg) Height:     BEHAVIORAL SYMPTOMS/MOOD NEUROLOGICAL BOWEL NUTRITION STATUS   (Due to Alzheimer's can swat if approached in a "threatening way.")   Incontinent Diet (Regular)  AMBULATORY STATUS COMMUNICATION OF NEEDS Skin   Extensive Assist Verbally Normal                       Personal Care Assistance Level of Assistance   Bathing,Feeding,Dressing Bathing Assistance: Maximum assistance Feeding assistance: Maximum assistance Dressing Assistance: Maximum assistance     Functional Limitations Info  Sight,Hearing,Speech Sight Info: Adequate Hearing Info: Adequate Speech Info: Adequate    SPECIAL CARE FACTORS FREQUENCY  PT (By licensed PT),OT (By licensed OT)     PT Frequency: 5 x weekly OT Frequency: 5x weekly            Contractures Contractures Info: Not present    Additional Factors Info  Code Status,Allergies,Psychotropic Code Status Info: DNR Allergies Info: None Psychotropic Info: Klonapin, Depakote, Seroquel, Zoloft, Aricept         Current Medications (05/07/2020):  This is the current hospital active medication list Current Facility-Administered Medications  Medication Dose Route Frequency Provider Last Rate Last Admin  . acetaminophen (TYLENOL) tablet 650 mg  650 mg Oral Q6H PRN Rehman, Areeg N, DO       Or  . acetaminophen (TYLENOL) suppository 650 mg  650 mg Rectal Q6H PRN Rehman, Areeg N, DO      . antiseptic oral rinse (BIOTENE) solution 15 mL  15 mL Topical PRN Rehman, Areeg N, DO      . clonazePAM (KLONOPIN) tablet 0.5 mg  0.5 mg Oral BID Remo Lipps, MD   0.5 mg at 05/07/20 0842  . divalproex (DEPAKOTE ER) 24 hr tablet 1,000 mg  1,000 mg Oral Daily Merrilyn Puma, MD   1,000 mg at 05/07/20 0842  . enoxaparin (LOVENOX) injection 40 mg  40 mg Subcutaneous Q24H  Merrilyn Puma, MD   40 mg at 05/07/20 [phone]  . feeding supplement (ENSURE ENLIVE / ENSURE PLUS) liquid 237 mL  237 mL Oral TID BM Tyson Alias, MD   237 mL at 05/07/20 1259  . LORazepam (ATIVAN) 2 MG/ML concentrated solution 1 mg  1 mg Oral Q4H PRN Girard Cooter M, NP      . ondansetron (ZOFRAN-ODT) disintegrating tablet 4 mg  4 mg Oral Q6H PRN Rehman, Areeg N, DO       Or  . ondansetron (ZOFRAN) injection 4 mg  4 mg Intravenous Q6H PRN Rehman, Areeg N, DO      . polyvinyl alcohol (LIQUIFILM TEARS) 1.4 %  ophthalmic solution 1 drop  1 drop Both Eyes QID PRN Rehman, Areeg N, DO      . QUEtiapine (SEROQUEL) tablet 100 mg  100 mg Oral BID Rehman, Areeg N, DO   100 mg at 05/07/20 0843  . sertraline (ZOLOFT) tablet 75 mg  75 mg Oral Daily Remo Lipps, MD   75 mg at 05/07/20 9774     Discharge Medications: Please see discharge summary for a list of discharge medications.  Relevant Imaging Results:  Relevant Lab Results:   Additional Information SSN: [id number]  Inis Sizer, LCSW

## 2020-05-07 NOTE — NC FL2 (Deleted)
[place] MEDICAID FL2 LEVEL OF CARE SCREENING TOOL     IDENTIFICATION  Patient Name: Nathan Schroeder Birthdate: 01/23/1961 Sex: male Admission Date (Current Location): 03/10/2020  County and Medicaid Number:  [place]   Facility and Address:  [facility] [place], [address], [place], [place] [address]      Provider Number: [id number]  Attending Physician Name and Address:  Guilloud, Carolyn, MD  Relative Name and Phone Number:  Milind Giesler [phone]    Current Level of Care: Hospital Recommended Level of Care: Assisted Living Facility,Memory Care Prior Approval Number:    Date Approved/Denied:   PASRR Number: [id number]  Discharge Plan: Other (Comment) (Assisted Living Facility)    Current Diagnoses: Patient Active Problem List   Diagnosis Date Noted  . Malnutrition of moderate degree 05/01/2020  . Agitation   . End of life care 03/23/2020  . Alzheimer's disease (HCC)   . Aggressive behavior   . Behavioral and psychological symptoms of dementia (HCC)   . Ventral hernia without obstruction or gangrene 04/13/2019  . Tinea pedis of both feet 04/13/2019  . Iron excess 04/13/2019  . Hyperlipidemia LDL goal <100 04/13/2019  . Localized swelling of both lower legs 04/13/2019  . BMI 40.0-44.9, adult (HCC) 04/13/2019  . Frequent urination 04/13/2019  . Mild cognitive impairment with memory loss 02/03/2013  . Hypertension 07/19/2012  . Memory loss 07/19/2012    Orientation RESPIRATION BLADDER Height & Weight      (Flucuating orientation, patient with dementia)  Normal Incontinent Weight: 275 lb 5.7 oz (124.9 kg) Height:     BEHAVIORAL SYMPTOMS/MOOD NEUROLOGICAL BOWEL NUTRITION STATUS   (Due to Alzheimer's can swat if approached in a "threatening way.")   Incontinent Diet (Regular)  AMBULATORY STATUS COMMUNICATION OF NEEDS Skin   Extensive Assist Verbally Normal                       Personal Care Assistance Level of Assistance   Bathing,Feeding,Dressing Bathing Assistance: Maximum assistance Feeding assistance: Maximum assistance Dressing Assistance: Maximum assistance     Functional Limitations Info  Sight,Hearing,Speech Sight Info: Adequate Hearing Info: Adequate Speech Info: Adequate    SPECIAL CARE FACTORS FREQUENCY  PT (By licensed PT),OT (By licensed OT)     PT Frequency: 5 x weekly OT Frequency: 5x weekly            Contractures Contractures Info: Not present    Additional Factors Info  Code Status,Allergies,Psychotropic Code Status Info: DNR Allergies Info: None Psychotropic Info: Klonapin, Depakote, Seroquel, Zoloft, Aricept         Current Medications (05/07/2020):  This is the current hospital active medication list Current Facility-Administered Medications  Medication Dose Route Frequency Provider Last Rate Last Admin  . acetaminophen (TYLENOL) tablet 650 mg  650 mg Oral Q6H PRN Rehman, Areeg N, DO       Or  . acetaminophen (TYLENOL) suppository 650 mg  650 mg Rectal Q6H PRN Rehman, Areeg N, DO      . antiseptic oral rinse (BIOTENE) solution 15 mL  15 mL Topical PRN Rehman, Areeg N, DO      . clonazePAM (KLONOPIN) tablet 0.5 mg  0.5 mg Oral BID Chen, Joshua Y, MD   0.5 mg at 05/07/20 0842  . divalproex (DEPAKOTE ER) 24 hr tablet 1,000 mg  1,000 mg Oral Daily Jinwala, Sagar, MD   1,000 mg at 05/07/20 0842  . enoxaparin (LOVENOX) injection 40 mg  40 mg Subcutaneous Q24H   Merrilyn Puma, MD   40 mg at 05/07/20 [phone]  . feeding supplement (ENSURE ENLIVE / ENSURE PLUS) liquid 237 mL  237 mL Oral TID BM Tyson Alias, MD   237 mL at 05/07/20 1259  . LORazepam (ATIVAN) 2 MG/ML concentrated solution 1 mg  1 mg Oral Q4H PRN Girard Cooter M, NP      . ondansetron (ZOFRAN-ODT) disintegrating tablet 4 mg  4 mg Oral Q6H PRN Rehman, Areeg N, DO       Or  . ondansetron (ZOFRAN) injection 4 mg  4 mg Intravenous Q6H PRN Rehman, Areeg N, DO      . polyvinyl alcohol (LIQUIFILM TEARS) 1.4 %  ophthalmic solution 1 drop  1 drop Both Eyes QID PRN Rehman, Areeg N, DO      . QUEtiapine (SEROQUEL) tablet 100 mg  100 mg Oral BID Rehman, Areeg N, DO   100 mg at 05/07/20 0843  . sertraline (ZOLOFT) tablet 75 mg  75 mg Oral Daily Remo Lipps, MD   75 mg at 05/07/20 9774     Discharge Medications: Please see discharge summary for a list of discharge medications.  Relevant Imaging Results:  Relevant Lab Results:   Additional Information SSN: [id number]  Inis Sizer, LCSW

## 2020-05-07 NOTE — Progress Notes (Signed)
   Subjective:   Yesterday afternoon was found sitting on the soft mat next to his bed. No clear injury and patient did not seem to be upset or in pain. Discussed with family.  This morning, patient remains at his baseline.  Responds "hey" and "yes" to questions.  No apparent discomfort.  Objective:  Vital signs in last 24 hours: Vitals:   05/05/20 2123 05/06/20 0449 05/06/20 1445 05/07/20 0534  BP: 114/76 112/68 115/83 113/69  Pulse: 66  67 (!) 52  Resp: 17  19 16   Temp:  98 F (36.7 C)  (!) 97.5 F (36.4 C)  TempSrc: Oral Oral  Tympanic  SpO2: 99% 99% 99% 98%  Weight:       Physical Exam Vitals and nursing note reviewed.  Constitutional: eyes track appropriately, responds "yes" to questions Head: atraumatic ENT: external ears normal Eyes: EOMI Cardiovascular: regular rate and rhythm, normal heart sounds Pulmonary: effort normal, normal breath sounds bilaterally Abdominal: flat Skin: warm and dry  Assessment/Plan:  Principal Problem:   Alzheimer's disease (HCC) Active Problems:   Aggressive behavior   Behavioral and psychological symptoms of dementia (HCC)   End of life care   Agitation   Malnutrition of moderate degree  Nathan Schroeder is a 60 y/o M with hx of early on Alzheimer's dementia presenting from memory care unit for agitation with rapid decline in mental and functional status. He is medically stable for discharge and awaiting LTC placement.   Early onset Alzheimer's dementia Patient awaiting long term care placement, greatly appreciate SW assistance. Patient remains medically stable for discharge.  - continue sertraline, seroquel, depakote, ativan prn - continue Klonopin BID - appreciate TOC working hard on placement  Urinary Retention Likely has bladder stretch injury. Foley catheter from 3/14 - 4/6 for decompression. No issues with voiding since then. - Urinating without assistance at this time.   Prior to Admission Living Arrangement: Memory care  unit Anticipated Discharge Location: Long-term care Barriers to Discharge: Placement, stable for discharge. Has been more cooperative with recent medication changes Dispo: Pending LTC placement  12-15-2002, MD, PGY-1 Internal Medicine Paged: [phone] After 5pm on weekdays and 1pm on weekends: On Call pager [phone]  05/07/2020, 8:02 AM

## 2020-05-07 NOTE — Progress Notes (Addendum)
CSW spoke with Ed, admissions coordinator at [facility] in [place] who is agreeable to review referral. CSW sent referral with clinical information via secure email to [email or web address]  CSW spoke with Isabelle Course, admissions coordinator at [facility] in [place] who is agreeable to review referral. CSW sent referral with clinical information via secure email to [email or web address]  Edwin Dada, MSW, LCSW Transitions of Care  Clinical Social Worker II [phone]

## 2020-05-08 NOTE — Progress Notes (Addendum)
   Subjective:   Remains at baseline mental status. No acute events overnight. No signs of distress or discomfort.  Objective:  Vital signs in last 24 hours: Vitals:   05/07/20 0534 05/07/20 1451 05/07/20 1937 05/08/20 0434  BP: 113/69 136/73 125/67 (!) 128/58  Pulse: (!) 52 61 70 82  Resp: 16 18 17 18   Temp: (!) 97.5 F (36.4 C) 97.6 F (36.4 C) 98 F (36.7 C) 97.8 F (36.6 C)  TempSrc: Tympanic Oral Axillary Axillary  SpO2: 98% 99% 98% 99%  Weight:       Physical Exam Vitals and nursing note reviewed.  Constitutional: eyes track appropriately, responds "yes" to questions, somnolent Head: atraumatic ENT: external ears normal Eyes: EOMI Cardiovascular: regular rate and rhythm, normal heart sounds Pulmonary: effort normal, normal breath sounds bilaterally Abdominal: flat Skin: warm and dry  Assessment/Plan:  Principal Problem:   Alzheimer's disease (HCC) Active Problems:   Aggressive behavior   Behavioral and psychological symptoms of dementia (HCC)   End of life care   Agitation   Malnutrition of moderate degree  Nathan Schroeder is a 60 y/o M with hx of early on Alzheimer's dementia presenting from memory care unit for agitation with rapid decline in mental and functional status. He is medically stable for discharge and awaiting LTC placement.   Early onset Alzheimer's dementia Patient awaiting long term care placement, greatly appreciate SW assistance. Patient remains medically stable for discharge.  - continue sertraline, seroquel, depakote, ativan prn - continue Klonopin BID - appreciate TOC working hard on placement  Urinary Retention Likely has bladder stretch injury. Foley catheter from 3/14 - 4/6 for decompression. No issues with voiding since then. - Urinating without assistance at this time.   Prior to Admission Living Arrangement: Memory care unit Anticipated Discharge Location: Long-term care Barriers to Discharge: Placement, stable for discharge. Has  been more cooperative with recent medication changes Dispo: Pending LTC placement  12-15-2002, MD, PGY-1 Internal Medicine Paged: [phone] After 5pm on weekdays and 1pm on weekends: On Call pager [phone]  05/08/2020, 6:49 AM

## 2020-05-08 NOTE — TOC Progression Note (Addendum)
Transition of Care [facility]) - Progression Note    Patient Details  Name: BLYTHE HARTSHORN MRN: [id number] Date of Birth: 1960/05/31  Transition [facility] Care [facility]) CM/SW Contact  Janae Bridgeman, RN Phone Number: 05/08/2020, 11:40 AM  Clinical Narrative:    Case management spoke with Devvie at [facility]. In [place], [place] and they are unable to offer an admission bed to the patient due to recent and previous behaviors.  I called and spoke with Jaclynn Guarneri, CM at [facility] in [place], [place] and clinicals were emailed to the CM at [email or web address] to explore admission beds for the patient at available facilities.  CM will continue to follow the patient for [facility] options for LTC placement.  05/08/2020 1207 - CM called and spoke with Will, CM admissions at [facility] and they have available LTC male admission beds at this time and are willing to assess the patient for possible admission.  Clinicals were sent to [email or web address] with [facility] and asked for review of the clinicals for possible placement.  CM left a message with Reyne Dumas, CM with [facility] to have her review the patients clinicals.   Expected Discharge Plan: Long Term Nursing Home Barriers to Discharge: Continued Medical Work up,Unsafe home situation  Expected Discharge Plan and Services Expected Discharge Plan: Long Term Nursing Home In-house Referral: Clinical Social Work Discharge Planning Services: CM Consult Post Acute Care Choice: Skilled Nursing Facility Living arrangements for the past 2 months: Assisted Living Facility                                       Social Determinants of Health (SDOH) Interventions    Readmission Risk Interventions No flowsheet data found.

## 2020-05-08 NOTE — Progress Notes (Signed)
Physical Therapy Treatment Patient Details Name: Nathan Schroeder MRN: [id number] DOB: 1960/05/20 Today's Date: 05/08/2020    History of Present Illness 60 y.o. male admitted on 03/10/20 for dementia with behavioral disturbance.  Pt from [facility].  Pt with significant PMH of HTN, anxiety, Alzheimer's disease.    PT Comments    Pt was able to stand EOB multiple times with two person assist and sara plus electric stander.  We did successfully transfer OOB to chair in standing with use of the sara plus as well.  He was able to sit EOB with supervision for ~15 mins to eat his lunch.  Self feeding was fairly automatic other than re direction needed to use his spoon to eat his ice cream (magic cup will never melt) instead of trying to drink it.  He was calm throughout our session.  I did not feel safe attempting ambulation as it takes 4 person assist to do so safely and we only had two today.  He was left up OOB in the recliner chair with alarm engaged.  RN aware, doors open.    Follow Up Recommendations  SNF     Equipment Recommendations  Hospital bed;Wheelchair (measurements PT);Wheelchair cushion (measurements PT);Other (comment) (hoyer lift)    Recommendations for Other Services       Precautions / Restrictions Precautions Precautions: Fall Precaution Comments: advanced dementia with behavior disturbance    Mobility  Bed Mobility Overal bed mobility: Needs Assistance Bed Mobility: Supine to Sit     Supine to sit: Max assist;HOB elevated     General bed mobility comments: Max assist to move bil legs over the side of the bed, elevate HOB to max upright position and push pt's trunk up from behind to sitting.  He has poor initiation even with multimodal cues, but did kick in some minimal trunk flexion once I was behind him.    Transfers Overall transfer level: Needs assistance   Transfers: Sit to/from Stand Sit to Stand: Max assist;+2 physical assistance;+2  safety/equipment;From elevated surface         General transfer comment: attempted to stand EOB without sara plus electric stander, but not successful (due to initiation).  Pt stood x 3 with ultimate transfer to the chair with sara plus on his final stand.  Posterior pushing with hands and trunk during standing attempts, but very secure in the sara plus.  Ambulation/Gait             General Gait Details: did not attempt toay as it sounds as if +4 is needed to do this safely (two at each side, one to pull the sara and one behind very close with the chair).   Stairs             Wheelchair Mobility    Modified Rankin (Stroke Patients Only)       Balance Overall balance assessment: Needs assistance Sitting-balance support: Feet supported;No upper extremity supported Sitting balance-Leahy Scale: Fair Sitting balance - Comments: close supervision EOB once positioned wiht feet on the ground and bed mildly elevated (he is tall).  Pt sat and ate his lunch, preforming this task fairly automatically, only having to be re directed as he tried to drink his magic cup ice cream.  I could re-direct him to use a spoon, but if at all distracted, he would go back to trying to drink it again. Postural control: Posterior lean Standing balance support: Bilateral upper extremity supported Standing balance-Leahy Scale: Zero Standing  balance comment: max support from electric sara plus standing frame.                            Cognition Arousal/Alertness: Awake/alert Behavior During Therapy: Flat affect Overall Cognitive Status: History of cognitive impairments - at baseline                                 General Comments: advanced dementia at baseline with behavioral disturbance.      Exercises      General Comments        Pertinent Vitals/Pain Pain Assessment: Faces Pain Score: 0-No pain    Home Living                      Prior Function             PT Goals (current goals can now be found in the care plan section) Acute Rehab PT Goals Patient Stated Goal: unable to state Progress towards PT goals: Progressing toward goals    Frequency    Min 2X/week      PT Plan Current plan remains appropriate    Co-evaluation              AM-PAC PT "6 Clicks" Mobility   Outcome Measure  Help needed turning from your back to your side while in a flat bed without using bedrails?: A Lot Help needed moving from lying on your back to sitting on the side of a flat bed without using bedrails?: A Lot Help needed moving to and from a bed to a chair (including a wheelchair)?: Total Help needed standing up from a chair using your arms (e.g., wheelchair or bedside chair)?: Total Help needed to walk in hospital room?: Total Help needed climbing 3-5 steps with a railing? : Total 6 Click Score: 8    End of Session Equipment Utilized During Treatment: Gait belt Activity Tolerance: Patient tolerated treatment well Patient left: in chair;with call bell/phone within reach;with chair alarm set Nurse Communication: Mobility status;Other (comment) (chair alarm, use maxi move lift for back to bed.) PT Visit Diagnosis: Adult, failure to thrive (R62.7);Other abnormalities of gait and mobility (R26.89)     Time: 1330-1400 PT Time Calculation (min) (ACUTE ONLY): 30 min  Charges:  $Therapeutic Activity: 23-37 mins                     Corinna Capra, PT, DPT  Acute Rehabilitation [phone] pager [phone]) [phone] office

## 2020-05-08 NOTE — Progress Notes (Signed)
CSW received message from Ed Weeks at [facility] stating he can add the patient to the memory care unit waiting list but the facility will not be able to offer an assisted bed due to the patient's needs.  CSW updated patient's son Tavin of information.  Edwin Dada, MSW, LCSW Transitions of Care  Clinical Social Worker II [phone]

## 2020-05-09 MED ORDER — ADULT MULTIVITAMIN W/MINERALS CH
1.0000 | ORAL_TABLET | Freq: Every day | ORAL | Status: DC
  Administered 2020-05-10 – 2020-05-28 (×18): 1 via ORAL
  Filled 2020-05-09 (×20): qty 1

## 2020-05-09 NOTE — TOC Progression Note (Addendum)
Transition of Care [facility]) - Progression Note    Patient Details  Name: Nathan Schroeder MRN: [id number] Date of Birth: [birth year]  Transition of Care [facility]) CM/SW Contact  Janae Bridgeman, RN Phone Number: 05/09/2020, 10:13 AM  Clinical Narrative:    Case management called and left a message with Will Aundria Rud, CM at [facility] and Rehabilitation asking for decision on bed availability and offer for placement for the patient.  I also sent a secure email to [email or web address] requesting communication regarding this matter.  I also called and left a message with Jaclynn Guarneri, CM at [facility] with [facility] to request bed offer for the patient for admission.  CM and MSW will continue to explore admission beds for this patient.  05/09/2020 1120 - CM spoke called and left a message with Carole Civil, CM at [facility] [phone] to update them on the status of the patient in finding a skilled nursing bed for long term care placement.  05/09/2020 1130 - CM spoke with Will, CM at [facility] and the facility is considering offering a bed to the patient.  Will, CM plans to speak to the family today to discuss patient's history, needs, and financials to pay for admission at the facility.  CM will continue to follow the patient for admission to SNF - private pay.   Expected Discharge Plan: Long Term Nursing Home Barriers to Discharge: Continued Medical Work up,Unsafe home situation  Expected Discharge Plan and Services Expected Discharge Plan: Long Term Nursing Home In-house Referral: Clinical Social Work Discharge Planning Services: CM Consult Post Acute Care Choice: Skilled Nursing Facility Living arrangements for the past 2 months: Assisted Living Facility                                       Social Determinants of Health (SDOH) Interventions    Readmission Risk Interventions No flowsheet data found.

## 2020-05-09 NOTE — Progress Notes (Signed)
   Subjective:   No acute events overnight.  Patient remains at baseline.  Appears comfortable.  Objective:  Vital signs in last 24 hours: Vitals:   05/07/20 1937 05/08/20 0434 05/08/20 1300 05/09/20 0434  BP: 125/67 (!) 128/58 133/68 100/70  Pulse: 70 82 (!) 59 66  Resp: 17 18 16 15   Temp: 98 F (36.7 C) 97.8 F (36.6 C) 98.5 F (36.9 C) 98.3 F (36.8 C)  TempSrc: Axillary Axillary Oral Axillary  SpO2: 98% 99% 99% 96%  Weight:       Physical Exam Vitals and nursing note reviewed.  Constitutional: eyes track appropriately, responds "yes" to questions, somnolent Head: atraumatic ENT: external ears normal Eyes: EOMI Cardiovascular: regular rate and rhythm, normal heart sounds Pulmonary: effort normal, normal breath sounds bilaterally Abdominal: flat Skin: warm and dry  Assessment/Plan:  Principal Problem:   Alzheimer's disease (HCC) Active Problems:   Aggressive behavior   Behavioral and psychological symptoms of dementia (HCC)   End of life care   Agitation   Malnutrition of moderate degree  Mr. Huelsmann is a 60 y/o M with hx of early on Alzheimer's dementia presenting from memory care unit for agitation with rapid decline in mental and functional status. He is medically stable for discharge and awaiting LTC placement.   Early onset Alzheimer's dementia Patient awaiting long term care placement, greatly appreciate SW assistance. Patient remains medically stable for discharge.  - continue sertraline, seroquel, depakote - continue Klonopin BID, ativan prn - appreciate TOC working hard on placement  Urinary Retention Likely has bladder stretch injury. Foley catheter from 3/14 - 4/6 for decompression. No issues with voiding since then. - Urinating without assistance at this time.   Prior to Admission Living Arrangement: Memory care unit Anticipated Discharge Location: Long-term care Barriers to Discharge: Placement, stable for discharge. Has been more cooperative  with recent medication changes Dispo: Pending LTC placement  12-15-2002, MD, PGY-1 Internal Medicine Paged: [phone] After 5pm on weekdays and 1pm on weekends: On Call pager [phone]  05/09/2020, 7:33 AM

## 2020-05-09 NOTE — Progress Notes (Signed)
Nutrition Follow-up  DOCUMENTATION CODES:   Non-severe (moderate) malnutrition in context of chronic illness  INTERVENTION:   Increase Magic cup to TID with meals, each supplement provides 290 kcal and 9 grams of protein  Continue Ensure Enlive po BID, each supplement provides 350 kcal and 20 grams of protein  Pt requires feeding assistance/monitoring at meal times  Recommend Magic Cup as med pass  Add MVI with Minerals  Need NEW WEIGHT; no weight since February  NUTRITION DIAGNOSIS:   Inadequate oral intake related to decreased appetite as evidenced by meal completion < 50%.  Being addressed via supplements, feeding assistance  GOAL:   Patient will meet greater than or equal to 90% of their needs  Progressing  MONITOR:   PO intake,Supplement acceptance,Labs,Weight trends,Skin,I & O's  REASON FOR ASSESSMENT:   Consult Assessment of nutrition requirement/status  ASSESSMENT:   Nathan Schroeder is a 60 y/o male with a history of early onset alzheimer's disease presenting from memory care unit for aggressive behavior with rapid decline in he mental and functional status.   Recorded po intake 25-100% of meals. PO intake fluctuates. Per RN, pt did not really eat breakfast this AM. Pt did not sip from straw when water presented to pt this AM. Did not off Ensure given this as well  RN indicates pt appears to like cheeseburgers, eats 100% of these. Plan to send these more frequently on meal trays. Pt only says "yes" and "hey" so unable to obtain food preferences  Pt also likes ice cream, RNs utilizing ice cream for med pass. Recommend using Magic Cup in place of ice cream as med pass as able.   No weight since 2/19. Need new weight  Labs: reviewed Meds: MVI with Minerals    Diet Order:   Diet Order            DIET FINGER FOODS Room service appropriate? No; Fluid consistency: Thin  Diet effective now                 EDUCATION NEEDS:   No education needs have been  identified at this time  Skin:  Skin Assessment: Reviewed RN Assessment (very dry and flaky)  Last BM:  4/12  Height:   Ht Readings from Last 1 Encounters:  09/22/19 6' (1.829 m)    Weight:   Wt Readings from Last 1 Encounters:  03/17/20 124.9 kg    Ideal Body Weight:  80.9 kg  BMI:  Body mass index is 37.34 kg/m.  Estimated Nutritional Needs:   Kcal:  2200-2400  Protein:  120-135 grams  Fluid:  > 2 L   Romelle Starcher MS, RDN, LDN, CNSC Registered Dietitian III Clinical Nutrition RD Pager and On-Call Pager Number Located in [place]

## 2020-05-10 LAB — SARS CORONAVIRUS 2 (TAT 6-24 HRS): SARS Coronavirus 2: NEGATIVE

## 2020-05-10 NOTE — TOC Progression Note (Addendum)
Transition of Care [facility]) - Progression Note    Patient Details  Name: GABRYEL FILES MRN: [id number] Date of Birth: [birth year]  Transition of Care [facility]) CM/SW Contact  Janae Bridgeman, RN Phone Number: 05/10/2020, 8:52 AM  Clinical Narrative:    Case management called and sent a secure email to Will Roger, CM at [facility] this morning to ask about possible admission bed and placement at the facility.  Edwin Dada, MSW spoke with the patient's son, Page Lancon, this morning and the facility was calling the patient's long term care insurance provider to provide pre-authorization and possible payment for stay at the facility.  CM spoke with Reyne Dumas, CM with [facility] and she plans to meet with the patient this morning at 10 am to assess patient for possible placement for LTC as well.  CM and MSW will continue to follow the patient for LTC admission at available accepting facility.  05/10/2020 1203 - CM spoke with Colman Cater, CM at [facility] facility [phone]) and the facility is currently in conversation with the patient's wife to call the patient's LTC insurance policy to confirm coverage for the patient at the facility.  The facility has offered a bed to the patient at this time and is just waiting on insurance authorization for placement.   Expected Discharge Plan: Long Term Nursing Home Barriers to Discharge: Continued Medical Work up,Unsafe home situation  Expected Discharge Plan and Services Expected Discharge Plan: Long Term Nursing Home In-house Referral: Clinical Social Work Discharge Planning Services: CM Consult Post Acute Care Choice: Skilled Nursing Facility Living arrangements for the past 2 months: Assisted Living Facility                                       Social Determinants of Health (SDOH) Interventions    Readmission Risk Interventions No flowsheet data found.

## 2020-05-10 NOTE — Progress Notes (Signed)
   Subjective:   Patient remains at baseline today. Responds "yeah" to questions. Appears comfortable.  Objective:  Vital signs in last 24 hours: Vitals:   05/09/20 0434 05/09/20 1405 05/09/20 2003 05/10/20 0408  BP: 100/70 118/65 104/77 108/63  Pulse: 66 61 83 66  Resp: 15 16 16 15   Temp: 98.3 F (36.8 C) 98 F (36.7 C) 98 F (36.7 C) 97.8 F (36.6 C)  TempSrc: Axillary Oral  Axillary  SpO2: 96% 97% 97% 97%  Weight:       Physical Exam Vitals and nursing note reviewed.  Constitutional: eyes track appropriately, responds "yeah" to questions, somnolent Head: atraumatic ENT: external ears normal Eyes: EOMI Cardiovascular: regular rate and rhythm, normal heart sounds Pulmonary: effort normal, normal breath sounds bilaterally Abdominal: flat Skin: warm and dry  Assessment/Plan:  Principal Problem:   Alzheimer's disease (HCC) Active Problems:   Aggressive behavior   Behavioral and psychological symptoms of dementia (HCC)   End of life care   Agitation   Malnutrition of moderate degree  Mr. Vannice is a 60 y/o M with hx of early on Alzheimer's dementia presenting from memory care unit for agitation with rapid decline in mental and functional status. He is medically stable for discharge and awaiting LTC placement.   Early onset Alzheimer's dementia Patient awaiting long term care placement, greatly appreciate SW assistance. Patient remains medically stable for discharge.  - continue sertraline, seroquel, depakote - continue Klonopin BID, ativan prn - appreciate TOC working hard on placement  Urinary Retention Likely has bladder stretch injury. Foley catheter from 3/14 - 4/6 for decompression. No issues with voiding since then. - Urinating without assistance at this time.   Prior to Admission Living Arrangement: Memory care unit Anticipated Discharge Location: Long-term care Barriers to Discharge: Placement, stable for discharge. Has been more cooperative with recent  medication changes Dispo: Pending LTC placement  12-15-2002, MD, PGY-1 Internal Medicine Paged: [phone] After 5pm on weekdays and 1pm on weekends: On Call pager [phone]  05/10/2020, 6:40 AM

## 2020-05-11 MED ORDER — CLONAZEPAM 0.5 MG PO TABS
0.5000 mg | ORAL_TABLET | Freq: Every day | ORAL | Status: DC
  Administered 2020-05-12 – 2020-05-15 (×4): 0.5 mg via ORAL
  Filled 2020-05-11 (×4): qty 1

## 2020-05-11 NOTE — Progress Notes (Signed)
   Subjective:   Patient resting comfortably. Mentation at baseline. Was cooperative yesterday. Adequate urine output.  Objective:  Vital signs in last 24 hours: Vitals:   05/09/20 0434 05/09/20 1405 05/09/20 2003 05/10/20 0408  BP: 100/70 118/65 104/77 108/63  Pulse: 66 61 83 66  Resp: 15 16 16 15   Temp: 98.3 F (36.8 C) 98 F (36.7 C) 98 F (36.7 C) 97.8 F (36.6 C)  TempSrc: Axillary Oral  Axillary  SpO2: 96% 97% 97% 97%  Weight:       Physical Exam Vitals and nursing note reviewed.  Constitutional: eyes track appropriately, responds "yeah" to questions Head: atraumatic ENT: external ears normal Eyes: EOMI Cardiovascular: regular rate and rhythm, normal heart sounds Pulmonary: effort normal, normal breath sounds bilaterally Abdominal: flat Skin: warm and dry  Assessment/Plan:  Principal Problem:   Alzheimer's disease (HCC) Active Problems:   Aggressive behavior   Behavioral and psychological symptoms of dementia (HCC)   End of life care   Agitation   Malnutrition of moderate degree  Mr. Cotroneo is a 60 y/o M with hx of early on Alzheimer's dementia presenting from memory care unit for agitation with rapid decline in mental and functional status. He is medically stable for discharge and awaiting LTC placement.   Early onset Alzheimer's dementia Patient awaiting long term care placement, greatly appreciate SW assistance. Patient remains medically stable for discharge.  - continue sertraline, seroquel, depakote - continue Klonopin BID, ativan prn - appreciate TOC working hard on placement  Urinary Retention Likely has bladder stretch injury. Foley catheter from 3/14 - 4/6 for decompression. No issues with voiding since then. - Urinating without assistance at this time.   Prior to Admission Living Arrangement: Memory care unit Anticipated Discharge Location: Long-term care Barriers to Discharge: Placement, stable for discharge. Has been more cooperative with  recent medication changes Dispo: Pending LTC placement  12-15-2002, MD, PGY-1 Internal Medicine Paged: [phone] After 5pm on weekdays and 1pm on weekends: On Call pager [phone]  05/11/2020, 6:52 AM

## 2020-05-11 NOTE — Progress Notes (Signed)
Physical Therapy Treatment Patient Details Name: Nathan Schroeder MRN: [id number] DOB: April 05, 1960 Today's Date: 05/11/2020    History of Present Illness 60 y.o. male admitted on 03/10/20 for dementia with behavioral disturbance.  Pt from [facility].  Pt with significant PMH of HTN, anxiety, Alzheimer's disease.    PT Comments    Today's skilled session continued to focus on mobility progression. Pt with decreased arousal today limiting his participation in therapy. Pt pleasant and responding with simple answer to questions, however kept eyes closed >90% of session. RN reports his night medications are strong and make him tired, plus she thinks he was given a prn med that also makes him sleepy overnight. Acute PT to continue during pt's hospital stay.    Follow Up Recommendations  SNF     Equipment Recommendations  Hospital bed;Wheelchair (measurements PT);Wheelchair cushion (measurements PT);Other (comment)    Recommendations for Other Services OT consult     Precautions / Restrictions Precautions Precautions: Fall Precaution Comments: advanced dementia with behavior disturbance Restrictions Weight Bearing Restrictions: No    Mobility  Bed Mobility Overal bed mobility: Needs Assistance Bed Mobility: Supine to Sit;Sit to Supine     Supine to sit: Total assist;+2 for physical assistance;HOB elevated Sit to supine: Total assist;+2 for physical assistance   General bed mobility comments: pt asleep on entry to room. Would respond pleasantly to questions. Did not open eyes despite cues. Stated "yes" to sitting up. Pt noted to have striped gown off. Donned new gown with pt minimally assisting to raise arms up to don sleeves while supine in bed. No attempt to help with sitting up to the EOB.. Once EOB pt was supervision for balance with pt opening eyes 2=3 time briefly before closing them again. Attempted to engage pt in ADL of washing face with pt only holding wash  cloth, not attempt to use even with hand over hand assist. Pt returned to supine with total assist of 2 and moved to [facility] with total assist of 2. Pt left with HOB 30 degrees, bed alarm on and needs in reach.    Transfers                 General transfer comment: did not attempt to due pt not alert enough to participate with therapy/decreased safety with standing attempt.      Cognition Arousal/Alertness: Awake/alert Behavior During Therapy: WFL for tasks assessed/performed (sleepy/lethargic) Overall Cognitive Status: History of cognitive impairments - at baseline Area of Impairment: Problem solving;Awareness;Safety/judgement;Following commands;Memory;Attention;Orientation                 Orientation Level: Disoriented to;Place;Situation;Time Current Attention Level: Selective Memory: Decreased recall of precautions;Decreased short-term memory Following Commands: Follows one step commands inconsistently;Follows one step commands with increased time Safety/Judgement: Decreased awareness of safety;Decreased awareness of deficits Awareness: Intellectual Problem Solving: Slow processing;Requires verbal cues;Requires tactile cues;Decreased initiation General Comments: advanced dementia at baseline with behavioral disturbance.       Pertinent Vitals/Pain Pain Assessment: No/denies pain Pain Score: 0-No pain     PT Goals (current goals can now be found in the care plan section) Acute Rehab PT Goals Patient Stated Goal: unable to state PT Goal Formulation: Patient unable to participate in goal setting Time For Goal Achievement: 05/17/20 Potential to Achieve Goals: Fair Progress towards PT goals: Progressing toward goals    Frequency    Min 2X/week      PT Plan Current plan remains appropriate    AM-PAC PT "  6 Clicks" Mobility   Outcome Measure  Help needed turning from your back to your side while in a flat bed without using bedrails?: A Lot Help needed moving  from lying on your back to sitting on the side of a flat bed without using bedrails?: A Lot Help needed moving to and from a bed to a chair (including a wheelchair)?: Total Help needed standing up from a chair using your arms (e.g., wheelchair or bedside chair)?: Total Help needed to walk in hospital room?: Total Help needed climbing 3-5 steps with a railing? : Total 6 Click Score: 8    End of Session   Activity Tolerance: Patient tolerated treatment well;Patient limited by fatigue Patient left: in bed;with call bell/phone within reach;with bed alarm set Nurse Communication: Mobility status;Other (comment) (decreased altertness to participate with therapy) PT Visit Diagnosis: Adult, failure to thrive (R62.7);Other abnormalities of gait and mobility (R26.89)     Time: 1034-1050 PT Time Calculation (min) (ACUTE ONLY): 16 min  Charges:  $Therapeutic Activity: 8-22 mins                     Sallyanne Kuster, PTA, [facility] Acute Rehab Services Office- [phone] 05/11/20, 11:03 AM   Sallyanne Kuster 05/11/2020, 11:03 AM

## 2020-05-12 NOTE — Progress Notes (Signed)
   Subjective:   Resting comfortably on examination. He answers questions with "yeah."  Objective:  Vital signs in last 24 hours: Vitals:   05/11/20 1257 05/11/20 2108 05/11/20 2252 05/12/20 0538  BP: 122/60 125/62 136/76 (!) 126/93  Pulse: 67 68 71 (!) 53  Resp: 16 18 16 16   Temp: 98.5 F (36.9 C) 98.4 F (36.9 C) 97.7 F (36.5 C) 97.8 F (36.6 C)  TempSrc: Axillary Axillary  Tympanic  SpO2: 97% 98% 98% 99%  Weight:       Physical Exam Vitals and nursing note reviewed.  Constitutional: eyes track appropriately, responds "yeah" to questions Head: atraumatic ENT: external ears normal Eyes: EOMI Abdominal: flat Skin: warm and dry  Assessment/Plan:  Principal Problem:   Alzheimer's disease (HCC) Active Problems:   Aggressive behavior   Behavioral and psychological symptoms of dementia (HCC)   End of life care   Agitation   Malnutrition of moderate degree  Mr. Bogus is a 60 y/o M with hx of early on Alzheimer's dementia presenting from memory care unit for agitation with rapid decline in mental and functional status. He is medically stable for discharge and awaiting LTC placement.   Early onset Alzheimer's dementia Patient awaiting long term care placement, greatly appreciate SW assistance. Patient remains medically stable for discharge.  - continue sertraline, seroquel, depakote - continue ativan prn - decrease klonopin to QD from BID to decrease sedation - appreciate TOC working hard on placement  Urinary Retention Likely has bladder stretch injury. Foley catheter from 3/14 - 4/6 for decompression. No issues with voiding since then. - Urinating without assistance at this time.   Prior to Admission Living Arrangement: Memory care unit Anticipated Discharge Location: Long-term care Barriers to Discharge: Placement, stable for discharge. Has been more cooperative with recent medication changes Dispo: Pending LTC placement  Dr. 12-15-2002 Internal Medicine  PGY-2  Pager: [phone] After 5pm on weekdays and 1pm on weekends: On Call pager [phone]  05/12/2020, 7:39 AM

## 2020-05-13 NOTE — Progress Notes (Signed)
   Subjective:   Resting on examination although appears cold, so blanket was placed.  Objective:  Vital signs in last 24 hours: Vitals:   05/12/20 0800 05/12/20 1522 05/12/20 2158 05/13/20 0608  BP:  134/74 108/70 (!) 112/55  Pulse:  (!) 57 85 62  Resp:  18 18 16   Temp:  98.3 F (36.8 C) 98.8 F (37.1 C) 97.9 F (36.6 C)  TempSrc:  Oral Oral Oral  SpO2:  98% 96% 96%  Weight:      Height: 6' (1.829 m)      Physical Exam Vitals and nursing note reviewed.  Head: atraumatic ENT: external ears normal Eyes: EOMI Abdominal: flat Skin: warm and dry  Assessment/Plan:  Principal Problem:   Alzheimer's disease (HCC) Active Problems:   Aggressive behavior   Behavioral and psychological symptoms of dementia (HCC)   End of life care   Agitation   Malnutrition of moderate degree  Mr. Rabideau is a 60 y/o M with hx of early on Alzheimer's dementia presenting from memory care unit for agitation with rapid decline in mental and functional status. He is medically stable for discharge and awaiting LTC placement.   Early onset Alzheimer's dementia Patient awaiting long term care placement, greatly appreciate SW assistance. Patient remains medically stable for discharge.  - continue sertraline, seroquel, depakote - continue ativan prn - continue klonopin QD - appreciate TOC working hard on placement  Urinary Retention Likely has bladder stretch injury. Foley catheter from 3/14 - 4/6 for decompression. No issues with voiding since then. - Urinating without assistance at this time.   Prior to Admission Living Arrangement: Memory care unit Anticipated Discharge Location: Long-term care Barriers to Discharge: Placement, stable for discharge. Has been more cooperative with recent medication changes Dispo: Pending LTC placement  Dr. 12-15-2002 Internal Medicine PGY-2  Pager: [phone] After 5pm on weekdays and 1pm on weekends: On Call pager [phone]  05/13/2020, 12:54 PM

## 2020-05-13 NOTE — Plan of Care (Signed)

## 2020-05-14 MED ORDER — DIVALPROEX SODIUM 125 MG PO CSDR
500.0000 mg | DELAYED_RELEASE_CAPSULE | Freq: Two times a day (BID) | ORAL | Status: DC
  Administered 2020-05-14 – 2020-05-28 (×27): 500 mg via ORAL
  Filled 2020-05-14 (×30): qty 4

## 2020-05-14 NOTE — Progress Notes (Signed)
   Subjective:   Patient remains at his baseline. Appears comfortable. Alert.   Objective:  Vital signs in last 24 hours: Vitals:   05/12/20 2158 05/13/20 0608 05/13/20 1439 05/13/20 2305  BP: 108/70 (!) 112/55 107/67 120/70  Pulse: 85 62 60 76  Resp: 18 16 17 18   Temp: 98.8 F (37.1 C) 97.9 F (36.6 C) 98.2 F (36.8 C) 97.6 F (36.4 C)  TempSrc: Oral Oral Oral Oral  SpO2: 96% 96% 98% 95%  Weight:      Height:       Physical Exam Vitals and nursing note reviewed.  Head: atraumatic ENT: external ears normal Eyes: EOMI Abdominal: flat Skin: warm and dry  Assessment/Plan:  Principal Problem:   Alzheimer's disease (HCC) Active Problems:   Aggressive behavior   Behavioral and psychological symptoms of dementia (HCC)   End of life care   Agitation   Malnutrition of moderate degree  Nathan Schroeder is a 60 y/o M with hx of early on Alzheimer's dementia presenting from memory care unit for agitation with rapid decline in mental and functional status. He is medically stable for discharge and awaiting LTC placement.   Early onset Alzheimer's dementia Patient awaiting long term care placement, greatly appreciate SW assistance. Patient remains medically stable for discharge.   - continue sertraline, seroquel, depakote - continue ativan prn, has not required in recent past - continue klonopin QD - appreciate TOC working hard on placement  Urinary Retention Likely has bladder stretch injury. Foley catheter from 3/14 - 4/6 for decompression. No issues with voiding since then.  - Urinating without assistance at this time.   Prior to Admission Living Arrangement: Memory care unit Anticipated Discharge Location: Long-term care Barriers to Discharge: Placement, stable for discharge. Has been more cooperative with recent medication changes Dispo: Pending LTC placement  Dr. 12-15-2002 Internal Medicine PGY-1 Pager: [phone] After 5pm on weekdays and 1pm on weekends: On Call  pager [phone]  05/14/2020, 6:41 AM

## 2020-05-14 NOTE — Telephone Encounter (Signed)
Open in error

## 2020-05-15 NOTE — Progress Notes (Addendum)
2:30pm: CSW spoke with Will at [facility] - he has requested a copy of the [employer] insurance card to verify benefits from the patient's wife Elease Hashimoto.  CSW sent texts to both wife and son with Will's contact information to follow up with card information ASAP.  8:15am: CSW attempted to reach Will at [facility] without success - CSW obtained director's information from receptionist.  CSW spoke with [place] [phone] at [facility] regarding patient - [place] aware of patient but is unsure if contact was made with [employer] to confirm benefits.  CSW spoke with patient's son Daiden to update him - he is aware that the facility may call him and his mother for further discussion.  Edwin Dada, MSW, LCSW Transitions of Care  Clinical Social Worker II [phone]

## 2020-05-15 NOTE — Progress Notes (Signed)
   Subjective:   Patient resting comfortably. Seems at his baseline. No acute events overnight.  Objective:  Vital signs in last 24 hours: Vitals:   05/13/20 1439 05/13/20 2305 05/14/20 1403 05/15/20 0512  BP: 107/67 120/70 127/70 121/69  Pulse: 60 76 (!) 54 78  Resp: 17 18 19 17   Temp: 98.2 F (36.8 C) 97.6 F (36.4 C) 98 F (36.7 C) 98.1 F (36.7 C)  TempSrc: Oral Oral  Tympanic  SpO2: 98% 95% 100% 97%  Weight:      Height:       Physical Exam Vitals and nursing note reviewed.  Head: atraumatic ENT: external ears normal Eyes: EOMI Abdominal: flat Skin: warm and dry  Assessment/Plan:  Principal Problem:   Alzheimer's disease (HCC) Active Problems:   Aggressive behavior   Behavioral and psychological symptoms of dementia (HCC)   End of life care   Agitation   Malnutrition of moderate degree  Nathan Schroeder is a 60 y/o M with hx of early on Alzheimer's dementia presenting from memory care unit for agitation with rapid decline in mental and functional status. He is medically stable for discharge and awaiting LTC placement.   Early onset Alzheimer's dementia Patient awaiting long term care placement, greatly appreciate SW assistance. Patient remains medically stable for discharge.   - continue sertraline, seroquel, depakote - continue ativan prn, has not required in recent past - continue klonopin QD - appreciate TOC working hard on placement  Urinary Retention Likely has bladder stretch injury. Foley catheter from 3/14 - 4/6 for decompression. No issues with voiding since then.  - Urinating without assistance at this time.   Prior to Admission Living Arrangement: Memory care unit Anticipated Discharge Location: Long-term care Barriers to Discharge: Placement, stable for discharge. Has been more cooperative with recent medication changes Dispo: Pending LTC placement  Dr. 12-15-2002 Internal Medicine PGY-1 Pager: [phone] After 5pm on weekdays and 1pm on  weekends: On Call pager [phone]  05/15/2020, 7:39 AM

## 2020-05-15 NOTE — Progress Notes (Signed)
PT Cancellation Note  Patient Details Name: Nathan Schroeder MRN: [id number] DOB: 23-Apr-1960   Cancelled Treatment:    Reason Eval/Treat Not Completed: Fatigue/lethargy limiting ability to participate. Attempted to wake, patient unable to rouse enough to participate in therapy this afternoon. Will continue to follow.    Adri Schloss 05/15/2020, 2:21 PM

## 2020-05-16 MED ORDER — CLONAZEPAM 0.25 MG PO TBDP
0.2500 mg | ORAL_TABLET | Freq: Every day | ORAL | Status: DC
  Administered 2020-05-16: 0.25 mg via ORAL
  Filled 2020-05-16: qty 1

## 2020-05-16 MED ORDER — CLONAZEPAM 0.25 MG PO TBDP: 0.2500 mg | ORAL_TABLET | Freq: Every day | ORAL | Status: DC

## 2020-05-16 MED ORDER — CLONAZEPAM 0.5 MG PO TABS: 0.2500 mg | ORAL_TABLET | Freq: Every day | ORAL | Status: DC

## 2020-05-16 NOTE — Progress Notes (Signed)
Physical Therapy Treatment Patient Details Name: Nathan Schroeder MRN: [id number] DOB: 07-Nov-1960 Today's Date: 05/16/2020    History of Present Illness 60 y.o. male admitted on 03/10/20 for dementia with behavioral disturbance.  Pt from [facility].  Pt with significant PMH of HTN, anxiety, Alzheimer's disease.    PT Comments    Continuing work on functional mobility and activity tolerance;  Session focused on safe OOB to chair transfers, to get the benefit of sitting up; Very sleepy, lethargic this afternoon, and kept eyes closed vast majority of session; employed Maximove for safe OOB to chair transfer  Follow Up Recommendations  SNF     Equipment Recommendations  Hospital bed;Wheelchair (measurements PT);Wheelchair cushion (measurements PT);Other (comment) (mechanical lift)    Recommendations for Other Services       Precautions / Restrictions Precautions Precautions: Fall Precaution Comments: advanced dementia with behavior disturbance    Mobility  Bed Mobility Overal bed mobility: Needs Assistance Bed Mobility: Rolling Rolling: +2 for physical assistance;Max assist         General bed mobility comments: Rolled for placement of Maximove pad    Transfers Overall transfer level: Needs assistance Equipment used: Ambulation equipment used             General transfer comment: Used Maximove, mechanical lift, for safe transfer OOB to recliner as pt was too lethargic to practice standing/pivot  Ambulation/Gait                 Stairs             Wheelchair Mobility    Modified Rankin (Stroke Patients Only)       Balance                                            Cognition Arousal/Alertness: Lethargic Behavior During Therapy: WFL for tasks assessed/performed Overall Cognitive Status: History of cognitive impairments - at baseline                                 General Comments: advanced  dementia at baseline with behavioral disturbance.      Exercises      General Comments        Pertinent Vitals/Pain Pain Assessment: Faces Faces Pain Scale: Hurts little more Pain Location: Likely R thigh; Grimaced and shook the Maximove while hooking in LEs Pain Descriptors / Indicators: Grimacing Pain Intervention(s): Monitored during session    Home Living                      Prior Function            PT Goals (current goals can now be found in the care plan section) Acute Rehab PT Goals Patient Stated Goal: unable to state PT Goal Formulation: Patient unable to participate in goal setting Time For Goal Achievement: 05/30/20 Potential to Achieve Goals: Fair Progress towards PT goals: Not progressing toward goals - comment (Lethargic past 2 sessions)    Frequency    Min 2X/week      PT Plan Current plan remains appropriate    Co-evaluation              AM-PAC PT "6 Clicks" Mobility   Outcome Measure  Help needed turning from your back to your side  while in a flat bed without using bedrails?: A Lot Help needed moving from lying on your back to sitting on the side of a flat bed without using bedrails?: Total Help needed moving to and from a bed to a chair (including a wheelchair)?: Total Help needed standing up from a chair using your arms (e.g., wheelchair or bedside chair)?: Total Help needed to walk in hospital room?: Total Help needed climbing 3-5 steps with a railing? : Total 6 Click Score: 7    End of Session   Activity Tolerance: Patient tolerated treatment well;Patient limited by fatigue Patient left: in chair;with call bell/phone within reach;with chair alarm set;Other (comment) (belt-type chair alarm) Nurse Communication: Mobility status;Need for lift equipment PT Visit Diagnosis: Adult, failure to thrive (R62.7);Other abnormalities of gait and mobility (R26.89)     Time: 1341-1410 PT Time Calculation (min) (ACUTE ONLY): 29  min  Charges:  $Therapeutic Activity: 23-37 mins                     Van Clines, PT  Acute Rehabilitation Services Pager [phone] Office [phone]    Levi Aland 05/16/2020, 4:24 PM

## 2020-05-16 NOTE — Progress Notes (Addendum)
   Subjective:   Patient is somnolent again this morning but arousable. Received PRN ativan last night. Did receive a dose of ativan last night for difficulty sleeping. Nursing notes he has been sleeping more during the day and more active at night recently. He is newly shaved. Mentally remains at his baseline, responds "yes" to most questions.  Objective:  Vital signs in last 24 hours: Vitals:   05/15/20 0512 05/15/20 1507 05/15/20 2026 05/16/20 0610  BP: 121/69 105/66 109/61 111/70  Pulse: 78 72 84 (!) 54  Resp: 17 14 18    Temp: 98.1 F (36.7 C) 98.8 F (37.1 C) 98.6 F (37 C) 98.4 F (36.9 C)  TempSrc: Tympanic Axillary Oral Oral  SpO2: 97% 97% 96% 95%  Weight:      Height:       Physical Exam Vitals and nursing note reviewed.  Head: atraumatic ENT: external ears normal Eyes: EOMI Abdominal: flat Skin: warm and dry  Assessment/Plan:  Principal Problem:   Alzheimer's disease (HCC) Active Problems:   Aggressive behavior   Behavioral and psychological symptoms of dementia (HCC)   End of life care   Agitation   Malnutrition of moderate degree  Nathan Schroeder is a 60 y/o M with hx of early on Alzheimer's dementia presenting from memory care unit for agitation with rapid decline in mental and functional status. He is medically stable for discharge and awaiting LTC placement.   Early onset Alzheimer's dementia Patient awaiting long term care placement, greatly appreciate SW assistance. Patient remains medically stable for discharge.   - continue sertraline, seroquel, depakote - discontinue ativan prn - decrease klonopin to 0.25mg  at bedtime - appreciate TOC working hard on placement  Prior to Admission Living Arrangement: Memory care unit Anticipated Discharge Location: Long-term care Barriers to Discharge: Placement, stable for discharge. Has been more cooperative with recent medication changes Dispo: Pending LTC placement  Dr. 77 Internal Medicine  PGY-1 Pager: [phone] After 5pm on weekdays and 1pm on weekends: On Call pager [phone]  05/16/2020, 6:48 AM

## 2020-05-16 NOTE — Progress Notes (Signed)
CSW obtained [employer] number from patient's wife Elease Hashimoto and gave it to Will at [facility] so he can verify benefits.   Edwin Dada, MSW, LCSW Transitions of Care  Clinical Social Worker II [phone]

## 2020-05-16 NOTE — Progress Notes (Signed)
Nutrition Follow-up  DOCUMENTATION CODES:   Non-severe (moderate) malnutrition in context of chronic illness  INTERVENTION:   -Continue Magic cup TID with meals, each supplement provides 290 kcal and 9 grams of protein -Continue Ensure Enlive po TID, each supplement provides 350 kcal and 20 grams of protein -Continue feeding assistance with meals -Continue MVI with minerals daily -Recommend obtaining new wt; no new wt since February 2022  NUTRITION DIAGNOSIS:   Inadequate oral intake related to decreased appetite as evidenced by meal completion < 50%.  Being addressed via supplements, feeding assistance  GOAL:   Patient will meet greater than or equal to 90% of their needs  Progressing  MONITOR:   PO intake,Supplement acceptance,Labs,Weight trends,Skin,I & O's  REASON FOR ASSESSMENT:   Consult Assessment of nutrition requirement/status  ASSESSMENT:   Nathan Schroeder is a 60 y/o male with a history of early onset alzheimer's disease presenting from memory care unit for aggressive behavior with rapid decline in he mental and functional status.  Reviewed I/O's: -645 ml x 24 hours and +1.1 L since 05/02/20  UOP: 1.3 L x 24 hours  Pt receiving nursing care at time of visit.   Pt's intake has improved since last visit. Noted meal completions 50-100%. MD continuing to work on medications to assist with somnolence.   Per MAR, pt consuming 2-3 Ensure supplements daily.   No new wt since admission.  Per TOC notes, pt is medically stable for discharge and is awaiting placement.   Medications reviewed and include depakote.   Labs reviewed.   Diet Order:   Diet Order            DIET FINGER FOODS Room service appropriate? No; Fluid consistency: Thin  Diet effective now                 EDUCATION NEEDS:   No education needs have been identified at this time  Skin:  Skin Assessment: Reviewed RN Assessment  Last BM:  05/15/20  Height:   Ht Readings from Last 1  Encounters:  05/12/20 6' (1.829 m)    Weight:   Wt Readings from Last 1 Encounters:  03/17/20 124.9 kg    Ideal Body Weight:  80.9 kg  BMI:  Body mass index is 37.34 kg/m.  Estimated Nutritional Needs:   Kcal:  2200-2400  Protein:  120-135 grams  Fluid:  > 2 L    Levada Schilling, RD, LDN, CDCES Registered Dietitian II Certified Diabetes Care and Education Specialist Please refer to [facility] for RD and/or RD on-call/weekend/after hours pager

## 2020-05-17 LAB — CBC
HCT: 42.9 % (ref 39.0–52.0)
Hemoglobin: 14.5 g/dL (ref 13.0–17.0)
MCH: 33.3 pg (ref 26.0–34.0)
MCHC: 33.8 g/dL (ref 30.0–36.0)
MCV: 98.4 fL (ref 80.0–100.0)
Platelets: 149 10*3/uL — ABNORMAL LOW (ref 150–400)
RBC: 4.36 MIL/uL (ref 4.22–5.81)
RDW: 13.2 % (ref 11.5–15.5)
WBC: 5.7 10*3/uL (ref 4.0–10.5)
nRBC: 0 % (ref 0.0–0.2)

## 2020-05-17 LAB — BLOOD GAS, VENOUS
Acid-Base Excess: 3 mmol/L — ABNORMAL HIGH (ref 0.0–2.0)
Bicarbonate: 28.1 mmol/L — ABNORMAL HIGH (ref 20.0–28.0)
Drawn by: 4956
FIO2: 21
O2 Saturation: 31.6 %
Patient temperature: 37
pCO2, Ven: 51.8 mmHg (ref 44.0–60.0)
pH, Ven: 7.353 (ref 7.250–7.430)
pO2, Ven: <31 mmHg — CL (ref 32.0–45.0)

## 2020-05-17 LAB — COMPREHENSIVE METABOLIC PANEL
ALT: 15 U/L (ref 0–44)
AST: 16 U/L (ref 15–41)
Albumin: 3.2 g/dL — ABNORMAL LOW (ref 3.5–5.0)
Alkaline Phosphatase: 60 U/L (ref 38–126)
Anion gap: 8 (ref 5–15)
BUN: 16 mg/dL (ref 8–23)
CO2: 28 mmol/L (ref 22–32)
Calcium: 9.1 mg/dL (ref 8.9–10.3)
Chloride: 105 mmol/L (ref 98–111)
Creatinine, Ser: 0.83 mg/dL (ref 0.61–1.24)
GFR, Estimated: >60 mL/min (ref >60)
Glucose, Bld: 96 mg/dL (ref 70–99)
Potassium: 4.1 mmol/L (ref 3.5–5.1)
Sodium: 141 mmol/L (ref 135–145)
Total Bilirubin: 0.7 mg/dL (ref 0.3–1.2)
Total Protein: 7 g/dL (ref 6.5–8.1)

## 2020-05-17 LAB — VALPROIC ACID LEVEL: Valproic Acid Lvl: 37 ug/mL — ABNORMAL LOW (ref 50.0–100.0)

## 2020-05-17 NOTE — Progress Notes (Signed)
CSW attempted to speak with Will at [facility] without success - a voicemail was left requesting a return call.  Edwin Dada, MSW, LCSW Transitions of Care  Clinical Social Worker II [phone]

## 2020-05-17 NOTE — Progress Notes (Signed)
   Subjective:   Patient still somnolent but arousable this morning. Ativan was discontinued, no prns received overnight.  Per nursing, he has been receiving his Depakote. Note that this was changed from tablet to sprinkle capsule 2 days ago as he was refusing some tablets. Concerned that he may be receiving more than he previously was, or alternatively is not getting as much if he is not eating all his food.   Objective:  Vital signs in last 24 hours: Vitals:   05/16/20 0610 05/16/20 1602 05/16/20 2251 05/17/20 0518  BP: 111/70 107/64 116/66 95/63  Pulse: (!) 54 71 63 (!) 56  Resp:  14 15 19   Temp: 98.4 F (36.9 C) 98.8 F (37.1 C) 98.4 F (36.9 C) 97.6 F (36.4 C)  TempSrc: Oral Oral Oral Oral  SpO2: 95% 97% 98% 98%  Weight:      Height:       Physical Exam Vitals and nursing note reviewed.  Head: atraumatic ENT: external ears normal Eyes: EOMI Abdominal: flat Skin: warm and dry Neuro: somnolent but arousable, some shaking seen while waking but patient was alert during this and it stopped spontaneously after few moments  Assessment/Plan:  Principal Problem:   Alzheimer's disease (HCC) Active Problems:   Aggressive behavior   Behavioral and psychological symptoms of dementia (HCC)   End of life care   Agitation   Malnutrition of moderate degree  Mr. Beswick is a 60 y/o M with hx of early on Alzheimer's dementia presenting from memory care unit for agitation with rapid decline in mental and functional status. He is medically stable for discharge and awaiting LTC placement.   Early onset Alzheimer's dementia Patient awaiting long term care placement, greatly appreciate SW assistance. Patient remains medically stable for discharge. More somnolent over the past few days, concern for dose stacking from klonopin vs depakote dosing (could be too much or too little with recent difficulty taking meds and transition to sprinkle capsules)  - check depakote trough - continue  sertraline, seroquel, depakote sprinkles - discontinue ativan prn - discontinue klonopin - appreciate TOC working hard on placement  Prior to Admission Living Arrangement: Memory care unit Anticipated Discharge Location: Long-term care Barriers to Discharge: Placement, stable for discharge. Has been more cooperative with recent medication changes Dispo: Pending LTC placement  Dr. 77 Internal Medicine PGY-1 Pager: [phone] After 5pm on weekdays and 1pm on weekends: On Call pager [phone]  05/17/2020, 7:33 AM

## 2020-05-18 NOTE — Progress Notes (Signed)
   Subjective:   Patient is sleeping but more rousable than prior day, acts at his baseline when awake.   Objective:  Vital signs in last 24 hours: Vitals:   05/17/20 0518 05/17/20 1428 05/17/20 2148 05/18/20 0623  BP: 95/63 113/69 133/77 123/78  Pulse: (!) 56 64 89 (!) 59  Resp: 19 20 18 16   Temp: 97.6 F (36.4 C) 98.4 F (36.9 C) 97.7 F (36.5 C) 98 F (36.7 C)  TempSrc: Oral Oral    SpO2: 98% 99% 96% 98%  Weight:    (!) 291.7 kg  Height:       Physical Exam Vitals and nursing note reviewed.  Head: atraumatic ENT: external ears normal Eyes: EOMI Abdominal: flat Skin: warm and dry Neuro: somnolent but arousable, some shaking seen while waking but patient was alert during this and it stopped spontaneously after few moments  Assessment/Plan:  Principal Problem:   Alzheimer's disease (HCC) Active Problems:   Aggressive behavior   Behavioral and psychological symptoms of dementia (HCC)   End of life care   Agitation   Malnutrition of moderate degree  Nathan Schroeder is a 60 y/o M with hx of early on Alzheimer's dementia presenting from memory care unit for agitation with rapid decline in mental and functional status. He is medically stable for discharge and awaiting LTC placement.   Early onset Alzheimer's dementia Patient awaiting long term care placement, greatly appreciate SW assistance. Patient remains medically stable for discharge. He was more somnolent over past few days which has improved since stopping Klonopin. Depakote trough mildly low at 37 but did not receive his dose prior to that. With his improved mental status, plan to continue depakote at current dose.  - continue sertraline, seroquel, depakote sprinkles - discontinue ativan prn - discontinue klonopin - appreciate TOC working hard on placement  Prior to Admission Living Arrangement: Memory care unit Anticipated Discharge Location: Long-term care Barriers to Discharge: Placement, stable for discharge.  Has been more cooperative with recent medication changes Dispo: Pending LTC placement  Dr. 77 Internal Medicine PGY-1 Pager: [phone] After 5pm on weekdays and 1pm on weekends: On Call pager [phone]  05/18/2020, 6:41 AM

## 2020-05-18 NOTE — Progress Notes (Signed)
Pharmacy Monitoring Note  Nathan Schroeder is a 60 y.o. male admitted on 03/10/2020 with End-Stage Altzheimer's Dementia.  Depakote monitoring per primary team.  Concern for over-sedation yesterday. On 4/18 depakote formulation was changed from ER tablet to depakote sprinkle 500mg  oral BID. It is being administered sprinkled on applesauce.  - Patient morning dose on 4/21 was refused.  - Trough drawn on 4/21 at 2024, 24 hours post-dose is 37,  - Corrected with albumin of 3.2, level corrects to 74.  - Depakote has a half life of ~9 hours, so extrapolating the trough to a 12 hour dosing interval, depakote levels may be therapeutic to mildly supratherapeutic between 90-125. Patient is more arousable today, at baseline mentation after discontinuation on clonazepam yesterday.   Corrected valproic acid level reference: https://[email or web address].jst.go.jp/article/[email or web address]   Plan: Continue current dose.  Monitor for signs of toxicity such as sedation or tremors  If concern for oversedation persists, consider Consider check valproic acid level 11.5 hours post dose of depakote. Consider preemptive dose reduction to depakote 375mg  twice daily   Height: 6' (182.9 cm) Weight: (!) 291.7 kg (643 lb) IBW/kg (Calculated) : 77.6   Estimated Creatinine Clearance: 215.7 mL/min (by C-G formula based on SCr of 0.83 mg/dL).    No Known Allergies   Goal: Valproic acid trough <100ug/ml. Titrate to desired effect.  Dose adjustments this admission: On 4/18 depakote formulation was changed from ER tablet to depakote sprinkle 500mg  oral BID.  Thank you for allowing pharmacy to be a part of this patient's care.  , PharmD PGY1 Pharmacy Resident 05/18/2020 9:13 AM

## 2020-05-19 DIAGNOSIS — E440 Moderate protein-calorie malnutrition: Secondary | ICD-10-CM

## 2020-05-19 NOTE — Plan of Care (Signed)
Pt not oriented X4,  continue to monitor   Problem: Education: Goal: Knowledge of General Education information will improve Description: Including pain rating scale, medication(s)/side effects and non-pharmacologic comfort measures Outcome: Not Progressing   Problem: Health Behavior/Discharge Planning: Goal: Ability to manage health-related needs will improve Outcome: Not Progressing

## 2020-05-19 NOTE — Progress Notes (Signed)
Pt refused Lunch and diner, only ate Magic [employer] cup

## 2020-05-19 NOTE — Progress Notes (Signed)
   Subjective:   Patient is met this morning, responds to verbal questions.  He is currently at his baseline, responds yes to all questions.  Objective:  Vital signs in last 24 hours: Vitals:   05/18/20 0623 05/18/20 1500 05/18/20 2017 05/19/20 0451  BP: 123/78 (!) 102/55 99/81 116/67  Pulse: (!) 59 (!) 57 67 71  Resp: _0 Temp: 98 F (36.7 C) 98.6 F (37 C) 97.8 F (36.6 C) 98 F (36.7 C)  TempSrc:  Oral  Tympanic  SpO2: 98% 96% 94% 96%  Weight: (!) 291.7 kg   (!) 293.5 kg  Height:       Physical Exam Vitals and nursing note reviewed.  Head: atraumatic ENT: external ears normal Eyes: EOMI Abdominal: flat Skin: warm and dry Neuro: Eyes open spontaneously, remains at his mental baseline  Assessment/Plan:  Principal Problem:   Alzheimer's disease ([place]) Active Problems:   Aggressive behavior   Behavioral and psychological symptoms of dementia (HCC)   End of life care   Agitation   Malnutrition of moderate degree  Mr. Colquhoun is a 60 y/o M with hx of early on Alzheimer's dementia presenting from memory care unit for agitation with rapid decline in mental and functional status. He is medically stable for discharge and awaiting LTC placement.   Early onset Alzheimer's dementia Patient awaiting long term care placement, greatly appreciate SW assistance. Patient remains medically stable for discharge. He was more somnolent over past few days which has improved since stopping Klonopin.   - continue sertraline, seroquel, depakote sprinkles in ice cream - appreciate TOC working hard on placement  Prior to Admission Living Arrangement: Memory care unit Anticipated Discharge Location: Long-term care Barriers to Discharge: Placement, stable for discharge. Has been more cooperative with recent medication changes Dispo: Pending LTC placement  Dr. Edison Simon Internal Medicine PGY-1 Pager: [phone] After 5pm on weekdays and 1pm on weekends: On Call pager  [phone]  05/19/2020, 6:57 AM

## 2020-05-20 NOTE — Progress Notes (Signed)
   Subjective:   No acute overnight events. Patient refused his dinner last night. This morning, he is resting comfortably. Answers "yes" to all questions.   Objective:  Vital signs in last 24 hours: Vitals:   05/19/20 0451 05/19/20 1454 05/19/20 2051 05/20/20 0517  BP: 116/67 127/78 114/62 122/70  Pulse: 71 65 60 62  Resp: 17 18 17 17   Temp: 98 F (36.7 C) 97.7 F (36.5 C) 98 F (36.7 C) 97.9 F (36.6 C)  TempSrc: Tympanic Axillary Oral Axillary  SpO2: 96% 98% 95% 96%  Weight: (!) 293.5 kg     Height:       Physical Exam Vitals and nursing note reviewed.  Head: atraumatic ENT: external ears normal Eyes: EOMI Abdominal: flat Ext: No pitting edema Skin: warm and dry Neuro: Eyes open spontaneously, remains at his mental baseline  Assessment/Plan:  Principal Problem:   Alzheimer's disease (HCC) Active Problems:   Aggressive behavior   Behavioral and psychological symptoms of dementia (HCC)   End of life care   Agitation   Malnutrition of moderate degree  Nathan Schroeder is a 60 y/o M with hx of early on Alzheimer's dementia presenting from memory care unit for agitation with rapid decline in mental and functional status. He is medically stable for discharge and awaiting LTC placement.   Early onset Alzheimer's dementia Patient awaiting long term care placement, greatly appreciate SW assistance. Patient remains medically stable for discharge. Klonopin discontinued yesterday. No signs of benzo withdrawal on examination  - continue sertraline, seroquel, depakote sprinkles in ice cream - appreciate TOC working hard on placement  Prior to Admission Living Arrangement: Memory care unit Anticipated Discharge Location: Long-term care Barriers to Discharge: Placement, stable for discharge. Has been more cooperative with recent medication changes  Dispo: Pending LTC placement  Dr. 77 Internal Medicine PGY-2  Pager: [phone] After 5pm on weekdays and 1pm on  weekends: On Call pager [phone]  05/20/2020, 7:17 AM

## 2020-05-21 NOTE — Progress Notes (Signed)
   Subjective:   Patient somnolent but responds to verbal and physical stimuli. When woken remains at his mental baseline, responds with "yeah" to all questions.  Objective:  Vital signs in last 24 hours: Vitals:   05/19/20 1454 05/19/20 2051 05/20/20 0517 05/21/20 0623  BP: 127/78 114/62 122/70 119/73  Pulse: 65 60 62 (!) 55  Resp: 18 17 17 18   Temp: 97.7 F (36.5 C) 98 F (36.7 C) 97.9 F (36.6 C) 98.5 F (36.9 C)  TempSrc: Axillary Oral Axillary Oral  SpO2: 98% 95% 96% 99%  Weight:      Height:       Physical Exam Vitals and nursing note reviewed.  Head: atraumatic ENT: external ears normal Eyes: EOMI Abdominal: flat Ext: No pitting edema Skin: warm and dry Neuro: Eyes open spontaneously, remains at his mental baseline  Assessment/Plan:  Principal Problem:   Alzheimer's disease (HCC) Active Problems:   Aggressive behavior   Behavioral and psychological symptoms of dementia (HCC)   End of life care   Agitation   Malnutrition of moderate degree  Nathan Schroeder is a 60 y/o M with hx of early on Alzheimer's dementia presenting from memory care unit for agitation with rapid decline in mental and functional status. He is medically stable for discharge and awaiting LTC placement.   Early onset Alzheimer's dementia Patient awaiting long term care placement, greatly appreciate SW assistance. Patient remains medically stable for discharge. Klonopin discontinued yesterday. No signs of benzo withdrawal on examination  - continue sertraline, seroquel, depakote sprinkles in ice cream - appreciate TOC working hard on placement  Prior to Admission Living Arrangement: Memory care unit Anticipated Discharge Location: Long-term care Barriers to Discharge: Placement, stable for discharge. Has been more cooperative with recent medication changes  Dispo: Pending LTC placement  Dr. 77 Internal Medicine PGY-1  Pager: [phone] After 5pm on weekdays and 1pm on weekends: On  Call pager [phone]  05/21/2020, 7:52 AM

## 2020-05-21 NOTE — Progress Notes (Signed)
Physical Therapy Treatment Patient Details Name: Nathan Schroeder MRN: [id number] DOB: 1960/12/13 Today's Date: 05/21/2020    History of Present Illness 60 y.o. male admitted on 03/10/20 for dementia with behavioral disturbance.  Pt from [facility].  Pt with significant PMH of HTN, anxiety, Alzheimer's disease.    PT Comments    Continuing work on functional mobility and activity tolerance;  Still requiring Max to Total assist for mobility and ADLs, but noteworthy that pt has been far less agitated for the past few sessions; Decr ability to participate, goals updated   Follow Up Recommendations  SNF     Equipment Recommendations  Hospital bed;Wheelchair (measurements PT);Wheelchair cushion (measurements PT);Other (comment) (mechanical lift)    Recommendations for Other Services       Precautions / Restrictions Precautions Precautions: Fall Precaution Comments: advanced dementia with behavior disturbance    Mobility  Bed Mobility Overal bed mobility: Needs Assistance Bed Mobility: Rolling Rolling: +2 for physical assistance;Max assist         General bed mobility comments: Rolled for placement of Maximove pad    Transfers Overall transfer level: Needs assistance Equipment used: Ambulation equipment used             General transfer comment: Used Maximove, mechanical lift, for safe transfer OOB to recliner  Ambulation/Gait                 Stairs             Wheelchair Mobility    Modified Rankin (Stroke Patients Only)       Balance                                            Cognition Arousal/Alertness: Awake/alert Behavior During Therapy: Flat affect Overall Cognitive Status: History of cognitive impairments - at baseline                                 General Comments: advanced dementia at baseline with behavioral disturbance.      Exercises      General Comments         Pertinent Vitals/Pain Pain Assessment: Faces Faces Pain Scale: Hurts a little bit Pain Location: bil feet against foot of bed Pain Descriptors / Indicators: Grimacing Pain Intervention(s): Monitored during session    Home Living                      Prior Function            PT Goals (current goals can now be found in the care plan section) Acute Rehab PT Goals Patient Stated Goal: unable to state PT Goal Formulation: Patient unable to participate in goal setting Time For Goal Achievement: 06/04/20 Potential to Achieve Goals: Fair Progress towards PT goals: Not progressing toward goals - comment;Goals downgraded-see care plan (decr ability to participate)    Frequency    Min 2X/week      PT Plan Current plan remains appropriate    Co-evaluation              AM-PAC PT "6 Clicks" Mobility   Outcome Measure  Help needed turning from your back to your side while in a flat bed without using bedrails?: A Lot Help needed moving from lying on your  back to sitting on the side of a flat bed without using bedrails?: Total Help needed moving to and from a bed to a chair (including a wheelchair)?: Total Help needed standing up from a chair using your arms (e.g., wheelchair or bedside chair)?: Total Help needed to walk in hospital room?: Total Help needed climbing 3-5 steps with a railing? : Total 6 Click Score: 7    End of Session   Activity Tolerance: Patient tolerated treatment well;Patient limited by fatigue Patient left: in chair;with call bell/phone within reach;with chair alarm set;Other (comment) (belt-type chair alarm) Nurse Communication: Mobility status;Need for lift equipment PT Visit Diagnosis: Adult, failure to thrive (R62.7);Other abnormalities of gait and mobility (R26.89)     Time: 5009-3818 PT Time Calculation (min) (ACUTE ONLY): 24 min  Charges:  $Therapeutic Activity: 23-37 mins                     Van Clines, [place]  Acute  Rehabilitation Services Pager [phone] Office [phone]    Levi Aland 05/21/2020, 6:19 PM

## 2020-05-21 NOTE — Progress Notes (Signed)
CSW spoke with Diplomatic Services operational officer at [facility] in [place] who states the facility is assisted living with memory care - she was agreeable to receive referral and give it to admissions for review. CSW faxed clinicals to [phone].  Edwin Dada, MSW, LCSW Transitions of Care  Clinical Social Worker II [phone]

## 2020-05-21 NOTE — TOC Progression Note (Addendum)
Transition of Care [facility]) - Progression Note    Patient Details  Name: Nathan Schroeder MRN: [id number] Date of Birth: 12/02/1960  Transition of Care [facility]) CM/SW Contact  Janae Bridgeman, RN Phone Number: 05/21/2020, 10:51 AM  Clinical Narrative:    Case management spoke with Will, CM at [facility] and the facility states that they are recalling the offer to admit the patient to the facility.  [facility] states that they are unable to meet the needs of the patient and are unable to accept the patient for admission.  MSW and CM will continue to search for [facility] facilities for admission.  Toniann Fail, CM with [facility] is going to review the patient's clinicals at this time.  05/21/2020 1433 - CM called SNf facilities for possible admission including: [facility] - No beds [facility] - CM at facility is viewing clinicals for possible admission [facility] - left message [facility] (251.00 / day) - emailed clinicals to [email or web address] [facility] - left message [facility] - left message [facility] - left message [facility] - left message [place] - 245.00 / day - emailed clinicals to [email or web address]  CM will continue to search for admission availability for the patient at a SNF facility.  05/21/2020 1448 - Kim at [facility] is unable to offer an admission bed to the patient - since the facility is unable to meet care needs for the patient.  Expected Discharge Plan: Long Term Nursing Home Barriers to Discharge: Continued Medical Work up,Unsafe home situation  Expected Discharge Plan and Services Expected Discharge Plan: Long Term Nursing Home In-house Referral: Clinical Social Work Discharge Planning Services: CM Consult Post Acute Care Choice: Skilled Nursing Facility Living arrangements for the past 2 months: Assisted Living Facility                                        Social Determinants of Health (SDOH) Interventions    Readmission Risk Interventions No flowsheet data found.

## 2020-05-22 NOTE — Progress Notes (Signed)
   Subjective:   No acute events overnight. Patient is alert this morning, responds "yes" to most questions.  Appears to be comfortable.  Objective:  Vital signs in last 24 hours: Vitals:   05/21/20 1446 05/21/20 1509 05/21/20 2000 05/22/20 0350  BP: 112/72 119/73 (!) 149/84 (!) 146/78  Pulse: 71 74  78  Resp: 12 18 18 18   Temp: 97.9 F (36.6 C) 97.7 F (36.5 C) 98.2 F (36.8 C) 98 F (36.7 C)  TempSrc: Oral Oral Oral Oral  SpO2: 96% 96% 99% 96%  Weight:      Height:       Physical Exam Vitals and nursing note reviewed.  Head: atraumatic ENT: external ears normal Eyes: EOMI Abdominal: flat Ext: No pitting edema Skin: warm and dry Neuro: Eyes open spontaneously, remains at his mental baseline  Assessment/Plan:  Principal Problem:   Alzheimer's disease (HCC) Active Problems:   Aggressive behavior   Behavioral and psychological symptoms of dementia (HCC)   End of life care   Agitation   Malnutrition of moderate degree  Nathan Schroeder is a 60 y/o M with hx of early on Alzheimer's dementia presenting from memory care unit for agitation with rapid decline in mental and functional status. He is medically stable for discharge and awaiting LTC placement.   Early onset Alzheimer's dementia Patient awaiting long term care placement, greatly appreciate SW assistance. Patient remains medically stable for discharge. Klonopin discontinued yesterday. No signs of benzo withdrawal on examination  - continue sertraline, seroquel, depakote sprinkles in ice cream - appreciate TOC working hard on placement  Prior to Admission Living Arrangement: Memory care unit Anticipated Discharge Location: Long-term care Barriers to Discharge: Placement, stable for discharge. Has been more cooperative with recent medication changes  Dispo: Pending LTC placement  Dr. 77 Internal Medicine PGY-1  Pager: [phone] After 5pm on weekdays and 1pm on weekends: On Call pager  [phone]  05/22/2020, 6:44 AM
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
## 2020-05-23 MED ORDER — PROSOURCE PLUS PO LIQD
30.0000 mL | Freq: Three times a day (TID) | ORAL | Status: DC
  Administered 2020-05-23 – 2020-05-27 (×6): 30 mL via ORAL
  Filled 2020-05-23 (×9): qty 30

## 2020-05-23 NOTE — Progress Notes (Signed)
   Subjective:   No acute events overnight. Patient is alert, remains at his mental baseline. Responds "yes" to most questions.  Bladder scan of 0 yesterday.  Objective:  Vital signs in last 24 hours: Vitals:   05/21/20 1800 05/21/20 2000 05/22/20 0350 05/22/20 1405  BP:  (!) 149/84 (!) 146/78 116/69  Pulse:   78 (!) 56  Resp:  18 18 16   Temp:  98.2 F (36.8 C) 98 F (36.7 C) 97.7 F (36.5 C)  TempSrc:  Oral Oral Oral  SpO2:  99% 96% 98%  Weight: 103.9 kg     Height:       Physical Exam Vitals and nursing note reviewed.  Head: atraumatic ENT: external ears normal Eyes: EOMI Abdominal: flat Ext: No pitting edema Skin: warm and dry Neuro: alert, remains at his mental baseline  Assessment/Plan:  Principal Problem:   Alzheimer's disease (HCC) Active Problems:   Aggressive behavior   Behavioral and psychological symptoms of dementia (HCC)   End of life care   Agitation   Malnutrition of moderate degree  Nathan Schroeder is a 61 y/o M with hx of early on Alzheimer's dementia presenting from memory care unit for agitation with rapid decline in mental and functional status. He is medically stable for discharge and awaiting LTC placement.   Early onset Alzheimer's dementia Patient awaiting long term care placement, greatly appreciate SW assistance. Patient remains medically stable for discharge. Klonopin discontinued yesterday. No signs of benzo withdrawal on examination  - continue sertraline, seroquel, depakote sprinkles in ice cream - appreciate TOC working hard on placement  Prior to Admission Living Arrangement: Memory care unit Anticipated Discharge Location: Long-term care Barriers to Discharge: Placement, stable for discharge. Has been more cooperative with recent medication changes  Dispo: Pending LTC placement  Dr. 77 Internal Medicine PGY-1  Pager: [phone] After 5pm on weekdays and 1pm on weekends: On Call pager [phone]  05/23/2020, 7:37 AM

## 2020-05-23 NOTE — Progress Notes (Signed)
[facility] ([facility])      This patient has been referred for hospice services after discharge.  [facility] will continue to follow for any discharge planning needs and to coordinate admission onto hospice care, as appropriate.   Thank you for the opportunity to participate in this patient's care.     Chrislyn King, BSN, RN [facility] Hospital Liaison [phone]   [phone] (24h on call)     

## 2020-05-23 NOTE — Progress Notes (Signed)
PT Discharge Note  Patient Details Name: Nathan Schroeder MRN: [id number] DOB: 11/03/1960    While we have noted that he has been much more pleasant, without combative outburstst, [name] has been unable to participate in the last few PT sessions;   He continues to require total care for mobility and ADLs;   Recommend mechanical lift for safe transfers;   Agree with SNF for LTC;   Will sign off as his progress has plateaud, and he is unable to participate in PT;   Van Clines, [place]  [facility] Services Pager [phone] Office [phone]    Nathan Schroeder 05/23/2020, 12:45 PM

## 2020-05-23 NOTE — Progress Notes (Signed)
Pt declined prosource as ordered this morning.

## 2020-05-23 NOTE — Progress Notes (Signed)
Nutrition Follow-up  DOCUMENTATION CODES:   Non-severe (moderate) malnutrition in context of chronic illness  INTERVENTION:   -Continue MVI with minerals daily -Continue Ensure Enlive po TID, each supplement provides 350 kcal and 20 grams of protein -Continue Magic cup TID with meals, each supplement provides 290 kcal and 9 grams of protein -30 ml Prosource Plus TID, each supplement provides 100 kcals and 15 grams protein -Continue feeding assistance with meals -Continue finger foods diet; RD to order meals based upon pt preferences  NUTRITION DIAGNOSIS:   Inadequate oral intake related to decreased appetite as evidenced by meal completion < 50%.  Ongoing  GOAL:   Patient will meet greater than or equal to 90% of their needs  Progressing   MONITOR:   PO intake,Supplement acceptance,Labs,Weight trends,Skin,I & O's  REASON FOR ASSESSMENT:   Consult Assessment of nutrition requirement/status  ASSESSMENT:   Nathan Schroeder is a 60 y/o male with a history of early onset alzheimer's disease presenting from memory care unit for aggressive behavior with rapid decline in he mental and functional status.  Reviewed I/O's: 0 ml x 24 hours and +145 ml since 05/09/20  Pt with variable intake. Noted meal completion 0-75%. Per nursing notes, pt will consume Magic Cups, but often refuse food items on meal trays. Pt also doing well with Ensure supplements, consuming 2-3 per day per MAR. Staff continues to assist with feeding. RD has been ordering pt meals per his food preferences.   Reviewed wt hx; pt has experienced a 16.8% wt loss over the past 2 months, which is significant for time frame.   Pt is medically stable for discharge. Per TOC notes, SNF placement search continues.   Labs reviewed.   Diet Order:   Diet Order            DIET FINGER FOODS Room service appropriate? No; Fluid consistency: Thin  Diet effective now                 EDUCATION NEEDS:   No education needs have  been identified at this time  Skin:  Skin Assessment: Reviewed RN Assessment  Last BM:  05/21/20  Height:   Ht Readings from Last 1 Encounters:  05/12/20 6' (1.829 m)    Weight:   Wt Readings from Last 1 Encounters:  05/21/20 103.9 kg    Ideal Body Weight:  80.9 kg  BMI:  Body mass index is 31.06 kg/m.  Estimated Nutritional Needs:   Kcal:  2200-2400  Protein:  120-135 grams  Fluid:  > 2 L    Levada Schilling, RD, LDN, CDCES Registered Dietitian II Certified Diabetes Care and Education Specialist Please refer to [facility] for RD and/or RD on-call/weekend/after hours pager

## 2020-05-24 NOTE — Progress Notes (Addendum)
   Subjective:   Patient alert this morning. Remains at his mental baseline. Responds "Yes" to most uqestions.  Objective:  Vital signs in last 24 hours: Vitals:   05/22/20 0350 05/22/20 1405 05/23/20 1322 05/24/20 0542  BP: (!) 146/78 116/69  115/62  Pulse: 78 (!) 56 (!) 56 60  Resp: 18 16 15 16   Temp: 98 F (36.7 C) 97.7 F (36.5 C) 97.7 F (36.5 C) 97.7 F (36.5 C)  TempSrc: Oral Oral Oral Axillary  SpO2: 96% 98% 99% 100%  Weight:      Height:       Physical Exam Vitals and nursing note reviewed.  Head: atraumatic ENT: external ears normal Eyes: EOMI Abdominal: flat Ext: No pitting edema Skin: warm and dry Neuro: alert, remains at his mental baseline  Assessment/Plan:  Principal Problem:   Alzheimer's disease (HCC) Active Problems:   Aggressive behavior   Behavioral and psychological symptoms of dementia (HCC)   End of life care   Agitation   Malnutrition of moderate degree  Mr. Fleming is a 60 y/o M with hx of early on Alzheimer's dementia presenting from memory care unit for agitation with rapid decline in mental and functional status. He is medically stable for discharge and awaiting LTC placement.   Advanced Alzheimer's dementia, early onset  Patient awaiting long term care placement, greatly appreciate SW assistance. Patient remains medically stable for discharge. Klonopin discontinued due to sedation. No signs of benzo withdrawal on examination. Patient was "palliated" earlier this hospitalization. He is DNR and his medicine regimen has been minimized to include only those necessary to manage symptoms and promote quality of life. At this point, mostly comfort care with the exception of a few medications for depression, delirium, and agitation which have been working well for him.    - continue sertraline, seroquel, depakote sprinkles in ice cream - appreciate TOC working hard on placement, referred [facility] services with [facility] and also continuing to  look into long term care facilities  Prior to Admission Living Arrangement: Memory care unit Anticipated Discharge Location: Long-term care Barriers to Discharge: Placement, stable for discharge. Has been more cooperative with recent medication changes  Dispo: Pending LTC placement  Dr. 77 Internal Medicine PGY-1  Pager: [phone] After 5pm on weekdays and 1pm on weekends: On Call pager [phone]  05/24/2020, 6:42 AM

## 2020-05-24 NOTE — Progress Notes (Addendum)
CSW spoke with Rasheema at [facility] to discuss patient - new notes sent for review.  CSW spoke with Olegario Messier of [facility] who states she will review the patient for possible placement at [facility].  CSW spoke with Tammy at [facility] - there are long term beds available at this time.  CSW spoke with Rosey Bath at [facility] - there are long term beds available at this time.  Edwin Dada, MSW, LCSW Transitions of Care  Clinical Social Worker II [phone]

## 2020-05-25 NOTE — Progress Notes (Addendum)
2pm: CSW received call from Sandrea Matte at [facility] in [place] - unfortunately there are no long term beds available at this time, only one rehab bed due to construction at the facility. Clide Cliff reports the price were day will increase to $260 per day on 06/27/20.  CSW attempted to reach Rasheema at [facility] - no answer, text message sent requesting a return call.  9am: CSW spoke with Diplomatic Services operational officer at [facility] in [place] - the daily private pay cost is $220 daily plus the cost of supplies and medications. CSW informed patient's son of pricing information.  CSW spoke with Meryle Ready at [facility] in [place] - the monthly private pay rate is $7,200. CSW sent information in the hub for review - patient denied due to previously documented behaviors.  Edwin Dada, MSW, LCSW Transitions of Care  Clinical Social Worker II [phone]

## 2020-05-25 NOTE — TOC Progression Note (Signed)
Transition of Care [facility]) - Progression Note    Patient Details  Name: INRI SOBIESKI MRN: [id number] Date of Birth: 07-02-1960  Transition of Care [facility]) CM/SW Contact  Janae Bridgeman, RN Phone Number: 05/25/2020, 2:36 PM  Clinical Narrative:    CM spoke with Rashima, CM at [facility] and the facility will have an available SNF bed most likely on Monday, 05/28/2020 available for admission to the patient.  Edwin Dada spoke with [facility] in [place] and they do not have available beds at this time and were unable to offer a bed to the patient but may have available long term beds in the near future.  I called Jeanella Craze, son and updated him on the information and CM and MSW with difficult to place team will plan for possible transfer to [facility] on Monday.  CM and MSW will continue to follow the patient for SNF placement for LTC.   Expected Discharge Plan: Long Term Nursing Home Barriers to Discharge: Continued Medical Work up,Unsafe home situation  Expected Discharge Plan and Services Expected Discharge Plan: Long Term Nursing Home In-house Referral: Clinical Social Work Discharge Planning Services: CM Consult Post Acute Care Choice: Skilled Nursing Facility Living arrangements for the past 2 months: Assisted Living Facility                                       Social Determinants of Health (SDOH) Interventions    Readmission Risk Interventions No flowsheet data found.

## 2020-05-25 NOTE — Progress Notes (Signed)
   Subjective:   Patient sleeping comfortably.  Appears as mental baseline.  Noted to be cooperative over the past few weeks after medication adjustments, there have been no reports of problematic behavior in about 2 weeks now  Objective:  Vital signs in last 24 hours: Vitals:   05/22/20 1405 05/23/20 1322 05/24/20 0542 05/24/20 1530  BP: 116/69  115/62 127/68  Pulse: (!) 56 (!) 56 60 (!) 52  Resp: 16 15 16 14   Temp: 97.7 F (36.5 C) 97.7 F (36.5 C) 97.7 F (36.5 C) 97.7 F (36.5 C)  TempSrc: Oral Oral Axillary Axillary  SpO2: 98% 99% 100% 98%  Weight:      Height:       Physical Exam Vitals and nursing note reviewed.  Head: atraumatic ENT: external ears normal Eyes: EOMI Abdominal: flat Ext: No pitting edema Skin: warm and dry Neuro: alert, remains at his mental baseline  Assessment/Plan:  Principal Problem:   Alzheimer's disease (HCC) Active Problems:   Aggressive behavior   Behavioral and psychological symptoms of dementia (HCC)   End of life care   Agitation   Malnutrition of moderate degree  Nathan Schroeder is a 60 y/o M with hx of early on Alzheimer's dementia presenting from memory care unit for agitation with rapid decline in mental and functional status. He is medically stable for discharge and awaiting LTC placement.   Advanced Alzheimer's dementia, early onset  Patient awaiting long term care placement, greatly appreciate SW assistance. Patient remains medically stable for discharge. Klonopin discontinued due to sedation. No signs of benzo withdrawal on examination. Patient was "palliated" earlier this hospitalization. He is DNR and his medicine regimen has been minimized to include only those necessary to manage symptoms and promote quality of life. At this point, mostly comfort care with the exception of a few medications for depression, delirium, and agitation which have been working well for him.    - continue sertraline, seroquel, depakote sprinkles in ice  cream - appreciate TOC working hard on placement  Prior to Admission Living Arrangement: Memory care unit Anticipated Discharge Location: Long-term care Barriers to Discharge: Placement, stable for discharge. Has been more cooperative with recent medication changes  Dispo: Pending LTC placement  Dr. 77 Internal Medicine PGY-1  Pager: [phone] After 5pm on weekdays and 1pm on weekends: On Call pager [phone]  05/25/2020, 7:31 AM

## 2020-05-26 NOTE — Plan of Care (Signed)

## 2020-05-26 NOTE — Progress Notes (Signed)
   Subjective:   Patient remains at mental baseline. Eyes track appropriately, answers "yes" or "okay" to all questions.  Objective:  Vital signs in last 24 hours: Vitals:   05/24/20 1530 05/25/20 1604 05/25/20 2049 05/26/20 0549  BP: 127/68 (!) 134/56 121/70 128/69  Pulse: (!) 52 (!) 57 (!) 57 (!) 54  Resp: 14 15 16 17   Temp: 97.7 F (36.5 C) 98.3 F (36.8 C) 98.1 F (36.7 C) 98.5 F (36.9 C)  TempSrc: Axillary Oral Oral Oral  SpO2: 98% 98% 100% 100%  Weight:      Height:       Physical Exam Vitals and nursing note reviewed.  Head: atraumatic ENT: external ears normal Eyes: EOMI Abdominal: flat Ext: No pitting edema Skin: warm and dry Neuro: alert, remains at his mental baseline  Assessment/Plan:  Principal Problem:   Alzheimer's disease (HCC) Active Problems:   Aggressive behavior   Behavioral and psychological symptoms of dementia (HCC)   End of life care   Agitation   Malnutrition of moderate degree  Nathan Schroeder is a 60 y/o M with hx of early on Alzheimer's dementia presenting from memory care unit for agitation with rapid decline in mental and functional status. He is medically stable for discharge and awaiting LTC placement.   Advanced Alzheimer's dementia, early onset  Patient remains medically stable for discharge. Klonopin discontinued due to sedation. No signs of benzo withdrawal on examination. Patient was "palliated" earlier this hospitalization. He is DNR and his medicine regimen has been minimized to include only those necessary to manage symptoms and promote quality of life. At this point, mostly comfort care with the exception of a few medications for depression, delirium, and agitation which have been working well for him.    - continue sertraline, seroquel, depakote sprinkles in ice cream - appreciate TOC working hard on placement  Prior to Admission Living Arrangement: Memory care unit Anticipated Discharge Location: Long-term care Barriers to  Discharge: Placement, stable for discharge. Has been more cooperative with recent medication changes  Dispo: Pending LTC placement  Dr. 77 Internal Medicine PGY-1  Pager: [phone] After 5pm on weekdays and 1pm on weekends: On Call pager [phone]  05/26/2020, 6:51 AM

## 2020-05-27 NOTE — Progress Notes (Signed)
    Subjective:   Patient remains at mental baseline. Eyes track appropriately, answers all questions as "Ya" and yes.   Objective:  Vital signs in last 24 hours: Vitals:   05/26/20 0549 05/26/20 1324 05/26/20 2114 05/27/20 0500  BP: 128/69 122/74 127/73 131/69  Pulse: (!) 54 (!) 54 66 70  Resp: 17 19 17 17   Temp: 98.5 F (36.9 C)  98.2 F (36.8 C) 98.1 F (36.7 C)  TempSrc: Oral   Tympanic  SpO2: 100% 99%  99%  Weight:      Height:       Physical Exam Vitals and nursing note reviewed.  Head: atraumatic ENT: external ears normal Eyes: EOMI Abdominal: flat Ext: No pitting edema Skin: warm and dry Neuro: awake, alert, remains at his mental baseline  Assessment/Plan:  Principal Problem:   Alzheimer's disease (HCC) Active Problems:   Aggressive behavior   Behavioral and psychological symptoms of dementia (HCC)   End of life care   Agitation   Malnutrition of moderate degree  Nathan Schroeder is a 60 y/o M with hx of early on Alzheimer's dementia presenting from memory care unit for agitation with rapid decline in mental and functional status. He is medically stable for discharge and awaiting LTC placement.   Advanced Alzheimer's dementia, early onset  Patient remains medically stable for discharge. Klonopin discontinued due to sedation. No signs of benzo withdrawal on examination. Patient was "palliated" earlier this hospitalization. He is DNR and his medicine regimen has been minimized to include only those necessary to manage symptoms and promote quality of life. At this point, mostly comfort care with the exception of a few medications for depression, delirium, and agitation which have been working well for him.    - continue sertraline, seroquel, depakote sprinkles in ice cream - appreciate TOC working hard on placement. As per Case manager note, Pt may have bed availability at [facility] on Monday. Will F/U on that.   Prior to Admission Living Arrangement: Memory care  unit Anticipated Discharge Location: Long-term care Barriers to Discharge: Placement, stable for discharge. Has been more cooperative with recent medication changes  Dispo: Pending LTC placement  Dr. Friday MD Internal Medicine PGY-1  On Call Pager: [phone]   05/27/2020, 1:10 PM

## 2020-05-27 NOTE — Progress Notes (Signed)
[facility])      This patient has been referred for hospice services after discharge.  [facility] will continue to follow for any discharge planning needs and to coordinate admission onto hospice care, as appropriate.   Thank you for the opportunity to participate in this patient's care.     Gillian Scarce, BSN, RN [facility] Liaison [phone]   [phone] (24h on call)

## 2020-05-28 DIAGNOSIS — G309 Alzheimer's disease, unspecified: Secondary | ICD-10-CM | POA: Diagnosis not present

## 2020-05-28 DIAGNOSIS — R278 Other lack of coordination: Secondary | ICD-10-CM | POA: Diagnosis not present

## 2020-05-28 LAB — SARS CORONAVIRUS 2 (TAT 6-24 HRS): SARS Coronavirus 2: NEGATIVE

## 2020-05-28 MED ORDER — DIVALPROEX SODIUM 125 MG PO CSDR: 500.0000 mg | DELAYED_RELEASE_CAPSULE | Freq: Two times a day (BID) | ORAL | Status: AC

## 2020-05-28 MED ORDER — SERTRALINE HCL 50 MG PO TABS: 75.0000 mg | ORAL_TABLET | Freq: Every day | ORAL | Status: AC

## 2020-05-28 NOTE — Progress Notes (Signed)
Report called to [facility]. Gave report to [employer].

## 2020-05-28 NOTE — Progress Notes (Signed)
Brief Palliative Medicine Progress Note:  Chart review performed. Noted patient is discharging today to [facility].   Notified [facility] liaison/Mary Thurston Hole that patient will be discharging today so they can assist with coordinating admission to hospice services after discharge.  I greatly appreciate TOC's continued efforts in finding Mr. Monteforte an appropriate and safe discharge location.  Thank you for allowing PMT to assist in the care of this patient.  Naftoli Penny M. Katrinka Blazing [facility] Palliative Medicine Team Team Phone: [phone] NO CHARGE

## 2020-05-28 NOTE — Progress Notes (Signed)
    Subjective:   Patient remains at mental baseline. Eyes track appropriately, answers all questions as "Ya" and yes to all questions. Not oriented to place, person and time.   Objective:  Vital signs in last 24 hours: Vitals:   05/27/20 0500 05/27/20 1500 05/27/20 2149 05/28/20 0427  BP: 131/69 126/64 117/77 112/73  Pulse: 70 62 (!) 56 62  Resp: 17 18 16 16   Temp: 98.1 F (36.7 C) 98.6 F (37 C) 97.7 F (36.5 C) 97.8 F (36.6 C)  TempSrc: Tympanic Axillary Oral   SpO2: 99% 100% 100% 100%  Weight:    99.8 kg  Height:       Physical Exam Vitals and nursing note reviewed.  Head: atraumatic ENT: external ears normal Eyes: EOMI Abdominal: flat Ext: No pitting edema Skin: warm and dry Neuro: awake, alert, remains at his mental baseline  Assessment/Plan:  Principal Problem:   Alzheimer's disease (HCC) Active Problems:   Aggressive behavior   Behavioral and psychological symptoms of dementia (HCC)   End of life care   Agitation   Malnutrition of moderate degree  Nathan Schroeder is a 60 y/o M with hx of early on Alzheimer's dementia presenting from memory care unit for agitation with rapid decline in mental and functional status. He is medically stable for discharge and awaiting LTC placement.   Advanced Alzheimer's dementia, early onset  Patient remains medically stable for discharge. Klonopin discontinued due to sedation. No signs of benzo withdrawal on examination. Patient was "palliated" earlier this hospitalization. He is DNR and his medicine regimen has been minimized to include only those necessary to manage symptoms and promote quality of life. At this point, mostly comfort care with the exception of a few medications for depression, delirium, and agitation which have been working well for him.    - continue sertraline, seroquel, depakote sprinkles in ice cream - appreciate TOC working hard on placement. As per Case manager note, Pt may have bed availability at [facility]. Will F/U.  Prior to Admission Living Arrangement: Memory care unit Anticipated Discharge Location: Long-term care Barriers to Discharge: Placement, stable for discharge. Has been more cooperative with recent medication changes  Dispo: Pending LTC placement  Dr. [facility] MD Internal Medicine PGY-1  On Call Pager: [phone]   05/28/2020, 5:50 AM

## 2020-05-28 NOTE — TOC Transition Note (Signed)
Transition of Care [facility]) - CM/SW Discharge Note   Patient Details  Name: Nathan Schroeder MRN: [id number] Date of Birth: 1960/08/28  [facility]) CM/SW Contact:  Nathan Bridgeman, RN Phone Number: 05/28/2020, 2:06 PM   Clinical Narrative:    Case management spoke with Nathan Schroeder, MSW and patient has an available bed for discharge to [facility] today.  Nathan Schroeder, MSW spoke with the patient's son on the phone and the family is agreeable to SNF placement at [facility] today and [name] to follow up with the family.  COVID results are pending at this time.  [facility] was called and transportation was arranged to the facility.  [facility] should arrive to the med-surg floor to transport the patient around 4-5 pm today.    CM and MSW will continue to follow for discharge to the facility.   Final next level of care: Long Term Nursing Home Barriers to Discharge: Continued Medical Work up,Unsafe home situation   Patient Goals and CMS Choice Patient states their goals for this hospitalization and ongoing recovery are:: [name] family is requesting LTC placement - family unable to care for patient safely at home. CMS Medicare.gov Compare Post Acute Care list provided to:: Patient Represenative (must comment) ([name] son, Nathan Schroeder) Choice offered to / list presented to : Adult Children  Discharge Placement                       Discharge Plan and Services In-house Referral: Clinical Social Work Discharge Planning Services: CM Consult Post Acute Care Choice: Skilled Nursing Facility                               Social Determinants of Health (SDOH) Interventions     Readmission Risk Interventions No flowsheet data found.

## 2020-05-28 NOTE — Discharge Instructions (Signed)
You are on the following medications: Depakote sprinkles 500 mg BID, Seroquel 100 Bid, Zoloft 75 mg Daily.  Pt is transported to [facility] living facility.

## 2020-05-28 NOTE — Progress Notes (Signed)
Pt discharged to [facility] this evening. Transported by [facility] in stable condition for discharge

## 2020-05-28 NOTE — Progress Notes (Addendum)
Patient will go to room 221, south hall at [facility] - he will be transported via [place], transportation has been arranged for pick up for first available. The number to call report is [phone], RN is Victorino Dike.  [name] family aware and agreeable to discharge plan.  Edwin Dada, MSW, LCSW Transitions of Care  Clinical Social Worker II [phone]

## 2020-05-30 DIAGNOSIS — G309 Alzheimer's disease, unspecified: Secondary | ICD-10-CM | POA: Diagnosis not present

## 2020-05-30 DIAGNOSIS — R69 Illness, unspecified: Secondary | ICD-10-CM | POA: Diagnosis not present

## 2020-05-30 DIAGNOSIS — R278 Other lack of coordination: Secondary | ICD-10-CM | POA: Diagnosis not present

## 2020-06-15 ENCOUNTER — Other Ambulatory Visit: Payer: Self-pay

## 2020-06-15 ENCOUNTER — Non-Acute Institutional Stay: Payer: 59 | Admitting: Hospice

## 2020-06-15 DIAGNOSIS — I10 Essential (primary) hypertension: Secondary | ICD-10-CM | POA: Diagnosis not present

## 2020-06-15 DIAGNOSIS — Z515 Encounter for palliative care: Secondary | ICD-10-CM

## 2020-06-15 DIAGNOSIS — R69 Illness, unspecified: Secondary | ICD-10-CM | POA: Diagnosis not present

## 2020-06-15 DIAGNOSIS — R451 Restlessness and agitation: Secondary | ICD-10-CM

## 2020-06-15 DIAGNOSIS — E119 Type 2 diabetes mellitus without complications: Secondary | ICD-10-CM | POA: Diagnosis not present

## 2020-06-15 DIAGNOSIS — F0391 Unspecified dementia with behavioral disturbance: Secondary | ICD-10-CM

## 2020-06-15 DIAGNOSIS — Z79899 Other long term (current) drug therapy: Secondary | ICD-10-CM | POA: Diagnosis not present

## 2020-06-15 NOTE — Progress Notes (Addendum)
[facility] Consult Note Telephone: [phone]  Fax: [phone]  PATIENT NAME: Nathan Schroeder [place] [address] [id number] (home)  DOB: 1960/10/01 MRN: [id number]  PRIMARY CARE PROVIDER:    Dr. Seward Carol  REFERRING PROVIDER:   Dr. Seward Carol RESPONSIBLE PARTY:   Self/spouse Contact Information    Name Relation Home Work Mobile   Nathan Schroeder Spouse [phone]     Nathan Schroeder, Nathan Schroeder   [phone]     I met face to face with patient and family at facility. Palliative Care was asked to follow this patient by consultation request of  Dr. Seward Carol.  to address advance care planning, complex medical decision making and goals of care clarification. This is the initial visit.  NP called Nathan Schroeder and son Nathan Schroeder and left a voicemail with callback number. [place] later called back and was updated on patient's status. He endorsed palliative service and expressed appreciation for the call.     ASSESSMENT AND / RECOMMENDATIONS:   Advance Care Planning: Our advance care planning conversation included a discussion about:     The value and importance of advance care planning   Difference between Hospice and Palliative care  Exploration of goals of care in the event of a sudden injury or illness   Identification and preparation of a healthcare agent   Review and updating or creation of an  advance directive document .   CODE STATUS: Patient is a DO NOT RESUSCITATE  Goals of Care: Goals include to maximize quality of life and symptom management  I spent 16 minutes providing this initial consultation. More than 50% of the time in this consultation was spent on counseling patient and coordinating communication. --------------------------------------------------------------------------------------------------------------------------------------  Symptom Management/Plan: Agitation: Related to  early onset Alzheimer dementia.  Continue Depakote sprinkle, quetiapine.  Utilize de-escalation techniques, distraction with patient.  Followed by psych.  Routine CBC BMP, Depakote level. Depression: Continue Zoloft. Dementia: Ongoing memory loss and confusion.  FLACC 0.  FAST 6D Follow up: Palliative care will continue to follow for complex medical decision making, advance care planning, and clarification of goals. Return 6 weeks or prn.Encouraged to call provider sooner with any concerns.   Family /Caregiver/Community Supports: Patient lives in SNF for ongoing care  PPS: 40%  HOSPICE ELIGIBILITY/DIAGNOSIS: TBD  Chief Complaint: Initial Palliative care visit:Agitation  HISTORY OF PRESENT ILLNESS:  Nathan Schroeder is a 60 y.o. year old male  with multiple medical conditions including agitation related to early onset Alzheimer dementia, worsening in the past 3 months, hospitalization for agitation 2/12 - 05/28/2020; long stay due to finding LTC placement for patient, per Epic record.  Agitation impairs patient's quality of life; it is worse during patient care as he often refuses.  His psych medications are helpful.   History obtained from review of EMR, discussion with primary team, caregiver, family and/or Nathan Schroeder.  Review and summarization of Epic records shows history from other than patient. Rest of 10 point ROS asked and negative.     Review of lab tests/diagnostics    Results for Nathan Schroeder, Nathan Schroeder (MRN [id number]) as of 06/15/2020 16:52  Ref. Range 05/17/2020 13:17  Sodium Latest Ref Range: 135 - 145 mmol/L 141  Potassium Latest Ref Range: 3.5 - 5.1 mmol/L 4.1  Chloride Latest Ref Range: 98 - 111 mmol/L 105  CO2 Latest Ref Range: 22 - 32 mmol/L 28  Glucose Latest Ref Range: 70 - 99 mg/dL 96  BUN Latest  Ref Range: 8 - 23 mg/dL 16  Creatinine Latest Ref Range: 0.61 - 1.24 mg/dL 0.83  Calcium Latest Ref Range: 8.9 - 10.3 mg/dL 9.1  Anion gap Latest Ref Range: 5 - 15  8  Alkaline  Phosphatase Latest Ref Range: 38 - 126 U/L 60  Albumin Latest Ref Range: 3.5 - 5.0 g/dL 3.2 (L)  AST Latest Ref Range: 15 - 41 U/L 16  ALT Latest Ref Range: 0 - 44 U/L 15  Total Protein Latest Ref Range: 6.5 - 8.1 g/dL 7.0  Total Bilirubin Latest Ref Range: 0.3 - 1.2 mg/dL 0.7  GFR, Estimated Latest Ref Range: >60 mL/min >60  Results for Nathan Schroeder, Nathan Schroeder (MRN [id number]) as of 06/15/2020 16:52  Ref. Range 03/20/2020 11:54  Appearance Latest Ref Range: CLEAR  CLEAR  Bilirubin Urine Latest Ref Range: NEGATIVE  NEGATIVE  Color, Urine Latest Ref Range: YELLOW  AMBER (A)  Glucose, UA Latest Ref Range: NEGATIVE mg/dL NEGATIVE  Hgb urine dipstick Latest Ref Range: NEGATIVE  NEGATIVE  Ketones, ur Latest Ref Range: NEGATIVE mg/dL NEGATIVE  Leukocytes,Ua Latest Ref Range: NEGATIVE  SMALL (A)  Nitrite Latest Ref Range: NEGATIVE  NEGATIVE  pH Latest Ref Range: 5.0 - 8.0  5.0  Protein Latest Ref Range: NEGATIVE mg/dL 30 (A)   ROS* General: NAD EYES: denies vision changes ENMT: denies dysphagia Cardiovascular: denies chest pain/discomfort Pulmonary: denies cough, denies SOB Abdomen: endorses good appetite, denies constipation, endorses continence of bowel GU: denies dysuria, urinary frequency MSK:  denies weakness,  no falls reported Skin: denies rashes or wounds Neurological: denies pain, denies insomnia Psych: Endorses positive mood Heme/lymph/immuno: denies bruises, abnormal bleeding  Physical Exam: Constitutional: NAD General: Well groomed, cooperative EYES: anicteric sclera, lids intact, no discharge  ENMT: Moist mucous membrane CV: S1 S2, RRR, no LE edema Pulmonary: LCTA, no increased work of breathing, no cough, Abdomen: active BS + 4 quadrants, soft and non tender GU: no suprapubic tenderness MSK: weakness, sarcopenia, limited ROM Skin: warm and dry, no rashes or wounds on visible skin Neuro:  weakness, otherwise non focal Psych: agitation, confused Hem/lymph/immuno: no  widespread bruising   PAST MEDICAL HISTORY:  Active Ambulatory Problems    Diagnosis Date Noted  . Hypertension 07/19/2012  . Memory loss 07/19/2012  . Mild cognitive impairment with memory loss 02/03/2013  . Ventral hernia without obstruction or gangrene 04/13/2019  . Tinea pedis of both feet 04/13/2019  . Iron excess 04/13/2019  . Hyperlipidemia LDL goal <100 04/13/2019  . Localized swelling of both lower legs 04/13/2019  . BMI 40.0-44.9, adult ([place]) 04/13/2019  . Frequent urination 04/13/2019  . Alzheimer's disease ([place])   . Aggressive behavior   . Behavioral and psychological symptoms of dementia ([place])   . End of life care 03/23/2020  . Agitation   . Malnutrition of moderate degree 05/01/2020   Resolved Ambulatory Problems    Diagnosis Date Noted  . No Resolved Ambulatory Problems   Past Medical History:  Diagnosis Date  . Anxiety     SOCIAL HX:  Social History   Tobacco Use  . Smoking status: Never Smoker  . Smokeless tobacco: Former Systems developer    Types: Snuff  Substance Use Topics  . Alcohol use: Yes    Alcohol/week: 2.0 standard drinks    Types: 2 Cans of beer per week    Comment: weekend     FAMILY HX:  Family History  Problem Relation Age of Onset  . Dementia Mother  ALLERGIES: No Known Allergies    PERTINENT MEDICATIONS:  Outpatient Encounter Medications as of 06/15/2020  Medication Sig  . divalproex (DEPAKOTE SPRINKLE) 125 MG capsule Take 4 capsules (500 mg total) by mouth every 12 (twelve) hours.  [name] QUEtiapine (SEROQUEL) 100 MG tablet Take 100 mg by mouth 2 (two) times daily.  . sertraline (ZOLOFT) 50 MG tablet Take 1.5 tablets (75 mg total) by mouth daily.   No facility-administered encounter medications on file as of 06/15/2020.     Thank you for the opportunity to participate in the care of Mr. Waterworth.  The palliative care team will continue to follow. Please call our office at [phone] if we can be of additional assistance.   Note:  Portions of this note were generated with Lobbyist. Dictation errors may occur despite best attempts at proofreading.  Teodoro Spray, NP

## 2020-06-27 DIAGNOSIS — R69 Illness, unspecified: Secondary | ICD-10-CM | POA: Diagnosis not present

## 2020-07-03 DIAGNOSIS — G309 Alzheimer's disease, unspecified: Secondary | ICD-10-CM | POA: Diagnosis not present

## 2020-07-03 DIAGNOSIS — R278 Other lack of coordination: Secondary | ICD-10-CM | POA: Diagnosis not present

## 2020-07-05 DIAGNOSIS — G309 Alzheimer's disease, unspecified: Secondary | ICD-10-CM | POA: Diagnosis not present

## 2020-07-05 DIAGNOSIS — R278 Other lack of coordination: Secondary | ICD-10-CM | POA: Diagnosis not present

## 2020-07-06 DIAGNOSIS — G309 Alzheimer's disease, unspecified: Secondary | ICD-10-CM | POA: Diagnosis not present

## 2020-07-06 DIAGNOSIS — R278 Other lack of coordination: Secondary | ICD-10-CM | POA: Diagnosis not present

## 2020-07-09 DIAGNOSIS — G309 Alzheimer's disease, unspecified: Secondary | ICD-10-CM | POA: Diagnosis not present

## 2020-07-09 DIAGNOSIS — R278 Other lack of coordination: Secondary | ICD-10-CM | POA: Diagnosis not present

## 2020-07-10 DIAGNOSIS — R278 Other lack of coordination: Secondary | ICD-10-CM | POA: Diagnosis not present

## 2020-07-10 DIAGNOSIS — G309 Alzheimer's disease, unspecified: Secondary | ICD-10-CM | POA: Diagnosis not present

## 2020-07-11 DIAGNOSIS — R278 Other lack of coordination: Secondary | ICD-10-CM | POA: Diagnosis not present

## 2020-07-11 DIAGNOSIS — G309 Alzheimer's disease, unspecified: Secondary | ICD-10-CM | POA: Diagnosis not present

## 2020-07-12 DIAGNOSIS — R278 Other lack of coordination: Secondary | ICD-10-CM | POA: Diagnosis not present

## 2020-07-12 DIAGNOSIS — G309 Alzheimer's disease, unspecified: Secondary | ICD-10-CM | POA: Diagnosis not present

## 2020-07-13 DIAGNOSIS — R278 Other lack of coordination: Secondary | ICD-10-CM | POA: Diagnosis not present

## 2020-07-13 DIAGNOSIS — G309 Alzheimer's disease, unspecified: Secondary | ICD-10-CM | POA: Diagnosis not present

## 2020-07-16 DIAGNOSIS — G309 Alzheimer's disease, unspecified: Secondary | ICD-10-CM | POA: Diagnosis not present

## 2020-07-16 DIAGNOSIS — R278 Other lack of coordination: Secondary | ICD-10-CM | POA: Diagnosis not present

## 2020-07-17 DIAGNOSIS — G309 Alzheimer's disease, unspecified: Secondary | ICD-10-CM | POA: Diagnosis not present

## 2020-07-17 DIAGNOSIS — R278 Other lack of coordination: Secondary | ICD-10-CM | POA: Diagnosis not present

## 2020-07-18 DIAGNOSIS — G309 Alzheimer's disease, unspecified: Secondary | ICD-10-CM | POA: Diagnosis not present

## 2020-07-18 DIAGNOSIS — R278 Other lack of coordination: Secondary | ICD-10-CM | POA: Diagnosis not present

## 2020-07-19 DIAGNOSIS — R278 Other lack of coordination: Secondary | ICD-10-CM | POA: Diagnosis not present

## 2020-07-19 DIAGNOSIS — G309 Alzheimer's disease, unspecified: Secondary | ICD-10-CM | POA: Diagnosis not present

## 2020-07-20 DIAGNOSIS — G309 Alzheimer's disease, unspecified: Secondary | ICD-10-CM | POA: Diagnosis not present

## 2020-07-20 DIAGNOSIS — R278 Other lack of coordination: Secondary | ICD-10-CM | POA: Diagnosis not present

## 2020-07-21 DIAGNOSIS — R278 Other lack of coordination: Secondary | ICD-10-CM | POA: Diagnosis not present

## 2020-07-21 DIAGNOSIS — G309 Alzheimer's disease, unspecified: Secondary | ICD-10-CM | POA: Diagnosis not present

## 2020-07-23 DIAGNOSIS — G309 Alzheimer's disease, unspecified: Secondary | ICD-10-CM | POA: Diagnosis not present

## 2020-07-23 DIAGNOSIS — R278 Other lack of coordination: Secondary | ICD-10-CM | POA: Diagnosis not present

## 2020-07-24 DIAGNOSIS — G309 Alzheimer's disease, unspecified: Secondary | ICD-10-CM | POA: Diagnosis not present

## 2020-07-24 DIAGNOSIS — R278 Other lack of coordination: Secondary | ICD-10-CM | POA: Diagnosis not present

## 2020-07-25 DIAGNOSIS — G309 Alzheimer's disease, unspecified: Secondary | ICD-10-CM | POA: Diagnosis not present

## 2020-07-25 DIAGNOSIS — R278 Other lack of coordination: Secondary | ICD-10-CM | POA: Diagnosis not present

## 2020-07-26 DIAGNOSIS — R278 Other lack of coordination: Secondary | ICD-10-CM | POA: Diagnosis not present

## 2020-07-26 DIAGNOSIS — G309 Alzheimer's disease, unspecified: Secondary | ICD-10-CM | POA: Diagnosis not present

## 2020-07-27 DIAGNOSIS — G309 Alzheimer's disease, unspecified: Secondary | ICD-10-CM | POA: Diagnosis not present

## 2020-07-27 DIAGNOSIS — R278 Other lack of coordination: Secondary | ICD-10-CM | POA: Diagnosis not present

## 2020-07-30 DIAGNOSIS — G309 Alzheimer's disease, unspecified: Secondary | ICD-10-CM | POA: Diagnosis not present

## 2020-07-30 DIAGNOSIS — R278 Other lack of coordination: Secondary | ICD-10-CM | POA: Diagnosis not present

## 2020-07-31 DIAGNOSIS — G309 Alzheimer's disease, unspecified: Secondary | ICD-10-CM | POA: Diagnosis not present

## 2020-07-31 DIAGNOSIS — R278 Other lack of coordination: Secondary | ICD-10-CM | POA: Diagnosis not present

## 2020-08-01 DIAGNOSIS — G309 Alzheimer's disease, unspecified: Secondary | ICD-10-CM | POA: Diagnosis not present

## 2020-08-01 DIAGNOSIS — R278 Other lack of coordination: Secondary | ICD-10-CM | POA: Diagnosis not present

## 2020-08-02 DIAGNOSIS — R278 Other lack of coordination: Secondary | ICD-10-CM | POA: Diagnosis not present

## 2020-08-02 DIAGNOSIS — G309 Alzheimer's disease, unspecified: Secondary | ICD-10-CM | POA: Diagnosis not present

## 2020-08-03 DIAGNOSIS — G309 Alzheimer's disease, unspecified: Secondary | ICD-10-CM | POA: Diagnosis not present

## 2020-08-03 DIAGNOSIS — R278 Other lack of coordination: Secondary | ICD-10-CM | POA: Diagnosis not present

## 2020-08-06 DIAGNOSIS — L89153 Pressure ulcer of sacral region, stage 3: Secondary | ICD-10-CM | POA: Diagnosis not present

## 2020-08-06 DIAGNOSIS — L89613 Pressure ulcer of right heel, stage 3: Secondary | ICD-10-CM | POA: Diagnosis not present

## 2020-08-06 DIAGNOSIS — L89513 Pressure ulcer of right ankle, stage 3: Secondary | ICD-10-CM | POA: Diagnosis not present

## 2020-08-06 DIAGNOSIS — R278 Other lack of coordination: Secondary | ICD-10-CM | POA: Diagnosis not present

## 2020-08-06 DIAGNOSIS — L89893 Pressure ulcer of other site, stage 3: Secondary | ICD-10-CM | POA: Diagnosis not present

## 2020-08-06 DIAGNOSIS — R69 Illness, unspecified: Secondary | ICD-10-CM | POA: Diagnosis not present

## 2020-08-06 DIAGNOSIS — G309 Alzheimer's disease, unspecified: Secondary | ICD-10-CM | POA: Diagnosis not present

## 2020-08-07 DIAGNOSIS — G309 Alzheimer's disease, unspecified: Secondary | ICD-10-CM | POA: Diagnosis not present

## 2020-08-07 DIAGNOSIS — R278 Other lack of coordination: Secondary | ICD-10-CM | POA: Diagnosis not present

## 2020-08-08 DIAGNOSIS — R278 Other lack of coordination: Secondary | ICD-10-CM | POA: Diagnosis not present

## 2020-08-08 DIAGNOSIS — L8990 Pressure ulcer of unspecified site, unspecified stage: Secondary | ICD-10-CM | POA: Diagnosis not present

## 2020-08-08 DIAGNOSIS — R69 Illness, unspecified: Secondary | ICD-10-CM | POA: Diagnosis not present

## 2020-08-08 DIAGNOSIS — G309 Alzheimer's disease, unspecified: Secondary | ICD-10-CM | POA: Diagnosis not present

## 2020-08-09 DIAGNOSIS — G309 Alzheimer's disease, unspecified: Secondary | ICD-10-CM | POA: Diagnosis not present

## 2020-08-09 DIAGNOSIS — R278 Other lack of coordination: Secondary | ICD-10-CM | POA: Diagnosis not present

## 2020-08-10 DIAGNOSIS — G309 Alzheimer's disease, unspecified: Secondary | ICD-10-CM | POA: Diagnosis not present

## 2020-08-10 DIAGNOSIS — R278 Other lack of coordination: Secondary | ICD-10-CM | POA: Diagnosis not present

## 2020-08-13 DIAGNOSIS — R278 Other lack of coordination: Secondary | ICD-10-CM | POA: Diagnosis not present

## 2020-08-13 DIAGNOSIS — G309 Alzheimer's disease, unspecified: Secondary | ICD-10-CM | POA: Diagnosis not present

## 2020-08-14 DIAGNOSIS — R278 Other lack of coordination: Secondary | ICD-10-CM | POA: Diagnosis not present

## 2020-08-14 DIAGNOSIS — G309 Alzheimer's disease, unspecified: Secondary | ICD-10-CM | POA: Diagnosis not present

## 2020-08-16 DIAGNOSIS — R278 Other lack of coordination: Secondary | ICD-10-CM | POA: Diagnosis not present

## 2020-08-16 DIAGNOSIS — G309 Alzheimer's disease, unspecified: Secondary | ICD-10-CM | POA: Diagnosis not present

## 2020-08-20 DIAGNOSIS — R278 Other lack of coordination: Secondary | ICD-10-CM | POA: Diagnosis not present

## 2020-08-20 DIAGNOSIS — G309 Alzheimer's disease, unspecified: Secondary | ICD-10-CM | POA: Diagnosis not present

## 2020-08-22 DIAGNOSIS — G309 Alzheimer's disease, unspecified: Secondary | ICD-10-CM | POA: Diagnosis not present

## 2020-08-22 DIAGNOSIS — R278 Other lack of coordination: Secondary | ICD-10-CM | POA: Diagnosis not present

## 2020-08-24 DIAGNOSIS — G309 Alzheimer's disease, unspecified: Secondary | ICD-10-CM | POA: Diagnosis not present

## 2020-08-24 DIAGNOSIS — R278 Other lack of coordination: Secondary | ICD-10-CM | POA: Diagnosis not present

## 2020-08-27 DIAGNOSIS — R278 Other lack of coordination: Secondary | ICD-10-CM | POA: Diagnosis not present

## 2020-08-27 DIAGNOSIS — G309 Alzheimer's disease, unspecified: Secondary | ICD-10-CM | POA: Diagnosis not present

## 2020-09-10 DIAGNOSIS — R5381 Other malaise: Secondary | ICD-10-CM | POA: Diagnosis not present

## 2020-09-10 DIAGNOSIS — R531 Weakness: Secondary | ICD-10-CM | POA: Diagnosis not present

## 2020-09-10 DIAGNOSIS — L89510 Pressure ulcer of right ankle, unstageable: Secondary | ICD-10-CM | POA: Diagnosis not present

## 2020-09-10 DIAGNOSIS — Z872 Personal history of diseases of the skin and subcutaneous tissue: Secondary | ICD-10-CM | POA: Diagnosis not present

## 2020-09-10 DIAGNOSIS — L89610 Pressure ulcer of right heel, unstageable: Secondary | ICD-10-CM | POA: Diagnosis not present

## 2020-09-10 DIAGNOSIS — L89890 Pressure ulcer of other site, unstageable: Secondary | ICD-10-CM | POA: Diagnosis not present

## 2020-09-10 DIAGNOSIS — Z7401 Bed confinement status: Secondary | ICD-10-CM | POA: Diagnosis not present

## 2020-10-03 DIAGNOSIS — R69 Illness, unspecified: Secondary | ICD-10-CM | POA: Diagnosis not present

## 2020-10-03 DIAGNOSIS — L8990 Pressure ulcer of unspecified site, unspecified stage: Secondary | ICD-10-CM | POA: Diagnosis not present

## 2020-10-08 DIAGNOSIS — R5381 Other malaise: Secondary | ICD-10-CM | POA: Diagnosis not present

## 2020-10-08 DIAGNOSIS — L89153 Pressure ulcer of sacral region, stage 3: Secondary | ICD-10-CM | POA: Diagnosis not present

## 2020-10-08 DIAGNOSIS — R531 Weakness: Secondary | ICD-10-CM | POA: Diagnosis not present

## 2020-10-08 DIAGNOSIS — Z7401 Bed confinement status: Secondary | ICD-10-CM | POA: Diagnosis not present

## 2020-10-08 DIAGNOSIS — L89613 Pressure ulcer of right heel, stage 3: Secondary | ICD-10-CM | POA: Diagnosis not present

## 2020-10-08 DIAGNOSIS — G309 Alzheimer's disease, unspecified: Secondary | ICD-10-CM | POA: Diagnosis not present

## 2020-10-08 DIAGNOSIS — R410 Disorientation, unspecified: Secondary | ICD-10-CM | POA: Diagnosis not present

## 2020-10-08 DIAGNOSIS — R4182 Altered mental status, unspecified: Secondary | ICD-10-CM | POA: Diagnosis not present

## 2020-10-08 DIAGNOSIS — R69 Illness, unspecified: Secondary | ICD-10-CM | POA: Diagnosis not present

## 2020-10-11 DIAGNOSIS — L97511 Non-pressure chronic ulcer of other part of right foot limited to breakdown of skin: Secondary | ICD-10-CM | POA: Diagnosis not present

## 2020-10-11 DIAGNOSIS — L97521 Non-pressure chronic ulcer of other part of left foot limited to breakdown of skin: Secondary | ICD-10-CM | POA: Diagnosis not present

## 2020-10-11 DIAGNOSIS — I70245 Atherosclerosis of native arteries of left leg with ulceration of other part of foot: Secondary | ICD-10-CM | POA: Diagnosis not present

## 2020-10-11 DIAGNOSIS — I70235 Atherosclerosis of native arteries of right leg with ulceration of other part of foot: Secondary | ICD-10-CM | POA: Diagnosis not present

## 2020-11-08 DIAGNOSIS — M24562 Contracture, left knee: Secondary | ICD-10-CM | POA: Diagnosis not present

## 2020-11-08 DIAGNOSIS — G309 Alzheimer's disease, unspecified: Secondary | ICD-10-CM | POA: Diagnosis not present

## 2020-11-08 DIAGNOSIS — M24561 Contracture, right knee: Secondary | ICD-10-CM | POA: Diagnosis not present

## 2020-11-09 DIAGNOSIS — M24561 Contracture, right knee: Secondary | ICD-10-CM | POA: Diagnosis not present

## 2020-11-09 DIAGNOSIS — M24562 Contracture, left knee: Secondary | ICD-10-CM | POA: Diagnosis not present

## 2020-11-09 DIAGNOSIS — G309 Alzheimer's disease, unspecified: Secondary | ICD-10-CM | POA: Diagnosis not present

## 2020-11-10 DIAGNOSIS — M24561 Contracture, right knee: Secondary | ICD-10-CM | POA: Diagnosis not present

## 2020-11-10 DIAGNOSIS — G309 Alzheimer's disease, unspecified: Secondary | ICD-10-CM | POA: Diagnosis not present

## 2020-11-10 DIAGNOSIS — M24562 Contracture, left knee: Secondary | ICD-10-CM | POA: Diagnosis not present

## 2020-11-12 DIAGNOSIS — L89893 Pressure ulcer of other site, stage 3: Secondary | ICD-10-CM | POA: Diagnosis not present

## 2020-11-12 DIAGNOSIS — L89613 Pressure ulcer of right heel, stage 3: Secondary | ICD-10-CM | POA: Diagnosis not present

## 2020-11-12 DIAGNOSIS — M24561 Contracture, right knee: Secondary | ICD-10-CM | POA: Diagnosis not present

## 2020-11-12 DIAGNOSIS — L89513 Pressure ulcer of right ankle, stage 3: Secondary | ICD-10-CM | POA: Diagnosis not present

## 2020-11-12 DIAGNOSIS — G309 Alzheimer's disease, unspecified: Secondary | ICD-10-CM | POA: Diagnosis not present

## 2020-11-12 DIAGNOSIS — M24562 Contracture, left knee: Secondary | ICD-10-CM | POA: Diagnosis not present

## 2020-11-13 DIAGNOSIS — G309 Alzheimer's disease, unspecified: Secondary | ICD-10-CM | POA: Diagnosis not present

## 2020-11-13 DIAGNOSIS — M24562 Contracture, left knee: Secondary | ICD-10-CM | POA: Diagnosis not present

## 2020-11-13 DIAGNOSIS — M24561 Contracture, right knee: Secondary | ICD-10-CM | POA: Diagnosis not present

## 2020-11-14 DIAGNOSIS — M24562 Contracture, left knee: Secondary | ICD-10-CM | POA: Diagnosis not present

## 2020-11-14 DIAGNOSIS — G309 Alzheimer's disease, unspecified: Secondary | ICD-10-CM | POA: Diagnosis not present

## 2020-11-14 DIAGNOSIS — M24561 Contracture, right knee: Secondary | ICD-10-CM | POA: Diagnosis not present

## 2020-11-15 DIAGNOSIS — G309 Alzheimer's disease, unspecified: Secondary | ICD-10-CM | POA: Diagnosis not present

## 2020-11-15 DIAGNOSIS — M24562 Contracture, left knee: Secondary | ICD-10-CM | POA: Diagnosis not present

## 2020-11-15 DIAGNOSIS — M24561 Contracture, right knee: Secondary | ICD-10-CM | POA: Diagnosis not present

## 2020-11-16 DIAGNOSIS — M24562 Contracture, left knee: Secondary | ICD-10-CM | POA: Diagnosis not present

## 2020-11-16 DIAGNOSIS — G309 Alzheimer's disease, unspecified: Secondary | ICD-10-CM | POA: Diagnosis not present

## 2020-11-16 DIAGNOSIS — M24561 Contracture, right knee: Secondary | ICD-10-CM | POA: Diagnosis not present

## 2020-11-19 DIAGNOSIS — M24562 Contracture, left knee: Secondary | ICD-10-CM | POA: Diagnosis not present

## 2020-11-19 DIAGNOSIS — M24561 Contracture, right knee: Secondary | ICD-10-CM | POA: Diagnosis not present

## 2020-11-19 DIAGNOSIS — G309 Alzheimer's disease, unspecified: Secondary | ICD-10-CM | POA: Diagnosis not present

## 2020-11-20 DIAGNOSIS — G309 Alzheimer's disease, unspecified: Secondary | ICD-10-CM | POA: Diagnosis not present

## 2020-11-20 DIAGNOSIS — M24562 Contracture, left knee: Secondary | ICD-10-CM | POA: Diagnosis not present

## 2020-11-20 DIAGNOSIS — M24561 Contracture, right knee: Secondary | ICD-10-CM | POA: Diagnosis not present

## 2020-11-21 DIAGNOSIS — M24561 Contracture, right knee: Secondary | ICD-10-CM | POA: Diagnosis not present

## 2020-11-21 DIAGNOSIS — G309 Alzheimer's disease, unspecified: Secondary | ICD-10-CM | POA: Diagnosis not present

## 2020-11-21 DIAGNOSIS — M24562 Contracture, left knee: Secondary | ICD-10-CM | POA: Diagnosis not present

## 2020-11-22 DIAGNOSIS — M24562 Contracture, left knee: Secondary | ICD-10-CM | POA: Diagnosis not present

## 2020-11-22 DIAGNOSIS — M24561 Contracture, right knee: Secondary | ICD-10-CM | POA: Diagnosis not present

## 2020-11-22 DIAGNOSIS — G309 Alzheimer's disease, unspecified: Secondary | ICD-10-CM | POA: Diagnosis not present

## 2020-11-23 DIAGNOSIS — G309 Alzheimer's disease, unspecified: Secondary | ICD-10-CM | POA: Diagnosis not present

## 2020-11-23 DIAGNOSIS — M24562 Contracture, left knee: Secondary | ICD-10-CM | POA: Diagnosis not present

## 2020-11-23 DIAGNOSIS — M24561 Contracture, right knee: Secondary | ICD-10-CM | POA: Diagnosis not present

## 2020-11-24 DIAGNOSIS — M24561 Contracture, right knee: Secondary | ICD-10-CM | POA: Diagnosis not present

## 2020-11-24 DIAGNOSIS — M24562 Contracture, left knee: Secondary | ICD-10-CM | POA: Diagnosis not present

## 2020-11-24 DIAGNOSIS — G309 Alzheimer's disease, unspecified: Secondary | ICD-10-CM | POA: Diagnosis not present

## 2020-11-26 DIAGNOSIS — M24562 Contracture, left knee: Secondary | ICD-10-CM | POA: Diagnosis not present

## 2020-11-26 DIAGNOSIS — G309 Alzheimer's disease, unspecified: Secondary | ICD-10-CM | POA: Diagnosis not present

## 2020-11-26 DIAGNOSIS — M24561 Contracture, right knee: Secondary | ICD-10-CM | POA: Diagnosis not present

## 2020-11-27 DIAGNOSIS — G309 Alzheimer's disease, unspecified: Secondary | ICD-10-CM | POA: Diagnosis not present

## 2020-11-27 DIAGNOSIS — M24562 Contracture, left knee: Secondary | ICD-10-CM | POA: Diagnosis not present

## 2020-11-27 DIAGNOSIS — M24561 Contracture, right knee: Secondary | ICD-10-CM | POA: Diagnosis not present

## 2020-11-28 DIAGNOSIS — M24561 Contracture, right knee: Secondary | ICD-10-CM | POA: Diagnosis not present

## 2020-11-28 DIAGNOSIS — M24562 Contracture, left knee: Secondary | ICD-10-CM | POA: Diagnosis not present

## 2020-11-28 DIAGNOSIS — G309 Alzheimer's disease, unspecified: Secondary | ICD-10-CM | POA: Diagnosis not present

## 2020-11-28 DIAGNOSIS — L8990 Pressure ulcer of unspecified site, unspecified stage: Secondary | ICD-10-CM | POA: Diagnosis not present

## 2020-11-28 DIAGNOSIS — R69 Illness, unspecified: Secondary | ICD-10-CM | POA: Diagnosis not present

## 2020-11-29 DIAGNOSIS — G309 Alzheimer's disease, unspecified: Secondary | ICD-10-CM | POA: Diagnosis not present

## 2020-11-29 DIAGNOSIS — M24562 Contracture, left knee: Secondary | ICD-10-CM | POA: Diagnosis not present

## 2020-11-29 DIAGNOSIS — M24561 Contracture, right knee: Secondary | ICD-10-CM | POA: Diagnosis not present

## 2020-11-30 DIAGNOSIS — M24562 Contracture, left knee: Secondary | ICD-10-CM | POA: Diagnosis not present

## 2020-11-30 DIAGNOSIS — M24561 Contracture, right knee: Secondary | ICD-10-CM | POA: Diagnosis not present

## 2020-11-30 DIAGNOSIS — G309 Alzheimer's disease, unspecified: Secondary | ICD-10-CM | POA: Diagnosis not present

## 2020-12-03 DIAGNOSIS — G309 Alzheimer's disease, unspecified: Secondary | ICD-10-CM | POA: Diagnosis not present

## 2020-12-03 DIAGNOSIS — M24561 Contracture, right knee: Secondary | ICD-10-CM | POA: Diagnosis not present

## 2020-12-03 DIAGNOSIS — M24562 Contracture, left knee: Secondary | ICD-10-CM | POA: Diagnosis not present

## 2020-12-04 DIAGNOSIS — M24561 Contracture, right knee: Secondary | ICD-10-CM | POA: Diagnosis not present

## 2020-12-04 DIAGNOSIS — G309 Alzheimer's disease, unspecified: Secondary | ICD-10-CM | POA: Diagnosis not present

## 2020-12-04 DIAGNOSIS — M24562 Contracture, left knee: Secondary | ICD-10-CM | POA: Diagnosis not present

## 2020-12-05 DIAGNOSIS — M24562 Contracture, left knee: Secondary | ICD-10-CM | POA: Diagnosis not present

## 2020-12-05 DIAGNOSIS — G309 Alzheimer's disease, unspecified: Secondary | ICD-10-CM | POA: Diagnosis not present

## 2020-12-05 DIAGNOSIS — M24561 Contracture, right knee: Secondary | ICD-10-CM | POA: Diagnosis not present

## 2020-12-06 DIAGNOSIS — G309 Alzheimer's disease, unspecified: Secondary | ICD-10-CM | POA: Diagnosis not present

## 2020-12-06 DIAGNOSIS — M24561 Contracture, right knee: Secondary | ICD-10-CM | POA: Diagnosis not present

## 2020-12-06 DIAGNOSIS — M24562 Contracture, left knee: Secondary | ICD-10-CM | POA: Diagnosis not present

## 2020-12-07 DIAGNOSIS — M24561 Contracture, right knee: Secondary | ICD-10-CM | POA: Diagnosis not present

## 2020-12-07 DIAGNOSIS — M24562 Contracture, left knee: Secondary | ICD-10-CM | POA: Diagnosis not present

## 2020-12-07 DIAGNOSIS — G309 Alzheimer's disease, unspecified: Secondary | ICD-10-CM | POA: Diagnosis not present

## 2020-12-08 DIAGNOSIS — M24561 Contracture, right knee: Secondary | ICD-10-CM | POA: Diagnosis not present

## 2020-12-08 DIAGNOSIS — G309 Alzheimer's disease, unspecified: Secondary | ICD-10-CM | POA: Diagnosis not present

## 2020-12-08 DIAGNOSIS — M24562 Contracture, left knee: Secondary | ICD-10-CM | POA: Diagnosis not present

## 2020-12-10 DIAGNOSIS — M24562 Contracture, left knee: Secondary | ICD-10-CM | POA: Diagnosis not present

## 2020-12-10 DIAGNOSIS — M24561 Contracture, right knee: Secondary | ICD-10-CM | POA: Diagnosis not present

## 2020-12-10 DIAGNOSIS — L03115 Cellulitis of right lower limb: Secondary | ICD-10-CM | POA: Diagnosis not present

## 2020-12-10 DIAGNOSIS — G309 Alzheimer's disease, unspecified: Secondary | ICD-10-CM | POA: Diagnosis not present

## 2020-12-10 DIAGNOSIS — L89893 Pressure ulcer of other site, stage 3: Secondary | ICD-10-CM | POA: Diagnosis not present

## 2020-12-10 DIAGNOSIS — L89613 Pressure ulcer of right heel, stage 3: Secondary | ICD-10-CM | POA: Diagnosis not present

## 2020-12-10 DIAGNOSIS — L89153 Pressure ulcer of sacral region, stage 3: Secondary | ICD-10-CM | POA: Diagnosis not present

## 2020-12-11 DIAGNOSIS — M24562 Contracture, left knee: Secondary | ICD-10-CM | POA: Diagnosis not present

## 2020-12-11 DIAGNOSIS — M24561 Contracture, right knee: Secondary | ICD-10-CM | POA: Diagnosis not present

## 2020-12-11 DIAGNOSIS — G309 Alzheimer's disease, unspecified: Secondary | ICD-10-CM | POA: Diagnosis not present

## 2020-12-12 DIAGNOSIS — M24561 Contracture, right knee: Secondary | ICD-10-CM | POA: Diagnosis not present

## 2020-12-12 DIAGNOSIS — M24562 Contracture, left knee: Secondary | ICD-10-CM | POA: Diagnosis not present

## 2020-12-12 DIAGNOSIS — G309 Alzheimer's disease, unspecified: Secondary | ICD-10-CM | POA: Diagnosis not present

## 2020-12-25 ENCOUNTER — Other Ambulatory Visit: Payer: Self-pay

## 2020-12-25 ENCOUNTER — Non-Acute Institutional Stay: Payer: 59 | Admitting: Hospice

## 2020-12-25 DIAGNOSIS — R69 Illness, unspecified: Secondary | ICD-10-CM | POA: Diagnosis not present

## 2020-12-25 DIAGNOSIS — R451 Restlessness and agitation: Secondary | ICD-10-CM

## 2020-12-25 DIAGNOSIS — Z515 Encounter for palliative care: Secondary | ICD-10-CM

## 2020-12-25 DIAGNOSIS — F03918 Unspecified dementia, unspecified severity, with other behavioral disturbance: Secondary | ICD-10-CM

## 2020-12-25 NOTE — Progress Notes (Signed)
[facility] Consult Note Telephone: [phone]  Fax: [phone]  PATIENT NAME: Nathan Schroeder [place] [address] [phone] (home)  DOB: 1960-02-25 MRN: [id number]  PRIMARY CARE PROVIDER:    Dr. Seward Carol  REFERRING PROVIDER:   Dr. Seward Carol RESPONSIBLE PARTY:   Self/spouse Contact Information     Name Relation Home Work Mobile   Maiden F Spouse [phone]     Nameer, Summer   [phone]      I met face to face with patient at facility. Palliative Care was asked to follow this patient by consultation request of  Dr. Seward Carol to address advance care planning, complex medical decision making and goals of care clarification. This is a follow up visit.      ASSESSMENT AND / RECOMMENDATIONS:  CODE STATUS: Patient is a DO NOT RESUSCITATE  Goals of Care: Goals include to maximize quality of life and symptom management  Symptom Management/Plan: Dementia: Ongoing memory loss and confusion.  FLACC 0.  FAST 6D.  Followed by psych.  Continue ongoing supportive care. Agitation: Related to early onset Alzheimer dementia.  Continue Depakote sprinkle, quetiapine.  Utilize de-escalation techniques, distraction with patient. Routine CBC BMP, Depakote level. Depression: Continue Zoloft.  Encourage participation in facility activities. Follow up: Palliative care will continue to follow for complex medical decision making, advance care planning, and clarification of goals. Return 6 weeks or prn.Encouraged to call provider sooner with any concerns.   Family /Caregiver/Community Supports: Patient lives in SNF for ongoing care  PPS: 40%  HOSPICE ELIGIBILITY/DIAGNOSIS: TBD  Chief Complaint: Follow-up visit   HISTORY OF PRESENT ILLNESS:  Nathan Schroeder is a 60 y.o. year old male  with multiple medical conditions including agitation related to early onset Alzheimer dementia.  History of  depression, hypertension.  Patient denies pain/discomfort; nursing reports patient is more cooperative and compliant than before; no concern at this time.  History obtained from review of EMR, discussion with primary team, caregiver, family and/or Nathan Schroeder.  Review and summarization of Epic records shows history from other than patient. Rest of 10 point ROS asked and negative.   Physical Exam: Constitutional: NAD General: Well groomed, cooperative EYES: anicteric sclera, lids intact, no discharge  ENMT: Moist mucous membrane CV: S1 S2, RRR, no LE edema Pulmonary: LCTA, no increased work of breathing, no cough, Abdomen: active BS + 4 quadrants, soft and non tender GU: no suprapubic tenderness MSK: weakness, sarcopenia, limited ROM Skin: warm and dry, no rashes or wounds on visible skin Neuro:  weakness, otherwise non focal Psych: coping Hem/lymph/immuno: no widespread bruising   PAST MEDICAL HISTORY:  Active Ambulatory Problems    Diagnosis Date Noted   Hypertension 07/19/2012   Memory loss 07/19/2012   Mild cognitive impairment with memory loss 02/03/2013   Ventral hernia without obstruction or gangrene 04/13/2019   Tinea pedis of both feet 04/13/2019   Iron excess 04/13/2019   Hyperlipidemia LDL goal <100 04/13/2019   Localized swelling of both lower legs 04/13/2019   BMI 40.0-44.9, adult ([place]) 04/13/2019   Frequent urination 04/13/2019   Alzheimer's disease ([place])    Aggressive behavior    Behavioral and psychological symptoms of dementia    End of life care 03/23/2020   Agitation    Malnutrition of moderate degree 05/01/2020   Resolved Ambulatory Problems    Diagnosis Date Noted   No Resolved Ambulatory Problems   Past Medical History:  Diagnosis Date  Anxiety     SOCIAL HX:  Social History   Tobacco Use   Smoking status: Never   Smokeless tobacco: Former    Types: Snuff  Substance Use Topics   Alcohol use: Yes    Alcohol/week: 2.0 standard drinks     Types: 2 Cans of beer per week    Comment: weekend     FAMILY HX:  Family History  Problem Relation Age of Onset   Dementia Mother       ALLERGIES: No Known Allergies    PERTINENT MEDICATIONS:  Outpatient Encounter Medications as of 12/25/2020  Medication Sig   divalproex (DEPAKOTE SPRINKLE) 125 MG capsule Take 4 capsules (500 mg total) by mouth every 12 (twelve) hours.   QUEtiapine (SEROQUEL) 100 MG tablet Take 100 mg by mouth 2 (two) times daily.   sertraline (ZOLOFT) 50 MG tablet Take 1.5 tablets (75 mg total) by mouth daily.   No facility-administered encounter medications on file as of 12/25/2020.   I spent 35 minutes providing this consultation; time includes spent with patient/family, chart review and documentation. More than 50% of the time in this consultation was spent on coordinating communication   Thank you for the opportunity to participate in the care of Nathan Schroeder.  The palliative care team will continue to follow. Please call our office at [phone] if we can be of additional assistance.   Note: Portions of this note were generated with Lobbyist. Dictation errors may occur despite best attempts at proofreading.  Teodoro Spray, NP

## 2021-01-24 ENCOUNTER — Inpatient Hospital Stay (HOSPITAL_COMMUNITY)
Admission: EM | Admit: 2021-01-24 | Discharge: 2021-01-27 | DRG: 951 | Disposition: E | Payer: 59 | Source: Skilled Nursing Facility | Attending: Internal Medicine | Admitting: Internal Medicine

## 2021-01-24 ENCOUNTER — Other Ambulatory Visit: Payer: Self-pay

## 2021-01-24 ENCOUNTER — Emergency Department (HOSPITAL_COMMUNITY): Payer: 59

## 2021-01-24 DIAGNOSIS — E663 Overweight: Secondary | ICD-10-CM | POA: Diagnosis present

## 2021-01-24 DIAGNOSIS — G300 Alzheimer's disease with early onset: Secondary | ICD-10-CM | POA: Diagnosis not present

## 2021-01-24 DIAGNOSIS — F0280 Dementia in other diseases classified elsewhere without behavioral disturbance: Secondary | ICD-10-CM | POA: Diagnosis present

## 2021-01-24 DIAGNOSIS — I10 Essential (primary) hypertension: Secondary | ICD-10-CM | POA: Diagnosis present

## 2021-01-24 DIAGNOSIS — J9601 Acute respiratory failure with hypoxia: Secondary | ICD-10-CM | POA: Diagnosis present

## 2021-01-24 DIAGNOSIS — F32A Depression, unspecified: Secondary | ICD-10-CM | POA: Diagnosis present

## 2021-01-24 DIAGNOSIS — Z87891 Personal history of nicotine dependence: Secondary | ICD-10-CM | POA: Diagnosis not present

## 2021-01-24 DIAGNOSIS — R6521 Severe sepsis with septic shock: Secondary | ICD-10-CM | POA: Diagnosis present

## 2021-01-24 DIAGNOSIS — R54 Age-related physical debility: Secondary | ICD-10-CM | POA: Diagnosis present

## 2021-01-24 DIAGNOSIS — J690 Pneumonitis due to inhalation of food and vomit: Secondary | ICD-10-CM | POA: Diagnosis not present

## 2021-01-24 DIAGNOSIS — R000 Tachycardia, unspecified: Secondary | ICD-10-CM | POA: Diagnosis not present

## 2021-01-24 DIAGNOSIS — J80 Acute respiratory distress syndrome: Secondary | ICD-10-CM | POA: Diagnosis not present

## 2021-01-24 DIAGNOSIS — J189 Pneumonia, unspecified organism: Secondary | ICD-10-CM | POA: Diagnosis present

## 2021-01-24 DIAGNOSIS — R0902 Hypoxemia: Secondary | ICD-10-CM | POA: Diagnosis not present

## 2021-01-24 DIAGNOSIS — A419 Sepsis, unspecified organism: Secondary | ICD-10-CM | POA: Diagnosis not present

## 2021-01-24 DIAGNOSIS — Z515 Encounter for palliative care: Secondary | ICD-10-CM | POA: Diagnosis not present

## 2021-01-24 DIAGNOSIS — Z6829 Body mass index (BMI) 29.0-29.9, adult: Secondary | ICD-10-CM | POA: Diagnosis not present

## 2021-01-24 DIAGNOSIS — R0689 Other abnormalities of breathing: Secondary | ICD-10-CM | POA: Diagnosis not present

## 2021-01-24 DIAGNOSIS — Z7189 Other specified counseling: Secondary | ICD-10-CM | POA: Diagnosis not present

## 2021-01-24 DIAGNOSIS — Z66 Do not resuscitate: Secondary | ICD-10-CM | POA: Diagnosis not present

## 2021-01-24 DIAGNOSIS — Z20822 Contact with and (suspected) exposure to covid-19: Secondary | ICD-10-CM | POA: Diagnosis present

## 2021-01-24 DIAGNOSIS — R69 Illness, unspecified: Secondary | ICD-10-CM | POA: Diagnosis not present

## 2021-01-24 DIAGNOSIS — R4182 Altered mental status, unspecified: Secondary | ICD-10-CM | POA: Diagnosis not present

## 2021-01-24 LAB — I-STAT CHEM 8, ED
BUN: 36 mg/dL — ABNORMAL HIGH (ref 8–23)
Calcium, Ion: 1.16 mmol/L (ref 1.15–1.40)
Chloride: 112 mmol/L — ABNORMAL HIGH (ref 98–111)
Creatinine, Ser: 1 mg/dL (ref 0.61–1.24)
Glucose, Bld: 98 mg/dL (ref 70–99)
HCT: 44 % (ref 39.0–52.0)
Hemoglobin: 15 g/dL (ref 13.0–17.0)
Potassium: 3.4 mmol/L — ABNORMAL LOW (ref 3.5–5.1)
Sodium: 150 mmol/L — ABNORMAL HIGH (ref 135–145)
TCO2: 27 mmol/L (ref 22–32)

## 2021-01-24 LAB — CBC WITH DIFFERENTIAL/PLATELET
Abs Immature Granulocytes: 0.01 10*3/uL (ref 0.00–0.07)
Basophils Absolute: 0.1 10*3/uL (ref 0.0–0.1)
Basophils Relative: 1 %
Eosinophils Absolute: 0 10*3/uL (ref 0.0–0.5)
Eosinophils Relative: 0 %
HCT: 48.2 % (ref 39.0–52.0)
Hemoglobin: 14.8 g/dL (ref 13.0–17.0)
Immature Granulocytes: 0 %
Lymphocytes Relative: 39 %
Lymphs Abs: 2.2 10*3/uL (ref 0.7–4.0)
MCH: 31.6 pg (ref 26.0–34.0)
MCHC: 30.7 g/dL (ref 30.0–36.0)
MCV: 102.8 fL — ABNORMAL HIGH (ref 80.0–100.0)
Monocytes Absolute: 0.1 10*3/uL (ref 0.1–1.0)
Monocytes Relative: 2 %
Neutro Abs: 3.3 10*3/uL (ref 1.7–7.7)
Neutrophils Relative %: 58 %
Platelets: 248 10*3/uL (ref 150–400)
RBC: 4.69 MIL/uL (ref 4.22–5.81)
RDW: 15.3 % (ref 11.5–15.5)
WBC: 5.7 10*3/uL (ref 4.0–10.5)
nRBC: 0 % (ref 0.0–0.2)

## 2021-01-24 LAB — I-STAT VENOUS BLOOD GAS, ED
Acid-base deficit: 4 mmol/L — ABNORMAL HIGH (ref 0.0–2.0)
Bicarbonate: 25 mmol/L (ref 20.0–28.0)
Calcium, Ion: 1.13 mmol/L — ABNORMAL LOW (ref 1.15–1.40)
HCT: 45 % (ref 39.0–52.0)
Hemoglobin: 15.3 g/dL (ref 13.0–17.0)
O2 Saturation: 37 %
Potassium: 3.7 mmol/L (ref 3.5–5.1)
Sodium: 148 mmol/L — ABNORMAL HIGH (ref 135–145)
TCO2: 27 mmol/L (ref 22–32)
pCO2, Ven: 58.8 mmHg (ref 44.0–60.0)
pH, Ven: 7.237 — ABNORMAL LOW (ref 7.250–7.430)
pO2, Ven: 26 mmHg — CL (ref 32.0–45.0)

## 2021-01-24 LAB — RESP PANEL BY RT-PCR (FLU A&B, COVID) ARPGX2
Influenza A by PCR: NEGATIVE
Influenza B by PCR: NEGATIVE
SARS Coronavirus 2 by RT PCR: NEGATIVE

## 2021-01-24 LAB — LACTIC ACID, PLASMA: Lactic Acid, Venous: 6.8 mmol/L (ref 0.5–1.9)

## 2021-01-24 MED ORDER — LORAZEPAM 2 MG/ML PO CONC: 1.0000 mg | ORAL | Status: DC | PRN

## 2021-01-24 MED ORDER — VANCOMYCIN HCL IN DEXTROSE 1-5 GM/200ML-% IV SOLN
1000.0000 mg | Freq: Once | INTRAVENOUS | Status: AC
  Administered 2021-01-24: 17:00:00 1000 mg via INTRAVENOUS
  Filled 2021-01-24: qty 200

## 2021-01-24 MED ORDER — LORAZEPAM 1 MG PO TABS: 1.0000 mg | ORAL_TABLET | ORAL | Status: DC | PRN

## 2021-01-24 MED ORDER — ONDANSETRON HCL 4 MG/2ML IJ SOLN: 4.0000 mg | Freq: Four times a day (QID) | INTRAMUSCULAR | Status: DC | PRN

## 2021-01-24 MED ORDER — ONDANSETRON 4 MG PO TBDP: 4.0000 mg | ORAL_TABLET | Freq: Four times a day (QID) | ORAL | Status: DC | PRN

## 2021-01-24 MED ORDER — LORAZEPAM 2 MG/ML IJ SOLN: 1.0000 mg | INTRAMUSCULAR | Status: DC | PRN

## 2021-01-24 MED ORDER — SODIUM CHLORIDE 0.9 % IV SOLN
2.0000 g | Freq: Once | INTRAVENOUS | Status: AC
  Administered 2021-01-24: 17:00:00 2 g via INTRAVENOUS
  Filled 2021-01-24: qty 2

## 2021-01-24 MED ORDER — MORPHINE 100MG IN NS 100ML (1MG/ML) PREMIX INFUSION
5.0000 mg/h | INTRAVENOUS | Status: DC
  Administered 2021-01-24: 17:00:00 5 mg/h via INTRAVENOUS
  Filled 2021-01-24: qty 100

## 2021-01-24 NOTE — H&P (Signed)
History and Physical    Nathan Schroeder KXF:[id number] DOB: December 25, 1960 DOA: 01/23/2021  PCP: Pcp, No (Confirm with patient/family/NH records and if not entered, this has to be entered at [facility] point of entry) Patient coming from: Nursing home  I have personally briefly reviewed patient's old medical records in [facility]  Chief Complaint: Patient in comatose  HPI: Nathan Schroeder is a 60 y.o. male with medical history significant of early onset of Alzheimer dementia, HTN, anxiety/depression was brought in from nursing home for evaluation of altered mentation and hypoxia.  Per EMS report, patient was sitting in the recliner and appeared that patient might have aspiration event and became unresponsive.  Pulse ox showed O2 saturation 50%, and nursing home staff started bag-valve-mask ventilation with improvement to lower 90s.  ED Course: Patient obtunded, tachycardia tachypneic, profound hypoxia, was placed on nonrebreather O2 saturation varies in the 80s.  Chest x-ray showed bilateral multifocal pneumonia, ABG showed profound hypoxia compatible with ARDS.  Blood work: Lactic acid > 6.  Patient was given vancomycin and cefepime in the ED.  Family arrived and all agreed with hospice care.  Review of Systems: Unable to perform, patient in comatose  Past Medical History:  Diagnosis Date   Alzheimer's disease (HCC)    Anxiety    Hypertension     Past Surgical History:  Procedure Laterality Date   AMPUTATION Right 1980   PROSTATE SURGERY     [facility]     reports that he has never smoked. He has quit using smokeless tobacco.  His smokeless tobacco use included snuff. He reports current alcohol use of about 2.0 standard drinks per week. No history on file for drug use.  No Known Allergies  Family History  Problem Relation Age of Onset   Dementia Mother      Prior to Admission medications   Medication Sig Start Date End Date Taking? Authorizing Provider   Amino Acids-Protein Hydrolys (PRO-STAT) LIQD Take 30 mLs by mouth 3 (three) times daily.   Yes [provider]  divalproex (DEPAKOTE SPRINKLE) 125 MG capsule Take 4 capsules (500 mg total) by mouth every 12 (twelve) hours. 05/28/20  Yes Karsten Ro, MD  mirtazapine (REMERON) 15 MG tablet Take 15 mg by mouth at bedtime.   Yes [provider]  Multiple Vitamin (MULTIVITAMIN WITH MINERALS) TABS tablet Take 1 tablet by mouth daily.   Yes [provider]  Nutritional Supplements (RESOURCE 2.0) LIQD Take 120 mLs by mouth daily.   Yes [provider]  QUEtiapine (SEROQUEL) 100 MG tablet Take 75 mg by mouth 2 (two) times daily.   Yes [provider]  sertraline (ZOLOFT) 50 MG tablet Take 1.5 tablets (75 mg total) by mouth daily. 05/28/20  Yes Karsten Ro, MD    Physical Exam: Vitals:   01/10/2021 1609 01/18/2021 1621 01/05/2021 1629 01/07/2021 1729  BP:  (!) 131/105  (!) 70/48  Pulse:  (!) 122  (!) 45  Resp:  (!) 39  (!) 44  Temp:   (!) 97.4 F (36.3 C)   TempSrc:   Axillary   SpO2: (!) 89% (!) 70%  (!) 67%    Constitutional: NAD, calm, comfortable Vitals:   01/05/2021 1609 01/14/2021 1621 01/08/2021 1629 12/30/2020 1729  BP:  (!) 131/105  (!) 70/48  Pulse:  (!) 122  (!) 45  Resp:  (!) 39  (!) 44  Temp:   (!) 97.4 F (36.3 C)   TempSrc:   Axillary  SpO2: (!) 89% (!) 70%  (!) 67%   Eyes: PERRL, lids and conjunctivae normal, mucous membrane cyanotic ENMT: Mucous membranes are moist. Posterior pharynx clear of any exudate or lesions.Normal dentition.  Neck: normal, supple, no masses, no thyromegaly Respiratory: Diminished breathing sound bilaterally, diffused crackles bilaterally, increasing breathing effort, positive signs of accessory muscle use Cardiovascular: Regular rate and rhythm, no murmurs / rubs / gallops. No extremity edema. 2+ pedal pulses. No carotid bruits.  Abdomen: no tenderness, no masses palpated. No hepatosplenomegaly. Bowel sounds  positive.  Musculoskeletal: no clubbing / cyanosis. No joint deformity upper and lower extremities. Good ROM, no contractures. Normal muscle tone.  Skin: no rashes, lesions, ulcers. No induration Neurologic: Unresponsive, not moving limbs Psychiatric: Unresponsive and comatose    Labs on Admission: I have personally reviewed following labs and imaging studies  CBC: Recent Labs  Lab Feb 07, 2021 1605 07-Feb-2021 1619 2021/02/07 1818  WBC 5.7  --   --   NEUTROABS 3.3  --   --   HGB 14.8 15.3 15.0  HCT 48.2 45.0 44.0  MCV 102.8*  --   --   PLT 248  --   --    Basic Metabolic Panel: Recent Labs  Lab 02-07-21 1619 02-07-2021 1818  NA 148* 150*  K 3.7 3.4*  CL  --  112*  GLUCOSE  --  98  BUN  --  36*  CREATININE  --  1.00   GFR: CrCl cannot be calculated (Unknown ideal weight.). Liver Function Tests: No results for input(s): AST, ALT, ALKPHOS, BILITOT, PROT, ALBUMIN in the last 168 hours. No results for input(s): LIPASE, AMYLASE in the last 168 hours. No results for input(s): AMMONIA in the last 168 hours. Coagulation Profile: No results for input(s): INR, PROTIME in the last 168 hours. Cardiac Enzymes: No results for input(s): CKTOTAL, CKMB, CKMBINDEX, TROPONINI in the last 168 hours. BNP (last 3 results) No results for input(s): PROBNP in the last 8760 hours. HbA1C: No results for input(s): HGBA1C in the last 72 hours. CBG: No results for input(s): GLUCAP in the last 168 hours. Lipid Profile: No results for input(s): CHOL, HDL, LDLCALC, TRIG, CHOLHDL, LDLDIRECT in the last 72 hours. Thyroid Function Tests: No results for input(s): TSH, T4TOTAL, FREET4, T3FREE, THYROIDAB in the last 72 hours. Anemia Panel: No results for input(s): VITAMINB12, FOLATE, FERRITIN, TIBC, IRON, RETICCTPCT in the last 72 hours. Urine analysis:    Component Value Date/Time   COLORURINE AMBER (A) 03/20/2020 1154   APPEARANCEUR CLEAR 03/20/2020 1154   APPEARANCEUR Clear 12/22/2017 1511   LABSPEC  1.029 03/20/2020 1154   PHURINE 5.0 03/20/2020 1154   GLUCOSEU NEGATIVE 03/20/2020 1154   HGBUR NEGATIVE 03/20/2020 1154   BILIRUBINUR NEGATIVE 03/20/2020 1154   BILIRUBINUR negative 04/13/2019 1027   BILIRUBINUR Negative 12/22/2017 1511   KETONESUR NEGATIVE 03/20/2020 1154   PROTEINUR 30 (A) 03/20/2020 1154   UROBILINOGEN 0.2 04/13/2019 1027   NITRITE NEGATIVE 03/20/2020 1154   LEUKOCYTESUR SMALL (A) 03/20/2020 1154    Radiological Exams on Admission: DG Chest Port 1 View  Result Date: 02-07-2021 CLINICAL DATA:  Hypoxia.  Altered mental status. EXAM: PORTABLE CHEST 1 VIEW COMPARISON:  Radiographs 03/10/2020.  No other comparison studies. FINDINGS: 1620 hours. The heart size and mediastinal contours are grossly stable, without obvious adenopathy. There are new somewhat nodular airspace opacities throughout both lungs. No pneumothorax or significant pleural effusion is identified. The bones appear unchanged status post lower cervical fusion. IMPRESSION: New nodular airspace opacities in both lungs,  most consistent with multilobar pneumonia. Given the nodular appearance, additional considerations would include septic emboli and metastatic disease. Followup PA and lateral chest X-ray is recommended in 3-4 weeks following appropriate therapy to ensure resolution and exclude underlying malignancy. Electronically Signed   By: Carey Bullocks M.D.   On: February 07, 2021 16:27    EKG: Independently reviewed.  Sinus tachycardia, chronic Q wave on 3 aVF.  Assessment/Plan Principal Problem:   ARDS (adult respiratory distress syndrome) (HCC) Active Problems:   Acute respiratory distress syndrome (ARDS) due to COVID-19 virus (HCC)  (please populate well all problems here in Problem List. (For example, if patient is on BP meds at home and you resume or decide to hold them, it is a problem that needs to be her. Same for CAD, COPD, HLD and so on)  ARDS -Diagnosed by patient's x-ray, clinical presentation and  ABG -Patient is DNR, which is confirmed with patient's family including wife, sons and daughter, prognosis grave, expect to expire in few hours.  Comfort measure started in the ED, currently is on morphine drip, will add as needed Ativan.  Explained the pathology and expected clinical progress to patient's family at bedside, all questions answered with my best knowledge.  Severe sepsis -Secondary to likely aspiration pneumonia.  Discontinue antibiotics, comfort care only.  Acute hypoxic respiratory -To aspiration then ARDS.  Comfort measures only  HTN/anxiety -Comfort measure  DVT prophylaxis: No chemical DVT prophylaxis Code Status: DNR Family Communication: Wife, 2 sons and daughter. Disposition Plan: Inpatient hospice, expect patient will pass away in few hours. Consults called: Palliative care Admission status: MedSurg> comfort care   Emeline General MD [facility] Pager [phone]  2021/02/07, 6:46 PM

## 2021-01-24 NOTE — ED Triage Notes (Signed)
Pt BIB [facility] from [facility]. SOB worsening today, difficult to arouse to pain. No traumatic injury. 50's-60's on RA. Brought up to 70's-80's on NRB with EMS. DNR. Possible aspiration overnight after eating.

## 2021-01-24 NOTE — ED Provider Notes (Addendum)
[facility] EMERGENCY DEPARTMENT Provider Note   CSN: [id number] Arrival date & time: 01/16/2021  1554     History Chief Complaint  Patient presents with   Altered Mental Status    Nathan Schroeder is a 60 y.o. male.  Patient presents to the emergency department for altered mental status.  Patient has history of end-stage Alzheimer's dementia, diagnosed at age 32 per wife.  Level 5 caveat due to altered mental status, acuity of condition.  Per EMS report, patient was sitting in recliner at his facility.  He suddenly went unresponsive.  Patient was found with low oxygen saturations, around 50%, with gurgling respirations.  Respirations supported with bag-valve-mask with some improvement, as high as 90%.  Patient was slightly more lethargic than usual last night per the wife who saw him yesterday.  Otherwise no acute medical concerns noted.      Past Medical History:  Diagnosis Date   Alzheimer's disease ([place])    Anxiety    Hypertension     Patient Active Problem List   Diagnosis Date Noted   Malnutrition of moderate degree 05/01/2020   Agitation    End of life care 03/23/2020   Alzheimer's disease ([place])    Aggressive behavior    Behavioral and psychological symptoms of dementia    Ventral hernia without obstruction or gangrene 04/13/2019   Tinea pedis of both feet 04/13/2019   Iron excess 04/13/2019   Hyperlipidemia LDL goal <100 04/13/2019   Localized swelling of both lower legs 04/13/2019   BMI 40.0-44.9, adult ([place]) 04/13/2019   Frequent urination 04/13/2019   Mild cognitive impairment with memory loss 02/03/2013   Hypertension 07/19/2012   Memory loss 07/19/2012    Past Surgical History:  Procedure Laterality Date   AMPUTATION Right [facility]       Family History  Problem Relation Age of Onset   Dementia Mother     Social History   Tobacco Use   Smoking status: Never   Smokeless  tobacco: Former    Types: Snuff  Vaping Use   Vaping Use: Never used  Substance Use Topics   Alcohol use: Yes    Alcohol/week: 2.0 standard drinks    Types: 2 Cans of beer per week    Comment: weekend    Home Medications Prior to Admission medications   Medication Sig Start Date End Date Taking? Authorizing Provider  divalproex (DEPAKOTE SPRINKLE) 125 MG capsule Take 4 capsules (500 mg total) by mouth every 12 (twelve) hours. 05/28/20   Armando Reichert, MD  QUEtiapine (SEROQUEL) 100 MG tablet Take 100 mg by mouth 2 (two) times daily. 09/05/19   [provider]  sertraline (ZOLOFT) 50 MG tablet Take 1.5 tablets (75 mg total) by mouth daily. 05/28/20   Armando Reichert, MD    Allergies    Patient has no known allergies.  Review of Systems   Review of Systems  Unable to perform ROS: Dementia   Physical Exam Updated Vital Signs BP (!) 131/105    Pulse (!) 122    Resp (!) 39    SpO2 (!) 70%   Physical Exam Vitals and nursing note reviewed.  Constitutional:      General: He is not in acute distress.    Appearance: He is well-developed.  HENT:     Head: Normocephalic and atraumatic.     Right Ear: Tympanic membrane and external ear normal.  Left Ear: External ear normal.  Eyes:     General:        Right eye: No discharge.        Left eye: No discharge.     Conjunctiva/sclera: Conjunctivae normal.  Cardiovascular:     Rate and Rhythm: Normal rate and regular rhythm.     Heart sounds: Normal heart sounds.  Pulmonary:     Effort: Respiratory distress present.     Breath sounds: Rhonchi and rales present.     Comments: Mild tachypnea.  Abdominal:     Palpations: Abdomen is soft.     Tenderness: There is no abdominal tenderness.  Musculoskeletal:     Cervical back: Normal range of motion and neck supple.  Skin:    General: Skin is warm and dry.  Neurological:     Mental Status: He is alert.    ED Results / Procedures / Treatments   Labs (all labs ordered are  listed, but only abnormal results are displayed) Labs Reviewed  CBC WITH DIFFERENTIAL/PLATELET - Abnormal; Notable for the following components:      Result Value   MCV 102.8 (*)    All other components within normal limits  LACTIC ACID, PLASMA - Abnormal; Notable for the following components:   Lactic Acid, Venous 6.8 (*)    All other components within normal limits  I-STAT VENOUS BLOOD GAS, ED - Abnormal; Notable for the following components:   pH, Ven 7.237 (*)    pO2, Ven 26.0 (*)    Acid-base deficit 4.0 (*)    Sodium 148 (*)    Calcium, Ion 1.13 (*)    All other components within normal limits  COMPREHENSIVE METABOLIC PANEL    EKG EKG Interpretation  Date/Time:  Thursday January 24 2021 16:10:11 EST Ventricular Rate:  124 PR Interval:  131 QRS Duration: 100 QT Interval:  331 QTC Calculation: 476 R Axis:   176 Text Interpretation: Sinus tachycardia Probable inferior infarct, recent Posterior infarct, acute (LCx) poor baseline Confirmed by Wandra Arthurs [phone]) on 01/22/2021 4:26:51 PM  Radiology DG Chest Port 1 View  Result Date: 01/20/2021 CLINICAL DATA:  Hypoxia.  Altered mental status. EXAM: PORTABLE CHEST 1 VIEW COMPARISON:  Radiographs 03/10/2020.  No other comparison studies. FINDINGS: 1620 hours. The heart size and mediastinal contours are grossly stable, without obvious adenopathy. There are new somewhat nodular airspace opacities throughout both lungs. No pneumothorax or significant pleural effusion is identified. The bones appear unchanged status post lower cervical fusion. IMPRESSION: New nodular airspace opacities in both lungs, most consistent with multilobar pneumonia. Given the nodular appearance, additional considerations would include septic emboli and metastatic disease. Followup PA and lateral chest X-ray is recommended in 3-4 weeks following appropriate therapy to ensure resolution and exclude underlying malignancy. Electronically Signed   By: Richardean Sale M.D.   On: 01/06/2021 16:27    Procedures Procedures   Medications Ordered in ED Medications  morphine 100mg  in NS 158mL (1mg /mL) infusion - premix (has no administration in time range)  vancomycin (VANCOCIN) IVPB 1000 mg/200 mL premix (has no administration in time range)  ceFEPIme (MAXIPIME) 1 g in sodium chloride 0.9 % 100 mL IVPB (has no administration in time range)    ED Course  I have reviewed the triage vital signs and the nursing notes.  Pertinent labs & imaging results that were available during my care of the patient were reviewed by me and considered in my medical decision making (see chart for details).  Patient seen and examined. Work-up initiated.   Vital signs reviewed and are as follows: BP (!) 131/105    Pulse (!) 122    Resp (!) 39    SpO2 (!) 70%   Patient with oxygen saturations in the 70s on nonrebreather mask.  I spoke with the patient's wife initially by telephone.  She was brought back to the room.  Confirmed DNR status.  Conversation with patient's wife, myself, Dr. Silverio Lay -- plan for comfort care.  We will give IV antibiotics and continue supplemental oxygen.  If patient decompensates, no plan for pressors or intubation.   5:17 PM Lactate 6.8.  No 30cc/kg, cultures as patient is at end of life, DNR/DNI with comfort measures.  Oxygen saturation still 70%.  Patient tachypneic and tachycardic.  Blood pressure normal.  Wife at bedside.  Children in route to the hospital.  Awaiting CMP and plan for admission to hospitalist for continued comfort measures.  6:38 PM Spoke with Dr. Chipper Herb of [facility] who will see patient. Status seems to be gradually worsening. Continuing comfort measures.  CRITICAL CARE Performed by: Renne Crigler PA-C Total critical care time: 45 minutes Critical care time was exclusive of separately billable procedures and treating other patients. Critical care was necessary to treat or prevent imminent or life-threatening  deterioration. Critical care was time spent personally by me on the following activities: development of treatment plan with patient and/or surrogate as well as nursing, discussions with consultants, evaluation of patient's response to treatment, examination of patient, obtaining history from patient or surrogate, ordering and performing treatments and interventions, ordering and review of laboratory studies, ordering and review of radiographic studies, pulse oximetry and re-evaluation of patient's condition.     MDM Rules/Calculators/A&P                          Admit, anticipate in hospital death.      Final Clinical Impression(s) / ED Diagnoses Final diagnoses:  Acute respiratory failure with hypoxia (HCC)  Aspiration pneumonia of both lungs, unspecified aspiration pneumonia type, unspecified part of lung (HCC)  Comfort measures only status    Rx / DC Orders ED Discharge Orders     None           Renne Crigler, PA-C February 14, 2021 1839    Charlynne Pander, MD 12/29/2020 1141

## 2021-01-25 DIAGNOSIS — A419 Sepsis, unspecified organism: Secondary | ICD-10-CM

## 2021-01-25 DIAGNOSIS — Z7189 Other specified counseling: Secondary | ICD-10-CM | POA: Diagnosis not present

## 2021-01-25 DIAGNOSIS — J189 Pneumonia, unspecified organism: Secondary | ICD-10-CM | POA: Diagnosis present

## 2021-01-25 DIAGNOSIS — Z515 Encounter for palliative care: Principal | ICD-10-CM

## 2021-01-25 DIAGNOSIS — R6521 Severe sepsis with septic shock: Secondary | ICD-10-CM

## 2021-01-25 DIAGNOSIS — J80 Acute respiratory distress syndrome: Secondary | ICD-10-CM | POA: Diagnosis not present

## 2021-01-25 DIAGNOSIS — F0280 Dementia in other diseases classified elsewhere without behavioral disturbance: Secondary | ICD-10-CM

## 2021-01-25 DIAGNOSIS — E663 Overweight: Secondary | ICD-10-CM | POA: Diagnosis present

## 2021-01-25 DIAGNOSIS — G300 Alzheimer's disease with early onset: Secondary | ICD-10-CM

## 2021-01-25 DIAGNOSIS — J9601 Acute respiratory failure with hypoxia: Secondary | ICD-10-CM | POA: Diagnosis not present

## 2021-01-27 NOTE — Progress Notes (Signed)
Nurse called into room, no chest rises noted ,at auscultation no heart beat noted, second nurse confirmed. Family at bedside, Morphine drip stopped and wasted with staff. All required documents completed and patient placement notified. Honor bridge notified not a candidate. Care rendered by staff and transported to morgue.

## 2021-01-27 NOTE — Death Summary Note (Signed)
DEATH SUMMARY   Patient Details  Name: Nathan Schroeder MRN: [id number] DOB: 07-Oct-1960 PCP:Pcp, No  Admission/Discharge Information   Admit Date:  30-Jan-2021  Date of Death: 01/31/2021  Time of Death: 8:30 AM  Length of Stay: 1   Principle Cause of death: Septic shock from pneumonia causing ARDS/acute respiratory failure with hypoxia  Hospital Diagnoses: Principal Problem:   Septic shock (HCC) Active Problems:   Acute respiratory failure with hypoxia (HCC)   ARDS (adult respiratory distress syndrome) (HCC)   Pneumonia   Early onset Alzheimer's dementia (HCC)   Essential hypertension   Overweight (BMI 25.0-29.9)   Hospital Course: 61 year old male with past medical history of early onset Alzheimer's dementia, hypertension and anxiety/depression brought in from his nursing facility with obtundation and hypoxia on the evening of January 31, 2023.  Chest x-ray noted bilateral multifocal pneumonia and ARDS with ABG noting profound hypoxia, Requiring 50% nonrebreather.  lactic acid level greater than 6.  Given patient's dementia already and DNR status, it was discussed with family how gravely ill he was.  Family decided to make patient comfort care and he was placed on morphine drip and brought in the hospital service.  Patient passed away on morning of 2023/02/01 at 8:30 AM.  Procedures: None  Consultations: None  The results of significant diagnostics from this hospitalization (including imaging, microbiology, ancillary and laboratory) are listed below for reference.   Significant Diagnostic Studies: DG Chest Port 1 View  Result Date: 30-Jan-2021 CLINICAL DATA:  Hypoxia.  Altered mental status. EXAM: PORTABLE CHEST 1 VIEW COMPARISON:  Radiographs 03/10/2020.  No other comparison studies. FINDINGS: 1620 hours. The heart size and mediastinal contours are grossly stable, without obvious adenopathy. There are new somewhat nodular airspace opacities throughout both lungs. No pneumothorax or  significant pleural effusion is identified. The bones appear unchanged status post lower cervical fusion. IMPRESSION: New nodular airspace opacities in both lungs, most consistent with multilobar pneumonia. Given the nodular appearance, additional considerations would include septic emboli and metastatic disease. Followup PA and lateral chest X-ray is recommended in 3-4 weeks following appropriate therapy to ensure resolution and exclude underlying malignancy. Electronically Signed   By: Carey Bullocks M.D.   On: 01/30/2021 16:27    Microbiology: Recent Results (from the past 240 hour(s))  Resp Panel by RT-PCR (Flu A&B, Covid) Nasopharyngeal Swab     Status: None   Collection Time: Jan 30, 2021  8:16 PM   Specimen: Nasopharyngeal Swab; Nasopharyngeal(NP) swabs in vial transport medium  Result Value Ref Range Status   SARS Coronavirus 2 by RT PCR NEGATIVE NEGATIVE Final    Comment: (NOTE) SARS-CoV-2 target nucleic acids are NOT DETECTED.  The SARS-CoV-2 RNA is generally detectable in upper respiratory specimens during the acute phase of infection. The lowest concentration of SARS-CoV-2 viral copies this assay can detect is 138 copies/mL. A negative result does not preclude SARS-Cov-2 infection and should not be used as the sole basis for treatment or other patient management decisions. A negative result may occur with  improper specimen collection/handling, submission of specimen other than nasopharyngeal swab, presence of viral mutation(s) within the areas targeted by this assay, and inadequate number of viral copies(<138 copies/mL). A negative result must be combined with clinical observations, patient history, and epidemiological information. The expected result is Negative.  Fact Sheet for Patients:  BloggerCourse.com  Fact Sheet for Healthcare Providers:  SeriousBroker.it  This test is no t yet approved or cleared by the [place] and  has been authorized for  detection and/or diagnosis of SARS-CoV-2 by FDA under an Emergency Use Authorization (EUA). This EUA will remain  in effect (meaning this test can be used) for the duration of the COVID-19 declaration under Section 564(b)(1) of the Act, 21 U.S.C.section 360bbb-3(b)(1), unless the authorization is terminated  or revoked sooner.       Influenza A by PCR NEGATIVE NEGATIVE Final   Influenza B by PCR NEGATIVE NEGATIVE Final    Comment: (NOTE) The Xpert Xpress SARS-CoV-2/FLU/RSV plus assay is intended as an aid in the diagnosis of influenza from Nasopharyngeal swab specimens and should not be used as a sole basis for treatment. Nasal washings and aspirates are unacceptable for Xpert Xpress SARS-CoV-2/FLU/RSV testing.  Fact Sheet for Patients: BloggerCourse.com  Fact Sheet for Healthcare Providers: SeriousBroker.it  This test is not yet approved or cleared by the [place] FDA and has been authorized for detection and/or diagnosis of SARS-CoV-2 by FDA under an Emergency Use Authorization (EUA). This EUA will remain in effect (meaning this test can be used) for the duration of the COVID-19 declaration under Section 564(b)(1) of the Act, 21 U.S.C. section 360bbb-3(b)(1), unless the authorization is terminated or revoked.  Performed at [facility], [address] [place], [place] [address]     Time spent: 25 minutes  Signed: Hollice Espy 2021/01/26

## 2021-01-27 NOTE — Consult Note (Signed)
Palliative Medicine Inpatient Consult Note  Consulting Provider: Lequita Halt, MD  Reason for consult:   [facility] Palliative Medicine Consult  Reason for Consult? ARDS   HPI:  Per intake H&P --> Nathan Schroeder is a 61 y.o. male with medical history significant of early onset of Alzheimer dementia, HTN, anxiety/depression was brought in from nursing home for evaluation of altered mentation and hypoxia.   Per EMS report, patient was sitting in the recliner and appeared that patient might have aspiration event and became unresponsive.  Pulse ox showed O2 saturation 50%, and nursing home staff started bag-valve-mask ventilation with improvement to lower 90s.  Palliative care has been asked to get involved to aid in ongoing symptom management discussed possible transition to hospice.  Per chart review Nathan Schroeder is known to our palliative care service and has been referred to Nathan Schroeder.  It appears that their outpatient palliative team has been following along with Nathan Schroeder though he has not formally been enrolled in their hospice services.  Clinical Assessment/Goals of Care:  *Please note that this is a verbal dictation therefore any spelling or grammatical errors are due to the "[place] [facility]" system interpretation.  I have reviewed medical records including EPIC notes, labs and imaging, received report from bedside RN, assessed the patient who is on nonrebreather facemask unresponsive has even unlabored breathing.    I met with patient's spouse, Nathan Schroeder and daughter Nathan Schroeder to further discuss diagnosis prognosis, Nathan Schroeder, EOL wishes, disposition and options.  Reviewed Toretto's past medical history of early onset Alzheimer's dementia, anxiety and depression.  Had a comprehensive discussion regarding the past year and the declines that Amarri has endured inclusive of a greater than 100 pound weight loss.  Reviewed with patient's wife her difficulties coming to terms  with her inability to care for Nathan Schroeder leading to him being placed at [facility] skilled nursing facility.   I introduced Palliative Medicine as specialized medical care for people living with serious illness. It focuses on providing relief from the symptoms and stress of a serious illness. The goal is to improve quality of life for both the patient and the family.  Nathan Schroeder is from [place].  He and his wife met in high school at the age of 61.  They both went together to [employer] and got married.  They have been together for over 46 years.  They share 1 daughter, Nathan Schroeder and 2 sons, Nathan Schroeder.  They have 2 grandchildren with 1 on the way.  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  He was later at the age of 23 diagnosed with early onset Alzheimer's type dementia.  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  He is a man of faith and is a member of [employer].  Prior to admission Nathan Schroeder had been living at [facility] where he had been reliant upon care for all basic activities of daily living.  He had been declining over the last year significantly to now the point where he is nonfocal, not ambulatory unfortunately leading to skin injury.  Discussed events reason for admission being that he was in acute respiratory distress at which time family opted for comfort oriented focus of care.  We reviewed that Javis was still on the nonrebreather facemask which may prolong his  time on earth and potentially undue suffering.  After conversation with he and his family we determined that the best next step would be to remove this intervention.  Palliative care and support were provided at bedside.  Therapeutic listening was provided as a layer of emotional support.  Discussed the importance of continued conversation with  family and their  medical providers regarding overall plan of care and treatment options, ensuring decisions are within the context of the patients values and GOCs.  Decision Maker: Nathan Schroeder (Spouse): [phone]  SUMMARY OF RECOMMENDATIONS   DNAR/DNI  Comfort care -on morphine drip for symptom burden  Has been removed from nonrebreather facemask  Unrestricted visitation  Anticipate in-hospital death within hours  Ongoing palliative care support  Code Status/Advance Care Planning: DNAR/DNI   Palliative Prophylaxis:  Oral care, mobility  Additional Recommendations (Limitations, Scope, Preferences): Continue comfort focused care    Psycho-social/Spiritual:  Desire for further Chaplaincy support: Yes Additional Recommendations: Education on end-of-life processes   Prognosis: Limited 2 hours  Discharge Planning: Discharge will be Nathan Schroeder  Vitals:   01/14/2021 2100 01/14/2021 2148  BP:  (!) 56/41  Pulse: 100 (!) 108  Resp: (!) 21 20  Temp:  97.8 F (36.6 C)  SpO2: 97% (!) 66%    Intake/Output Summary (Last 24 hours) at 2021/02/08 0753 Last data filed at 08-Feb-2021 0300 Gross per 24 hour  Intake 51.18 ml  Output --  Net 51.18 ml    Gen: Very frail elderly Caucasian male in no acute distress HEENT: Dry mucous membranes CV: Irregular rate and rhythm PULM: Nonrebreather facemask --> which was later removed ABD: soft/nontender EXT: No edema Neuro: Somnolent  PPS: 10%   This conversation/these recommendations were discussed with patient primary care team, Dr. [place] Pink  Time In: 0650 Time Out: 0800 Total Time: 82 Greater than 50%  of this time was spent counseling and coordinating care related to the above assessment and plan.  [place] Team Team Cell Phone: [phone] Please utilize secure chat with additional questions, if there is no response within 30 minutes please call the above phone  number  Palliative Medicine Team providers are available by phone from 7am to 7pm daily and can be reached through the team cell phone.  Should this patient require assistance outside of these hours, please call the patient's attending physician.

## 2021-01-27 DEATH — deceased

## 2022-10-30 IMAGING — CT CT HEAD W/O CM
4 series · 15 of 47 positions shown, 17 images · non-contrast
Comparison: None.

CLINICAL DATA: Mental status change.  Patient with known dementia.

EXAM:
CT HEAD WITHOUT CONTRAST
TECHNIQUE: Contiguous axial images were obtained from the base of the skull
through the vertex without intravenous contrast.

[Series 3: head without · axial · non-contrast · 0.50mm/px · z∈[-126,+14]mm · 7 of 38 slices shown, 9 images]
[im 5/38  brain]
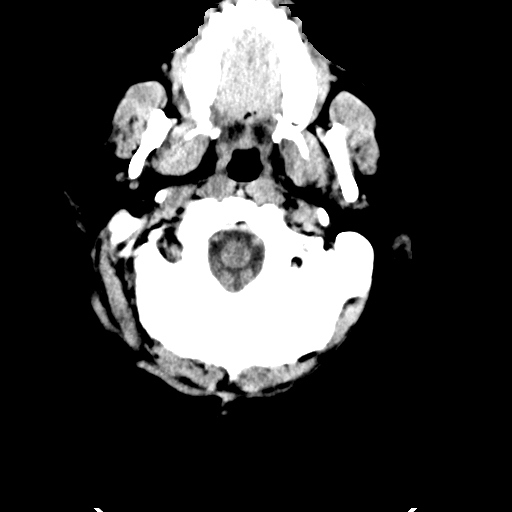
[im 5/38  bone]
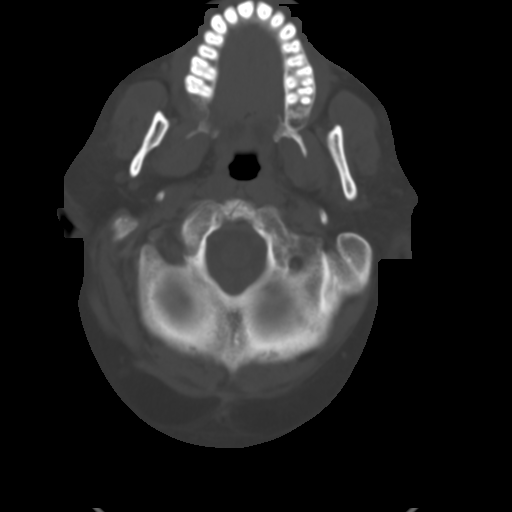
[im 10/38  brain]
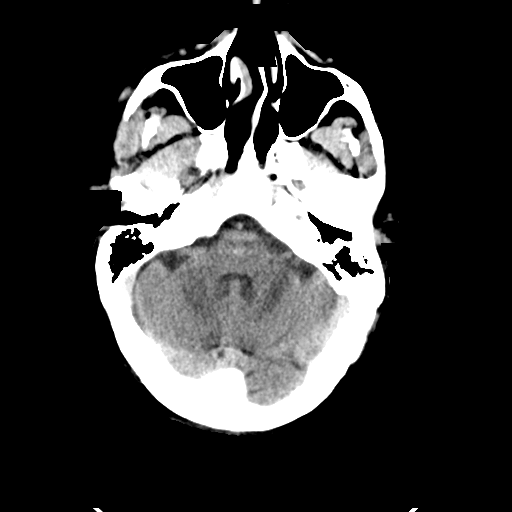
[im 14/38  brain]
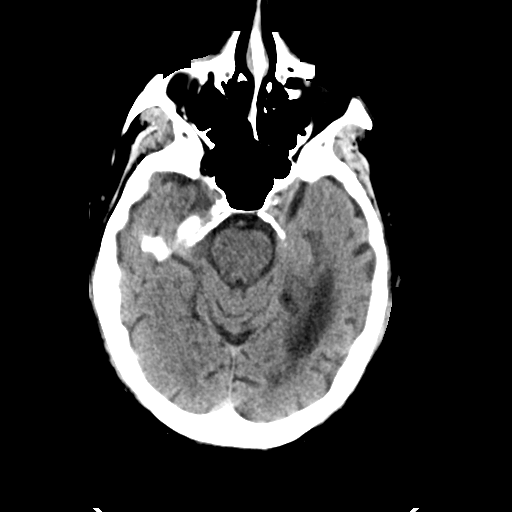
[im 19/38  brain]
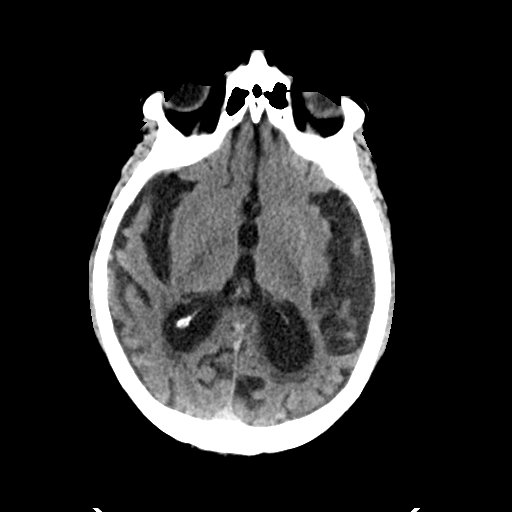
[im 24/38  brain]
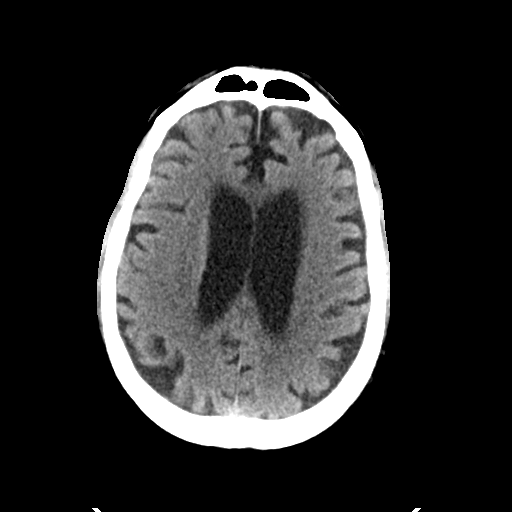
[im 24/38  bone]
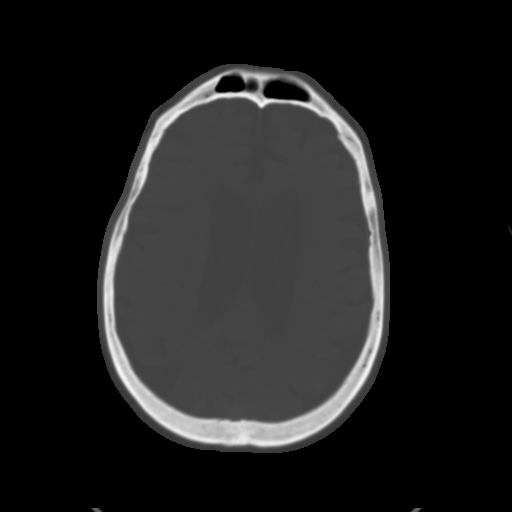
[im 28/38  brain]
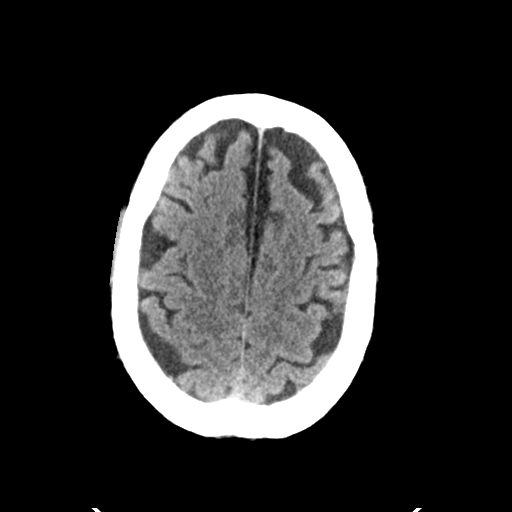
[im 33/38  brain]
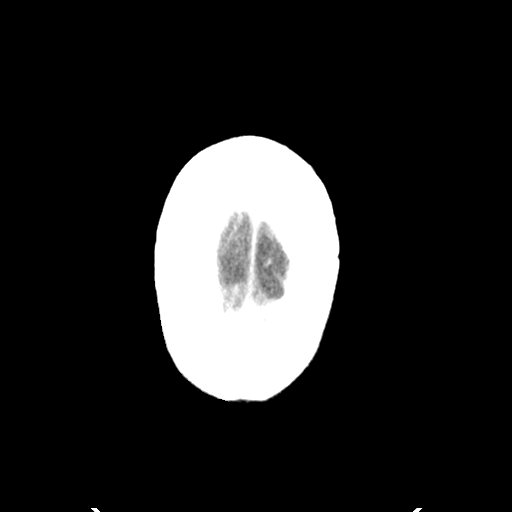

[Series 4: head bone · axial · 0.50mm/px · z∈[-128,-110]mm · 2 of 95 slices shown]
[im 10/95  bone]
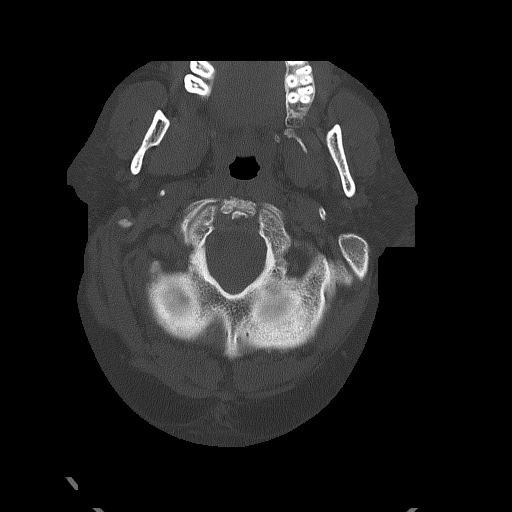
[im 19/95  bone]
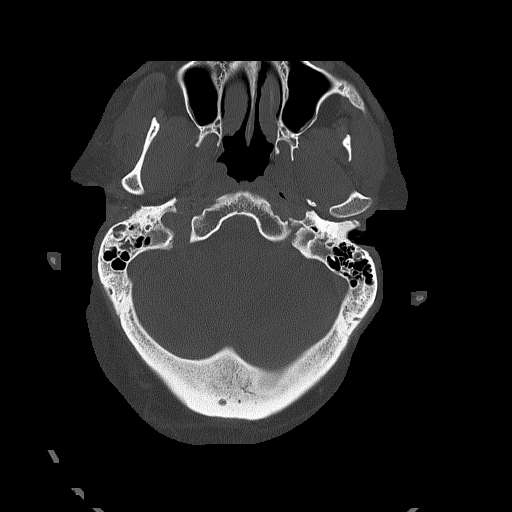

[Series 5: head without cor · coronal · non-contrast · 0.37mm/px · 3 of 75 slices shown]
[im 25/75  brain]
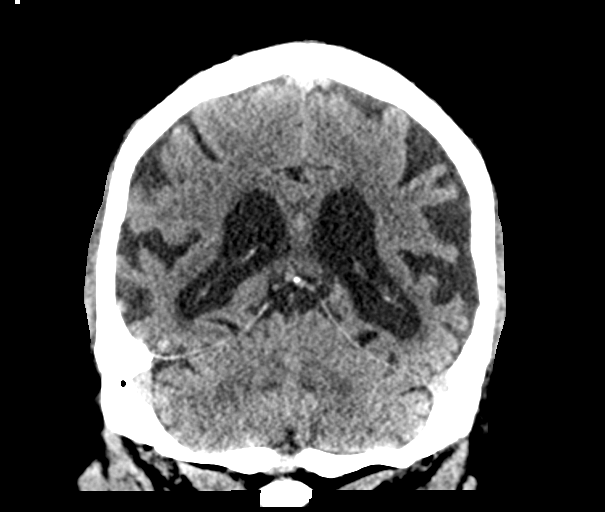
[im 33/75  brain]
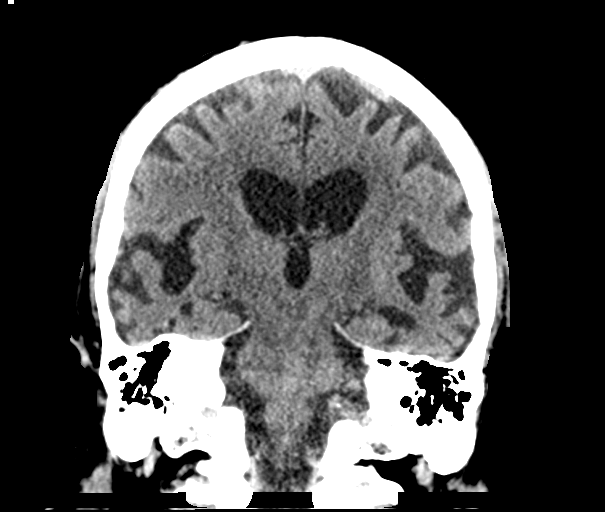
[im 42/75  brain]
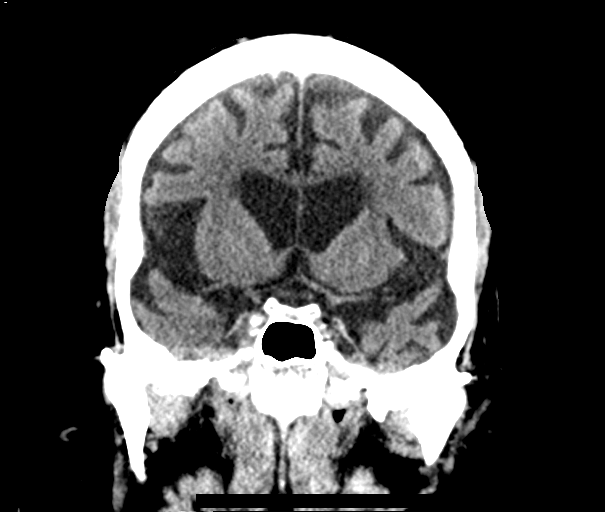

[Series 6: head without sag · sagittal · non-contrast · 0.37mm/px · 3 of 67 slices shown]
[im 23/67  brain]
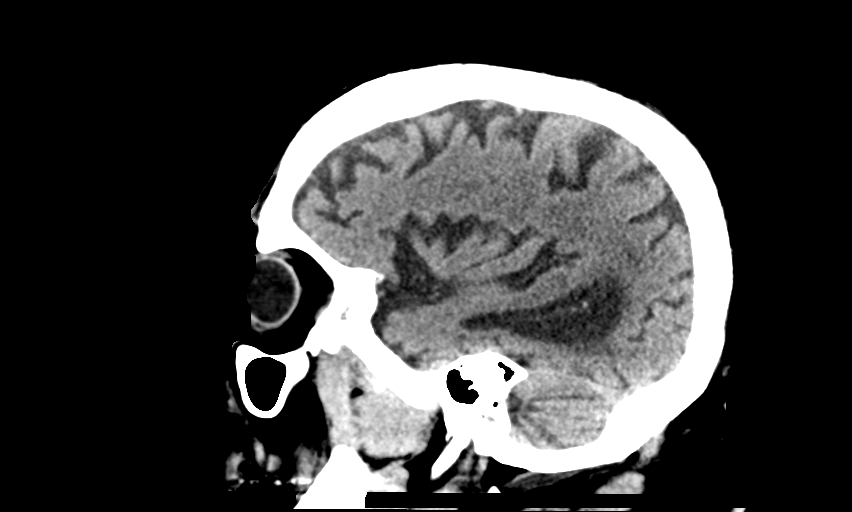
[im 34/67  brain]
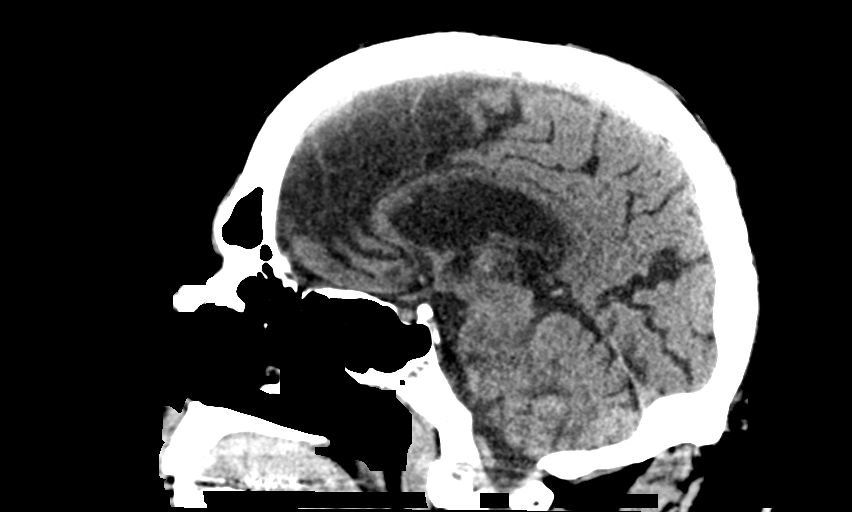
[im 45/67  brain]
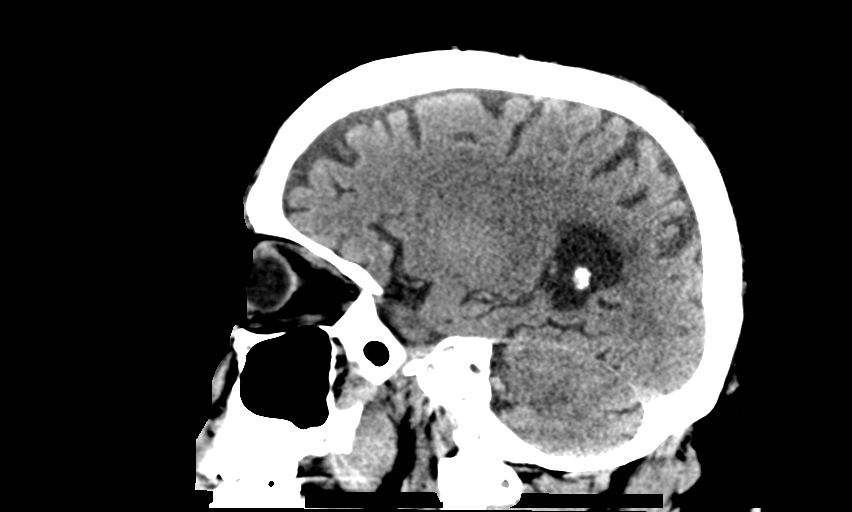

[15 of 47 positions shown; findings below may reference images not displayed]

FINDINGS: Brain: No evidence of acute infarction, hemorrhage, hydrocephalus,
extra-axial collection or mass lesion/mass effect.

There is ventricular sulcal enlargement reflecting moderate diffuse
atrophy, advanced for age. Mild periventricular white matter
hypoattenuation is noted consistent with chronic microvascular
ischemic change.

Vascular: No hyperdense vessel or unexpected calcification.

Skull: Normal. Negative for fracture or focal lesion.

Sinuses/Orbits: Globes and orbits are unremarkable. Sinuses are
clear.

Other: None.
IMPRESSION: 1. No acute intracranial abnormalities.
2. Moderate atrophy, advanced for age. Mild chronic microvascular
ischemic change.

## 2022-10-30 IMAGING — DX DG CHEST 1V PORT
1 series · 1 of 1 positions shown · non-contrast
Comparison: Cyst

CLINICAL DATA: Altered mental status.

EXAM:
PORTABLE CHEST 1 VIEW

[chest ap]
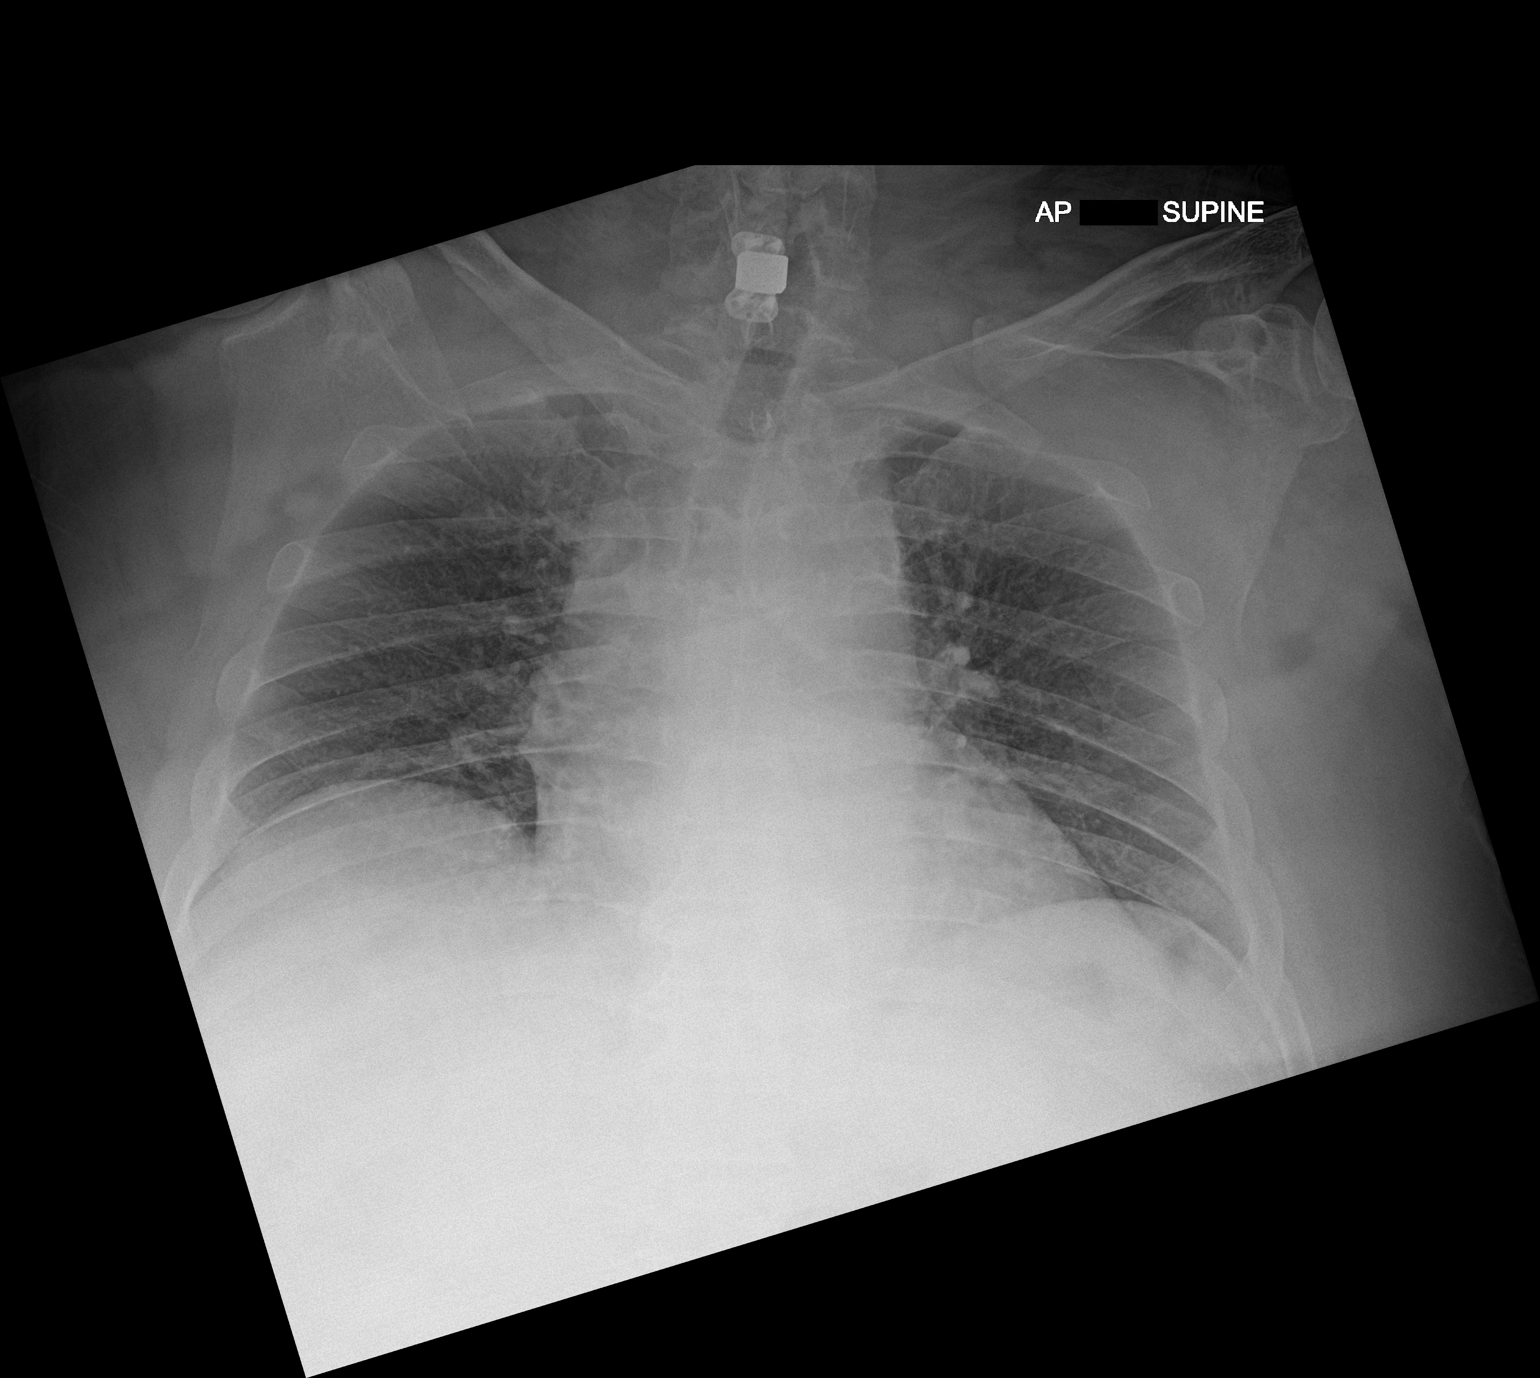

[1 of 1 positions shown; findings below may reference images not displayed]

FINDINGS: The heart size and mediastinal contours are within normal limits.
Low lung volumes. Both lungs are clear. The visualized skeletal
structures are unremarkable.
IMPRESSION: 1. No active disease.

## 2022-11-06 IMAGING — CT CT RENAL STONE PROTOCOL
2 of 4 series · 17 of 46 positions shown, 19 images · non-contrast
Comparison: None.

CLINICAL DATA: 59-year-old male with acute pain and urinary
retention.

EXAM:
CT ABDOMEN AND PELVIS WITHOUT CONTRAST
TECHNIQUE: Multidetector CT imaging of the abdomen and pelvis was performed
following the standard protocol without IV contrast.

[Series 3: ap without · axial · non-contrast · 0.98mm/px · z∈[+903,+1413]mm · 14 of 114 slices shown, 16 images]
[im 6/114  soft-tissue]
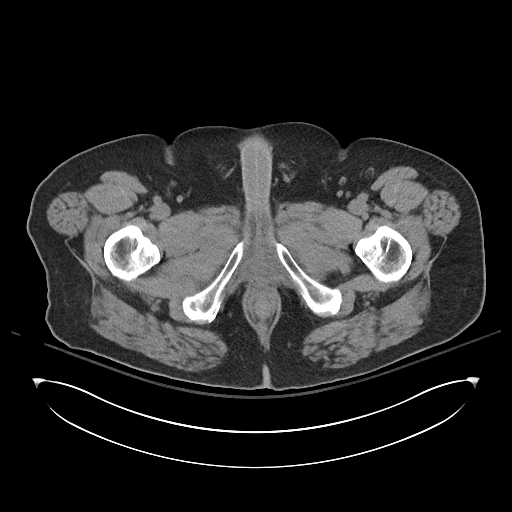
[im 6/114  bone]
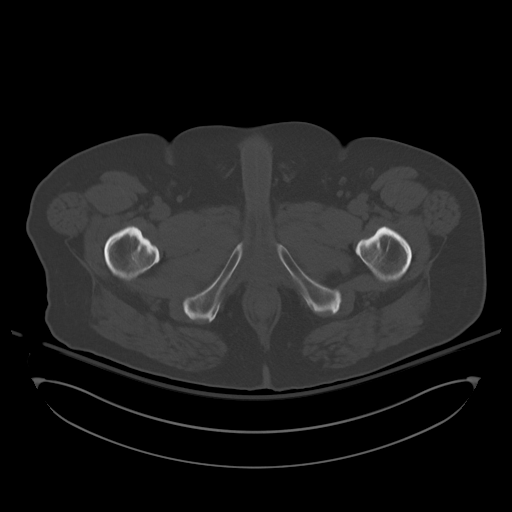
[im 17/114  soft-tissue]
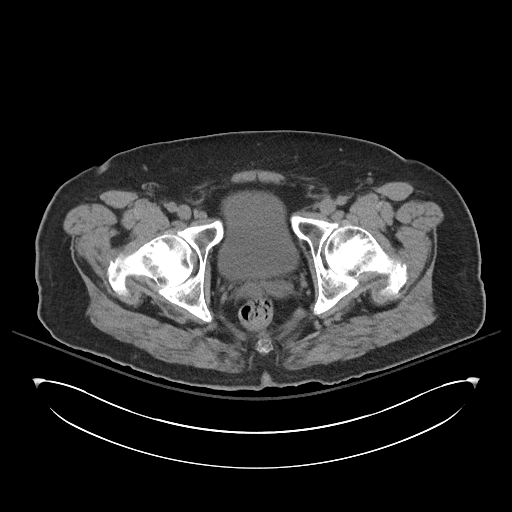
[im 23/114  soft-tissue]
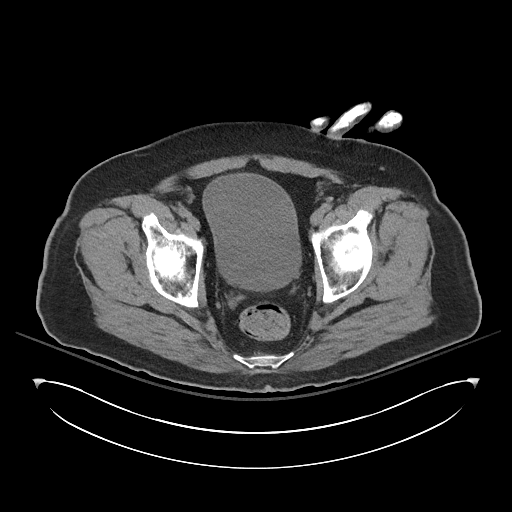
[im 29/114  soft-tissue]
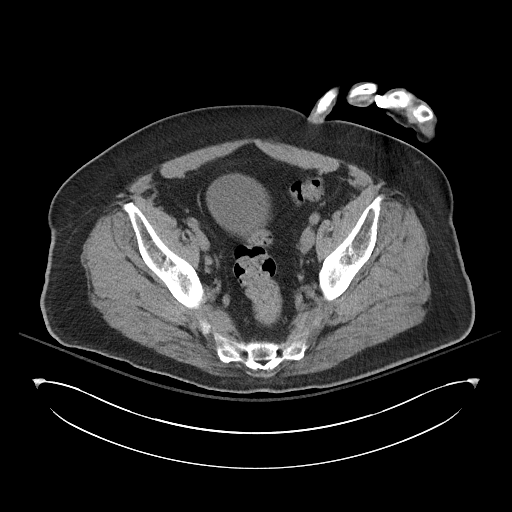
[im 40/114  soft-tissue]
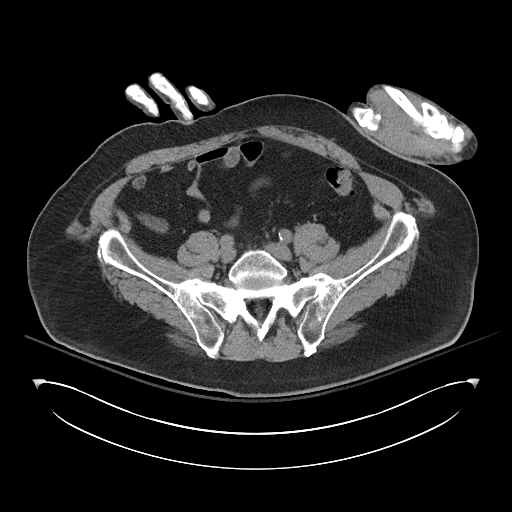
[im 46/114  soft-tissue]
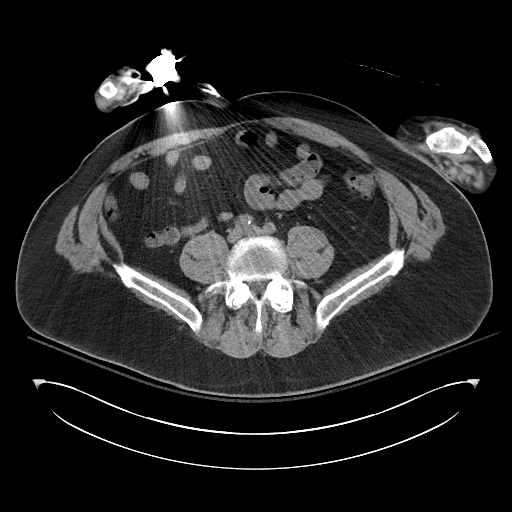
[im 51/114  soft-tissue]
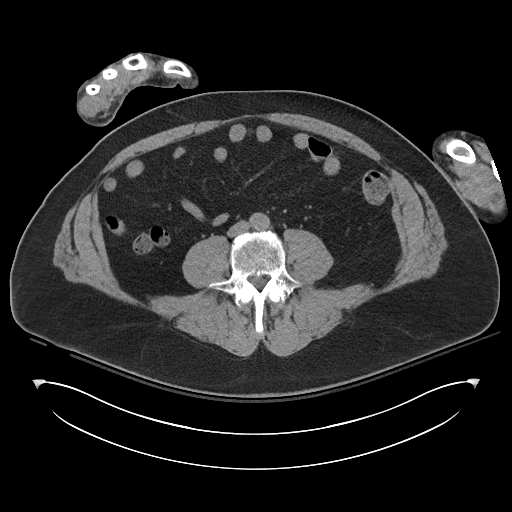
[im 63/114  soft-tissue]
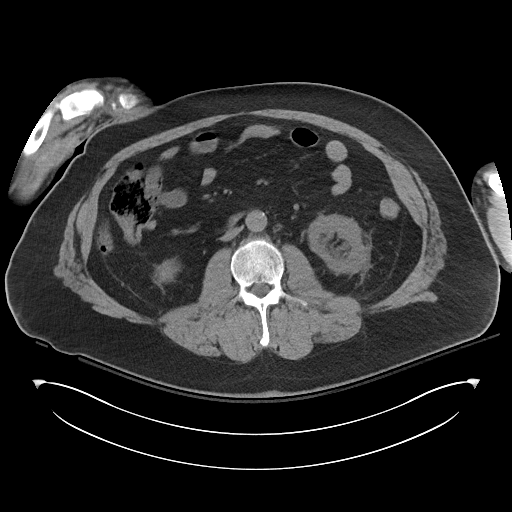
[im 68/114  soft-tissue]
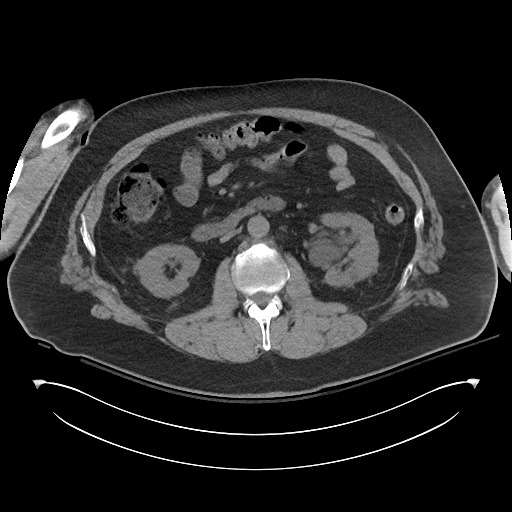
[im 68/114  bone]
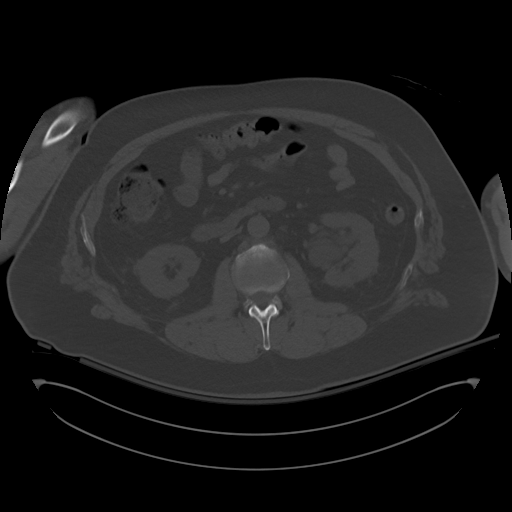
[im 74/114  soft-tissue]
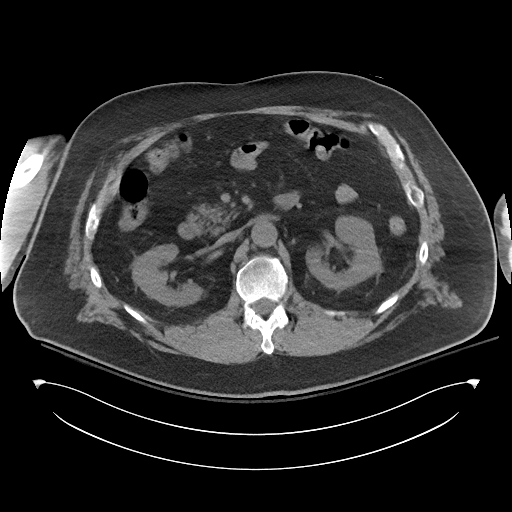
[im 85/114  soft-tissue]
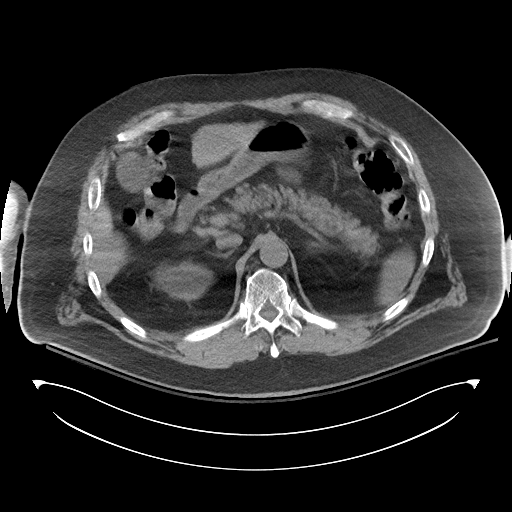
[im 91/114  soft-tissue]
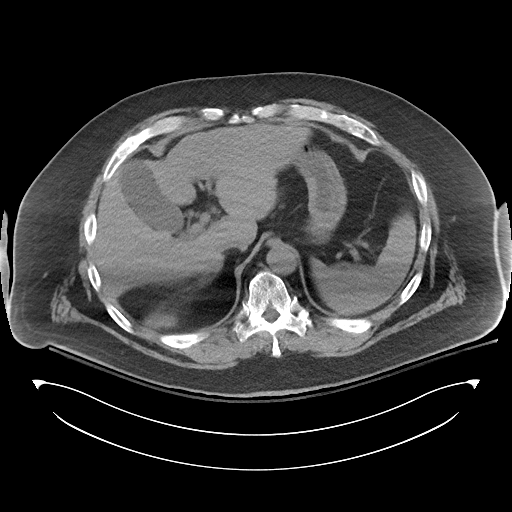
[im 97/114  soft-tissue]
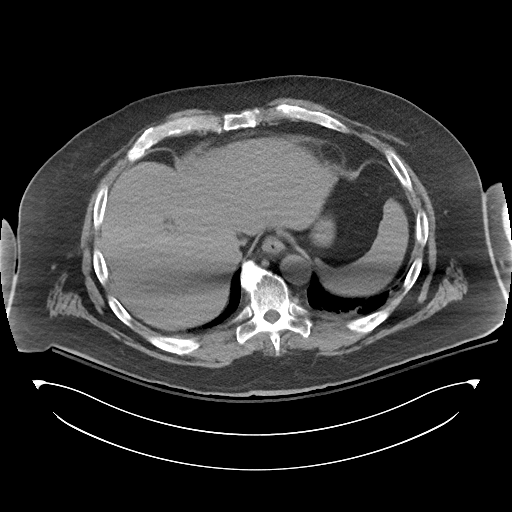
[im 108/114  soft-tissue]
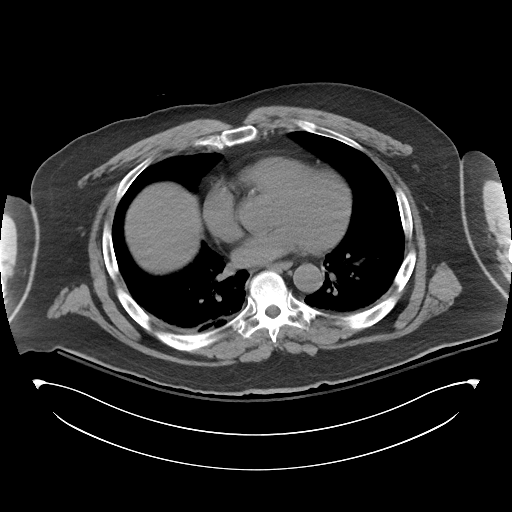

[Series 6: cor · coronal · 1.06mm/px · 3 of 113 slices shown]
[im 38/113  soft-tissue]
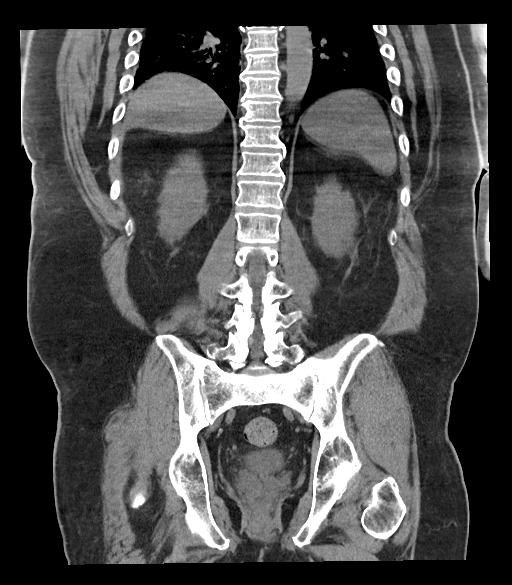
[im 50/113  soft-tissue]
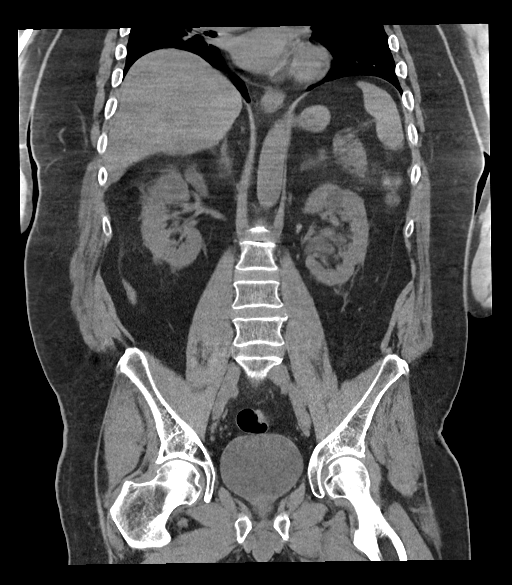
[im 63/113  soft-tissue]
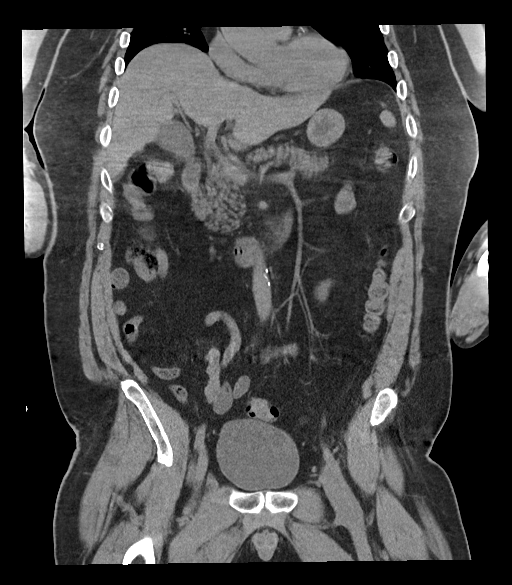

[17 of 46 positions shown; findings below may reference images not displayed]

FINDINGS: Please note that parenchymal abnormalities may be missed without
intravenous contrast.

Lower chest: Mild bibasilar atelectasis/scarring is identified.
Coronary artery atherosclerotic calcifications are identified.

Hepatobiliary: The liver and gallbladder are unremarkable. No
biliary dilatation.

Pancreas: Unremarkable

Spleen: Unremarkable

Adrenals/Urinary Tract: Mild bilateral perinephric stranding is of
uncertain chronicity. There is no evidence of hydronephrosis or
urinary calculi. LEFT parapelvic cysts are present. The adrenal
glands and bladder are unremarkable.

Stomach/Bowel: Stomach is within normal limits. Appendix appears
normal. No evidence of bowel wall thickening, distention, or
inflammatory changes.

Vascular/Lymphatic: Aortic atherosclerosis. No enlarged abdominal or
pelvic lymph nodes.

Reproductive: Prostate is unremarkable.

Other: No ascites, pneumoperitoneum or focal collection.

Musculoskeletal: No acute or suspicious bony abnormalities.
IMPRESSION: 1. Mild bilateral perinephric stranding of uncertain chronicity. No
evidence of hydronephrosis or urinary calculi.
2. Coronary artery disease.
3. Aortic Atherosclerosis ([id number]).
